# Patient Record
Sex: Female | Born: 1960 | Race: Black or African American | Hispanic: No | Marital: Married | Smoking: Never smoker
Health system: Southern US, Community
[De-identification: ages and names within clinical notes are randomized; demographics above are authoritative.]

## PROBLEM LIST (undated history)

## (undated) DIAGNOSIS — D649 Anemia, unspecified: Secondary | ICD-10-CM

## (undated) DIAGNOSIS — F419 Anxiety disorder, unspecified: Secondary | ICD-10-CM

## (undated) DIAGNOSIS — F32A Depression, unspecified: Secondary | ICD-10-CM

## (undated) DIAGNOSIS — I10 Essential (primary) hypertension: Secondary | ICD-10-CM

## (undated) DIAGNOSIS — M1990 Unspecified osteoarthritis, unspecified site: Secondary | ICD-10-CM

## (undated) DIAGNOSIS — F329 Major depressive disorder, single episode, unspecified: Secondary | ICD-10-CM

## (undated) DIAGNOSIS — E785 Hyperlipidemia, unspecified: Secondary | ICD-10-CM

## (undated) HISTORY — PX: LUMBAR LAMINECTOMY: SHX95

## (undated) HISTORY — DX: Depression, unspecified: F32.A

## (undated) HISTORY — PX: CHOLECYSTECTOMY: SHX55

## (undated) HISTORY — DX: Hyperlipidemia, unspecified: E78.5

## (undated) HISTORY — DX: Anemia, unspecified: D64.9

## (undated) HISTORY — DX: Anxiety disorder, unspecified: F41.9

## (undated) HISTORY — DX: Major depressive disorder, single episode, unspecified: F32.9

## (undated) HISTORY — DX: Unspecified osteoarthritis, unspecified site: M19.90

## (undated) HISTORY — DX: Essential (primary) hypertension: I10

---

## 2001-01-13 ENCOUNTER — Encounter: Payer: Self-pay | Admitting: Internal Medicine

## 2001-01-13 ENCOUNTER — Ambulatory Visit (HOSPITAL_COMMUNITY): Admission: RE | Admit: 2001-01-13 | Discharge: 2001-01-13 | Payer: Self-pay | Admitting: Internal Medicine

## 2001-03-23 ENCOUNTER — Encounter (INDEPENDENT_AMBULATORY_CARE_PROVIDER_SITE_OTHER): Payer: Self-pay

## 2001-03-23 ENCOUNTER — Ambulatory Visit (HOSPITAL_COMMUNITY): Admission: RE | Admit: 2001-03-23 | Discharge: 2001-03-23 | Payer: Self-pay | Admitting: Obstetrics and Gynecology

## 2001-03-23 ENCOUNTER — Encounter: Payer: Self-pay | Admitting: Obstetrics and Gynecology

## 2001-05-01 ENCOUNTER — Ambulatory Visit: Admission: RE | Admit: 2001-05-01 | Discharge: 2001-05-01 | Payer: Self-pay | Admitting: Internal Medicine

## 2001-08-01 ENCOUNTER — Emergency Department (HOSPITAL_COMMUNITY): Admission: EM | Admit: 2001-08-01 | Discharge: 2001-08-01 | Payer: Self-pay | Admitting: Emergency Medicine

## 2002-11-21 ENCOUNTER — Encounter: Payer: Self-pay | Admitting: Emergency Medicine

## 2002-11-21 ENCOUNTER — Emergency Department (HOSPITAL_COMMUNITY): Admission: EM | Admit: 2002-11-21 | Discharge: 2002-11-21 | Payer: Self-pay | Admitting: Emergency Medicine

## 2004-01-21 ENCOUNTER — Ambulatory Visit (HOSPITAL_COMMUNITY): Admission: RE | Admit: 2004-01-21 | Discharge: 2004-01-22 | Payer: Self-pay | Admitting: Neurosurgery

## 2004-05-22 ENCOUNTER — Ambulatory Visit: Payer: Self-pay | Admitting: Internal Medicine

## 2004-06-02 ENCOUNTER — Ambulatory Visit: Payer: Self-pay | Admitting: Internal Medicine

## 2004-09-17 ENCOUNTER — Emergency Department (HOSPITAL_COMMUNITY): Admission: EM | Admit: 2004-09-17 | Discharge: 2004-09-18 | Payer: Self-pay | Admitting: Emergency Medicine

## 2004-10-30 ENCOUNTER — Ambulatory Visit: Payer: Self-pay | Admitting: Internal Medicine

## 2004-11-09 ENCOUNTER — Ambulatory Visit: Payer: Self-pay | Admitting: Internal Medicine

## 2005-01-04 ENCOUNTER — Emergency Department (HOSPITAL_COMMUNITY): Admission: EM | Admit: 2005-01-04 | Discharge: 2005-01-04 | Payer: Self-pay | Admitting: Emergency Medicine

## 2005-01-06 ENCOUNTER — Ambulatory Visit: Payer: Self-pay | Admitting: Internal Medicine

## 2005-02-19 ENCOUNTER — Ambulatory Visit: Payer: Self-pay | Admitting: Internal Medicine

## 2005-02-26 ENCOUNTER — Ambulatory Visit: Payer: Self-pay | Admitting: Internal Medicine

## 2005-03-04 ENCOUNTER — Ambulatory Visit: Payer: Self-pay | Admitting: Internal Medicine

## 2005-09-22 ENCOUNTER — Ambulatory Visit: Payer: Self-pay | Admitting: Endocrinology

## 2005-09-27 ENCOUNTER — Ambulatory Visit: Payer: Self-pay | Admitting: Internal Medicine

## 2005-10-01 ENCOUNTER — Ambulatory Visit: Payer: Self-pay | Admitting: Internal Medicine

## 2005-11-08 ENCOUNTER — Ambulatory Visit: Payer: Self-pay | Admitting: Internal Medicine

## 2005-12-18 ENCOUNTER — Ambulatory Visit: Payer: Self-pay | Admitting: Family Medicine

## 2006-04-06 ENCOUNTER — Ambulatory Visit: Payer: Self-pay | Admitting: Internal Medicine

## 2006-07-25 ENCOUNTER — Ambulatory Visit: Payer: Self-pay | Admitting: Internal Medicine

## 2007-02-22 ENCOUNTER — Encounter: Payer: Self-pay | Admitting: Internal Medicine

## 2007-02-22 DIAGNOSIS — I10 Essential (primary) hypertension: Secondary | ICD-10-CM | POA: Insufficient documentation

## 2007-04-12 ENCOUNTER — Encounter: Payer: Self-pay | Admitting: Internal Medicine

## 2007-04-12 ENCOUNTER — Ambulatory Visit: Payer: Self-pay | Admitting: Internal Medicine

## 2007-04-12 DIAGNOSIS — F411 Generalized anxiety disorder: Secondary | ICD-10-CM | POA: Insufficient documentation

## 2007-04-12 DIAGNOSIS — D509 Iron deficiency anemia, unspecified: Secondary | ICD-10-CM | POA: Insufficient documentation

## 2007-04-12 DIAGNOSIS — F329 Major depressive disorder, single episode, unspecified: Secondary | ICD-10-CM

## 2007-04-12 DIAGNOSIS — J309 Allergic rhinitis, unspecified: Secondary | ICD-10-CM | POA: Insufficient documentation

## 2007-04-12 DIAGNOSIS — F32A Depression, unspecified: Secondary | ICD-10-CM | POA: Insufficient documentation

## 2007-04-12 DIAGNOSIS — F3289 Other specified depressive episodes: Secondary | ICD-10-CM | POA: Insufficient documentation

## 2007-04-12 LAB — CONVERTED CEMR LAB
ALT: 12 units/L (ref 0–35)
Basophils Absolute: 0 10*3/uL (ref 0.0–0.1)
Bilirubin, Direct: 0.1 mg/dL (ref 0.0–0.3)
CO2: 27 meq/L (ref 19–32)
Calcium: 9.4 mg/dL (ref 8.4–10.5)
Chloride: 107 meq/L (ref 96–112)
Eosinophils Absolute: 0.1 10*3/uL (ref 0.0–0.6)
Eosinophils Relative: 1.4 % (ref 0.0–5.0)
GFR calc Af Amer: 87 mL/min
Glucose, Bld: 113 mg/dL — ABNORMAL HIGH (ref 70–99)
Leukocytes, UA: NEGATIVE
MCHC: 31.9 g/dL (ref 30.0–36.0)
MCV: 68.1 fL — ABNORMAL LOW (ref 78.0–100.0)
Microalb Creat Ratio: 9.7 mg/g (ref 0.0–30.0)
Microalb, Ur: 1.7 mg/dL (ref 0.0–1.9)
Neutro Abs: 3.3 10*3/uL (ref 1.4–7.7)
Neutrophils Relative %: 62.2 % (ref 43.0–77.0)
Nitrite: NEGATIVE
Platelets: 327 10*3/uL (ref 150–400)
Potassium: 4 meq/L (ref 3.5–5.1)
RBC: 5.15 M/uL — ABNORMAL HIGH (ref 3.87–5.11)
RDW: 14.6 % (ref 11.5–14.6)
Specific Gravity, Urine: 1.02 (ref 1.000–1.03)
TSH: 1.71 microintl units/mL (ref 0.35–5.50)
Total Protein, Urine: NEGATIVE mg/dL
Triglycerides: 107 mg/dL (ref 0–149)
Urine Glucose: NEGATIVE mg/dL
Urobilinogen, UA: 0.2 (ref 0.0–1.0)

## 2007-07-26 ENCOUNTER — Ambulatory Visit: Payer: Self-pay | Admitting: Internal Medicine

## 2007-07-27 DIAGNOSIS — E785 Hyperlipidemia, unspecified: Secondary | ICD-10-CM | POA: Insufficient documentation

## 2007-08-15 ENCOUNTER — Encounter: Payer: Self-pay | Admitting: Internal Medicine

## 2007-11-03 ENCOUNTER — Encounter: Payer: Self-pay | Admitting: Internal Medicine

## 2008-01-01 ENCOUNTER — Ambulatory Visit: Payer: Self-pay | Admitting: Internal Medicine

## 2008-01-01 DIAGNOSIS — J019 Acute sinusitis, unspecified: Secondary | ICD-10-CM | POA: Insufficient documentation

## 2008-04-02 ENCOUNTER — Ambulatory Visit: Payer: Self-pay | Admitting: Internal Medicine

## 2008-04-02 DIAGNOSIS — R002 Palpitations: Secondary | ICD-10-CM | POA: Insufficient documentation

## 2008-04-03 ENCOUNTER — Encounter: Payer: Self-pay | Admitting: Internal Medicine

## 2008-04-05 ENCOUNTER — Telehealth (INDEPENDENT_AMBULATORY_CARE_PROVIDER_SITE_OTHER): Payer: Self-pay | Admitting: *Deleted

## 2008-04-18 ENCOUNTER — Encounter: Payer: Self-pay | Admitting: Internal Medicine

## 2008-04-18 ENCOUNTER — Ambulatory Visit: Payer: Self-pay

## 2009-02-06 ENCOUNTER — Ambulatory Visit: Payer: Self-pay | Admitting: Internal Medicine

## 2009-02-06 LAB — CONVERTED CEMR LAB
AST: 21 units/L (ref 0–37)
Basophils Absolute: 0 10*3/uL (ref 0.0–0.1)
Basophils Relative: 0.4 % (ref 0.0–3.0)
Bilirubin Urine: NEGATIVE
CO2: 28 meq/L (ref 19–32)
Calcium: 9.8 mg/dL (ref 8.4–10.5)
Creatinine,U: 112.5 mg/dL
Eosinophils Absolute: 0.1 10*3/uL (ref 0.0–0.7)
Glucose, Bld: 106 mg/dL — ABNORMAL HIGH (ref 70–99)
HCT: 34 % — ABNORMAL LOW (ref 36.0–46.0)
HDL: 43.2 mg/dL (ref >39.00)
Hemoglobin, Urine: NEGATIVE
Hemoglobin: 11.5 g/dL — ABNORMAL LOW (ref 12.0–15.0)
Ketones, ur: NEGATIVE mg/dL
Leukocytes, UA: NEGATIVE
Lymphocytes Relative: 33.1 % (ref 12.0–46.0)
Lymphs Abs: 1.8 10*3/uL (ref 0.7–4.0)
MCHC: 33.7 g/dL (ref 30.0–36.0)
MCV: 68.8 fL — ABNORMAL LOW (ref 78.0–100.0)
Microalb, Ur: 0.6 mg/dL (ref 0.0–1.9)
Neutro Abs: 3 10*3/uL (ref 1.4–7.7)
Nitrite: NEGATIVE
RBC: 4.94 M/uL (ref 3.87–5.11)
Sodium: 140 meq/L (ref 135–145)
Specific Gravity, Urine: 1.015 (ref 1.000–1.030)
Total Protein, Urine: NEGATIVE mg/dL
Triglycerides: 113 mg/dL (ref 0.0–149.0)
Urine Glucose: NEGATIVE mg/dL
VLDL: 22.6 mg/dL (ref 0.0–40.0)
WBC: 5.4 10*3/uL (ref 4.5–10.5)
pH: 6 (ref 5.0–8.0)

## 2009-10-20 ENCOUNTER — Ambulatory Visit: Payer: Self-pay | Admitting: Internal Medicine

## 2009-10-20 DIAGNOSIS — R350 Frequency of micturition: Secondary | ICD-10-CM | POA: Insufficient documentation

## 2009-10-20 LAB — CONVERTED CEMR LAB
BUN: 10 mg/dL (ref 6–23)
Chloride: 104 meq/L (ref 96–112)
Cholesterol: 203 mg/dL — ABNORMAL HIGH (ref 0–200)
Direct LDL: 137.4 mg/dL
GFR calc non Af Amer: 68.46 mL/min (ref >60)
Glucose, Bld: 89 mg/dL (ref 70–99)
HDL: 49.6 mg/dL (ref >39.00)
Hemoglobin, Urine: NEGATIVE
Ketones, ur: NEGATIVE mg/dL
Potassium: 3.9 meq/L (ref 3.5–5.1)
Total CHOL/HDL Ratio: 4
Total Protein, Urine: NEGATIVE mg/dL
Triglycerides: 143 mg/dL (ref 0.0–149.0)
Urine Glucose: NEGATIVE mg/dL
pH: 6.5 (ref 5.0–8.0)

## 2009-10-21 ENCOUNTER — Encounter: Payer: Self-pay | Admitting: Internal Medicine

## 2010-01-30 ENCOUNTER — Ambulatory Visit: Payer: Self-pay | Admitting: Internal Medicine

## 2010-01-31 LAB — CONVERTED CEMR LAB
BUN: 11 mg/dL (ref 6–23)
Basophils Absolute: 0 10*3/uL (ref 0.0–0.1)
Bilirubin, Direct: 0.1 mg/dL (ref 0.0–0.3)
CO2: 28 meq/L (ref 19–32)
Chloride: 104 meq/L (ref 96–112)
Cholesterol: 140 mg/dL (ref 0–200)
Creatinine, Ser: 1 mg/dL (ref 0.4–1.2)
Creatinine,U: 251.1 mg/dL
Eosinophils Absolute: 0.1 10*3/uL (ref 0.0–0.7)
Glucose, Bld: 109 mg/dL — ABNORMAL HIGH (ref 70–99)
HDL: 42.6 mg/dL (ref >39.00)
Hemoglobin: 11.3 g/dL — ABNORMAL LOW (ref 12.0–15.0)
Hgb A1c MFr Bld: 6.8 % — ABNORMAL HIGH (ref 4.6–6.5)
Ketones, ur: NEGATIVE mg/dL
Lymphs Abs: 1.6 10*3/uL (ref 0.7–4.0)
MCHC: 32.2 g/dL (ref 30.0–36.0)
Neutro Abs: 3.9 10*3/uL (ref 1.4–7.7)
Platelets: 340 10*3/uL (ref 150.0–400.0)
Potassium: 4.1 meq/L (ref 3.5–5.1)
RBC: 5.06 M/uL (ref 3.87–5.11)
TSH: 1.36 microintl units/mL (ref 0.35–5.50)
Total CHOL/HDL Ratio: 3
Total Protein: 7.5 g/dL (ref 6.0–8.3)
Triglycerides: 112 mg/dL (ref 0.0–149.0)
Urobilinogen, UA: 0.2 (ref 0.0–1.0)
WBC: 6 10*3/uL (ref 4.5–10.5)
pH: 5.5 (ref 5.0–8.0)

## 2010-08-02 LAB — CONVERTED CEMR LAB
Albumin: 4 g/dL (ref 3.5–5.2)
Alkaline Phosphatase: 84 units/L (ref 39–117)
BUN: 17 mg/dL (ref 6–23)
Bilirubin, Direct: 0.5 mg/dL — ABNORMAL HIGH (ref 0.0–0.3)
CO2: 26 meq/L (ref 19–32)
Calcium: 9.2 mg/dL (ref 8.4–10.5)
Chloride: 98 meq/L (ref 96–112)
Cholesterol: 238 mg/dL (ref 0–200)
Creatinine, Ser: 1.1 mg/dL (ref 0.4–1.2)
Crystals: NEGATIVE
Eosinophils Absolute: 0.1 10*3/uL (ref 0.0–0.7)
Eosinophils Relative: 1.7 % (ref 0.0–5.0)
Glucose, Bld: 78 mg/dL (ref 70–99)
HCT: 34.3 % — ABNORMAL LOW (ref 36.0–46.0)
Hemoglobin: 10.9 g/dL — ABNORMAL LOW (ref 12.0–15.0)
Hgb A1c MFr Bld: 6.6 % — ABNORMAL HIGH (ref 4.6–6.0)
Ketones, ur: NEGATIVE mg/dL
Leukocytes, UA: NEGATIVE
Lymphocytes Relative: 35.1 % (ref 12.0–46.0)
MCHC: 31.8 g/dL (ref 30.0–36.0)
MCV: 70.9 fL — ABNORMAL LOW (ref 78.0–100.0)
Microalb Creat Ratio: 10.8 mg/g (ref 0.0–30.0)
Monocytes Absolute: 0.6 10*3/uL (ref 0.1–1.0)
Neutro Abs: 4.2 10*3/uL (ref 1.4–7.7)
Platelets: 420 10*3/uL — ABNORMAL HIGH (ref 150–400)
Potassium: 4.1 meq/L (ref 3.5–5.1)
RBC: 4.83 M/uL (ref 3.87–5.11)
TSH: 1.74 microintl units/mL (ref 0.35–5.50)
Total CHOL/HDL Ratio: 6.3
Triglycerides: 274 mg/dL (ref 0–149)
Urine Glucose: NEGATIVE mg/dL

## 2010-08-06 NOTE — Assessment & Plan Note (Signed)
Summary: for meds/cd   Vital Signs:  Patient profile:   50 year old female Height:      66 inches Weight:      232.25 pounds BMI:     37.62 O2 Sat:      96 % on Room air Temp:     97.6 degrees F oral Pulse rate:   76 / minute BP sitting:   112 / 80  (left arm) Cuff size:   large  Vitals Entered By: Zella Ball Ewing CMA (AAMA) (January 30, 2010 9:05 AM)  O2 Flow:  Room air  Preventive Care Screening     declines tetanus and pnemonia shots  CC: Refills on medications/RE   CC:  Refills on medications/RE.  History of Present Illness: overall doing well;  no specific complaints;  Pt denies CP, sob, doe, wheezing, orthopnea, pnd, worsening LE edema, palps, dizziness or syncope   Pt denies new neuro symptoms such as headache, facial or extremity weakness   No fever, wt loss, night sweats, loss of appetite or other constitutional symptoms Overall good med compliance and tolerance.    Preventive Screening-Counseling & Management      Drug Use:  no.    Problems Prior to Update: 1)  Urinary Frequency  (ICD-788.41) 2)  Palpitations  (ICD-785.1) 3)  Preventive Health Care  (ICD-V70.0) 4)  Sinusitis- Acute-nos  (ICD-461.9) 5)  Hyperlipidemia  (ICD-272.4) 6)  Diabetes Mellitus, Type II  (ICD-250.00) 7)  Preventive Health Care  (ICD-V70.0) 8)  Family History Diabetes 1st Degree Relative  (ICD-V18.0) 9)  Anemia-nos  (ICD-285.9) 10)  Depression  (ICD-311) 11)  Allergic Rhinitis  (ICD-477.9) 12)  Anxiety  (ICD-300.00) 13)  Hypertension  (ICD-401.9)  Medications Prior to Update: 1)  Adult Aspirin Ec Low Strength 81 Mg  Tbec (Aspirin) .Marland Kitchen.. 1 By Mouth Qd 2)  Benazepril Hcl 40 Mg Tabs (Benazepril Hcl) .Marland Kitchen.. 1 Tablet By Mouth Once A Day 3)  Metformin Hcl 500 Mg Tabs (Metformin Hcl) .Marland Kitchen.. 1 By Mouth Once Daily 4)  Simvastatin 80 Mg Tabs (Simvastatin) .Marland Kitchen.. 1po Once Daily 5)  Amlodipine Besylate 5 Mg Tabs (Amlodipine Besylate) .Marland Kitchen.. 1po Once Daily  Current Medications (verified): 1)  Adult  Aspirin Ec Low Strength 81 Mg  Tbec (Aspirin) .Marland Kitchen.. 1 By Mouth Qd 2)  Benazepril Hcl 40 Mg Tabs (Benazepril Hcl) .Marland Kitchen.. 1 Tablet By Mouth Once A Day 3)  Metformin Hcl 500 Mg Tabs (Metformin Hcl) .Marland Kitchen.. 1 By Mouth Once Daily 4)  Simvastatin 40 Mg Tabs (Simvastatin) .Marland Kitchen.. 1po Once Daily 5)  Amlodipine Besylate 5 Mg Tabs (Amlodipine Besylate) .Marland Kitchen.. 1po Once Daily  Allergies (verified): 1)  ! 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         all otherwise negative per pt -    Physical Exam  General:  alert and overweight-appearing.   Head:  normocephalic and atraumatic.   Eyes:  vision grossly intact, pupils equal, and pupils round.   Ears:  R ear normal and L ear normal.   Nose:  no external deformity and  no nasal discharge.   Mouth:  no gingival abnormalities and pharynx pink and moist.   Neck:  supple and no masses.   Lungs:  normal respiratory effort and normal breath sounds.   Heart:  normal rate and regular rhythm.   Abdomen:  soft, non-tender, and normal bowel sounds.   Msk:  no joint tenderness and no joint swelling.   Extremities:  no edema, no erythema  Neurologic:  cranial nerves II-XII intact and strength normal in all extremities.   Skin:  color normal and no rashes.   Psych:  not anxious appearing and not depressed appearing.     Impression & Recommendations:  Problem # 1:  Preventive Health Care (ICD-V70.0)  Overall doing well, age appropriate education and counseling updated and referral for appropriate preventive services done unless declined, immunizations up to date or declined, diet counseling done if overweight, urged to quit smoking if smokes , most recent labs reviewed and current ordered if appropriate, ecg reviewed or declined (interpretation per ECG scanned in the EMR if done); information regarding Medicare Prevention requirements given if appropriate; speciality referrals updated as appropriate   Orders: TLB-BMP (Basic Metabolic Panel-BMET) (80048-METABOL) TLB-CBC Platelet - w/Differential (85025-CBCD) TLB-Hepatic/Liver Function Pnl (80076-HEPATIC) TLB-TSH (Thyroid Stimulating Hormone) (84443-TSH) TLB-Lipid Panel (80061-LIPID) TLB-Udip ONLY (81003-UDIP)  Problem # 2:  HYPERLIPIDEMIA (ICD-272.4)  Her updated medication list for this problem includes:    Simvastatin 40 Mg Tabs (Simvastatin) .Marland Kitchen... 1po once daily ok to decr to 40 mg due to high risk of 80 mg per FDA warning  Labs Reviewed: SGOT: 21 (02/06/2009)   SGPT: 13 (02/06/2009)   HDL:49.60 (10/20/2009), 43.20 (02/06/2009)  LDL:126 (02/06/2009), DEL (04/02/2008)  Chol:203 (10/20/2009), 192 (02/06/2009)  Trig:143.0 (10/20/2009), 113.0 (02/06/2009)  Problem # 3:  DIABETES MELLITUS, TYPE II  (ICD-250.00)  Her updated medication list for this problem includes:    Adult Aspirin Ec Low Strength 81 Mg Tbec (Aspirin) .Marland Kitchen... 1 by mouth qd    Benazepril Hcl 40 Mg Tabs (Benazepril hcl) .Marland Kitchen... 1 tablet by mouth once a day    Metformin Hcl 500 Mg Tabs (Metformin hcl) .Marland Kitchen... 1 by mouth once daily  Labs Reviewed: Creat: 1.1 (10/20/2009)    Reviewed HgBA1c results: 6.4 (10/20/2009)  6.3 (02/06/2009)  Orders: TLB-Microalbumin/Creat Ratio, Urine (82043-MALB) TLB-A1C / Hgb A1C (Glycohemoglobin) (83036-A1C) Pt to cont DM diet, excercise, wt loss efforts; to check labs today   Problem # 4:  HYPERTENSION (ICD-401.9)  Her updated medication list for this problem includes:    Benazepril Hcl 40 Mg Tabs (Benazepril hcl) .Marland Kitchen... 1 tablet by mouth once a day    Amlodipine Besylate 5 Mg Tabs (Amlodipine besylate) .Marland Kitchen... 1po once daily  BP today: 112/80 Prior BP: 150/98 (10/20/2009)  Labs Reviewed: K+: 3.9 (10/20/2009) Creat: : 1.1 (10/20/2009)   Chol: 203 (10/20/2009)   HDL: 49.60 (10/20/2009)   LDL: 126 (02/06/2009)   TG: 143.0 (10/20/2009) stable overall by hx and exam, ok to continue meds/tx as is - improved, good complaicne  Complete Medication List: 1)  Adult Aspirin  Ec Low Strength 81 Mg Tbec (Aspirin) .Marland Kitchen.. 1 by mouth qd 2)  Benazepril Hcl 40 Mg Tabs (Benazepril hcl) .Marland Kitchen.. 1 tablet by mouth once a day 3)  Metformin Hcl 500 Mg Tabs (Metformin hcl) .Marland Kitchen.. 1 by mouth once daily 4)  Simvastatin 40 Mg Tabs (Simvastatin) .Marland Kitchen.. 1po once daily 5)  Amlodipine Besylate 5 Mg Tabs (Amlodipine besylate) .Marland Kitchen.. 1po once daily  Patient Instructions: 1)  Please call for your yearlyl mammogram - consider [facility] on [address], or [facility] on [address] 2)  Please go to the Lab in the basement for your blood and/or urine tests today 3)  You are given the refills today 4)  Please schedule a follow-up appointment in 6 months with: 5)  BMP prior to visit, ICD-9: 250.02 6)  Lipid Panel prior to visit,  ICD-9: 7)  HbgA1C prior to visit, ICD-9: Prescriptions: AMLODIPINE BESYLATE 5 MG TABS (AMLODIPINE BESYLATE) 1po once daily  #90 x 3   Entered and Authorized by:   Corwin Levins MD   Signed by:   Corwin Levins MD on 01/30/2010   Method used:   Print then Give to Patient   RxID:   [id number] SIMVASTATIN 40 MG TABS (SIMVASTATIN) 1po once daily  #90 x 3   Entered and Authorized by:   Corwin Levins MD   Signed by:   Corwin Levins MD on 01/30/2010   Method used:   Print then Give to Patient   RxID:   [id number] METFORMIN HCL 500 MG TABS (METFORMIN HCL) 1 by mouth once daily  #90 x 3   Entered and Authorized by:   Corwin Levins MD   Signed by:   Corwin Levins MD on 01/30/2010   Method used:   Print then Give to Patient   RxID:   [id number] BENAZEPRIL HCL 40 MG TABS (BENAZEPRIL HCL) 1 tablet by mouth once a day  #90 x 3   Entered and Authorized by:   Corwin Levins MD   Signed by:   Corwin Levins MD on 01/30/2010   Method used:   Print then Give to Patient   RxID:   [id number]

## 2010-08-06 NOTE — Assessment & Plan Note (Signed)
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  History of Present Illness: feeling sluggish all wk; increased stress overall recently with family;  BP at work even higher most times than today over the past day or two several times;  Pt denies CP, sob, doe, wheezing, orthopnea, pnd, worsening LE edema, palps, dizziness or syncope  Pt denies new neuro symptoms such as headache, facial or extremity weakness  No other acute symptoms, such as fever, , n/v or other pain.  Trying to follow lower sodium diet, wt up only 4 lbs, states she does not get enough excercise.  Pt denies polydipsia,, or low sugar symptoms such as shakiness improved with eating.  Overall good compliance with meds, trying to follow low chol, DM diet, little excercise however .  Has had some urinary frequency recently but not sure if b/c maybe drinking more fluids.    Problems Prior to Update: 1)  Urinary Frequency  (ICD-788.41) 2)  Palpitations  (ICD-785.1) 3)  Preventive Health Care  (ICD-V70.0) 4)  Sinusitis- Acute-nos  (ICD-461.9) 5)  Hyperlipidemia  (ICD-272.4) 6)  Diabetes Mellitus, Type II  (ICD-250.00) 7)  Preventive Health Care  (ICD-V70.0) 8)  Family History Diabetes 1st Degree Relative  (ICD-V18.0) 9)  Anemia-nos  (ICD-285.9) 10)  Depression  (ICD-311) 11)  Allergic Rhinitis  (ICD-477.9) 12)  Anxiety  (ICD-300.00) 13)  Hypertension  (ICD-401.9)  Medications Prior to Update: 1)  Adult Aspirin Ec Low Strength 81 Mg  Tbec (Aspirin) .Marland Kitchen.. 1 By Mouth Qd 2)  Benazepril Hcl 40 Mg Tabs (Benazepril Hcl) .Marland Kitchen.. 1 Tablet By Mouth Once A  Day 3)  Metformin Hcl 500 Mg Tabs (Metformin Hcl) .Marland Kitchen.. 1 By Mouth Once Daily 4)  Simvastatin 80 Mg Tabs (Simvastatin) .Marland Kitchen.. 1po Once Daily  Current Medications (verified): 1)  Adult Aspirin Ec Low Strength 81 Mg  Tbec (Aspirin) .Marland Kitchen.. 1 By Mouth Qd 2)  Benazepril Hcl 40 Mg Tabs (Benazepril Hcl) .Marland Kitchen.. 1 Tablet By Mouth Once A Day 3)  Metformin Hcl 500 Mg Tabs (Metformin Hcl) .Marland Kitchen.. 1 By Mouth Once Daily 4)  Simvastatin 80 Mg Tabs (Simvastatin) .Marland Kitchen.. 1po Once Daily 5)  Amlodipine Besylate 5 Mg Tabs (Amlodipine Besylate) .Marland Kitchen.. 1po Once Daily  Allergies (verified): 1)  ! * Creswtor  Past History:  Past Medical History: Last updated: 07/26/2007 Hypertension hypercholesterolemia Anxiety Allergic rhinitis Depression Anemia-NOS Diabetes mellitus, type II Hyperlipidemia  Past Surgical History: Last updated: 04/12/2007 Cholecystectomy Lumbar laminectomy  Social History: Last updated: 10/20/2009 Never Smoked Alcohol use-no Married no children work - Engineer, [employer] - Corporate treasurer  Risk Factors: Smoking Status: never (04/12/2007)  Social History: Reviewed history from 04/02/2008 and no changes required. Never Smoked Alcohol use-no Married no children work - Engineer, [employer] - Corporate treasurer  Review of Systems       all otherwise negative per pt -    Physical Exam  General:  alert  and overweight-appearing.   Head:  normocephalic and atraumatic.   Eyes:  vision grossly intact, pupils equal, and pupils round.   Ears:  R ear normal and L ear normal.   Nose:  no external deformity and no nasal discharge.   Mouth:  no gingival abnormalities and pharynx pink and moist.   Neck:  supple and no masses.   Lungs:  normal respiratory effort and normal breath sounds.   Heart:  normal rate and regular rhythm.   Extremities:  no edema, no erythema    Impression & Recommendations:  Problem # 1:  HYPERTENSION (ICD-401.9)  Her updated medication list for this problem  includes:    Benazepril Hcl 40 Mg Tabs (Benazepril hcl) .Marland Kitchen... 1 tablet by mouth once a day    Amlodipine Besylate 5 Mg Tabs (Amlodipine besylate) .Marland Kitchen... 1po once daily add the amlodipine as above, treat as above, f/u any worsening signs or symptoms , f/u next visit and at work in the next few wks  Problem # 2:  DIABETES MELLITUS, TYPE II (ICD-250.00)  Her updated medication list for this problem includes:    Adult Aspirin Ec Low Strength 81 Mg Tbec (Aspirin) .Marland Kitchen... 1 by mouth qd    Benazepril Hcl 40 Mg Tabs (Benazepril hcl) .Marland Kitchen... 1 tablet by mouth once a day    Metformin Hcl 500 Mg Tabs (Metformin hcl) .Marland Kitchen... 1 by mouth once daily  Orders: TLB-Lipid Panel (80061-LIPID) TLB-BMP (Basic Metabolic Panel-BMET) (80048-METABOL) TLB-A1C / Hgb A1C (Glycohemoglobin) (83036-A1C)  Labs Reviewed: Creat: 1.0 (02/06/2009)    Reviewed HgBA1c results: 6.3 (02/06/2009)  6.6 (04/02/2008) stable overall by hx and exam, ok to continue meds/tx as is   Problem # 3:  URINARY FREQUENCY (ICD-788.41) unclear etiology - diff includes increased oral fluids, DM out of control, OAB, or cystitis - to check urine studies and labs above, tx pending any results  Orders: T-Culture, Urine ([id number]) TLB-Udip w/ Micro (81001-URINE)  Complete Medication List: 1)  Adult Aspirin Ec Low Strength 81 Mg Tbec (Aspirin) .Marland Kitchen.. 1 by mouth qd 2)  Benazepril Hcl 40 Mg Tabs (Benazepril hcl) .Marland Kitchen.. 1 tablet by mouth once a day 3)  Metformin Hcl 500 Mg Tabs (Metformin hcl) .Marland Kitchen.. 1 by mouth once daily 4)  Simvastatin 80 Mg Tabs (Simvastatin) .Marland Kitchen.. 1po once daily 5)  Amlodipine Besylate 5 Mg Tabs (Amlodipine besylate) .Marland Kitchen.. 1po once daily  Patient Instructions: 1)  Please take all new medications as prescribed (your new prescription was sent to the pharmacy) 2)  Continue all previous medications as before this visit  3)  Please go to the Lab in the basement for your blood and/or urine tests today 4)  Please call with your Blood  Pressures at work in  3 to 4 wks 5)  Please schedule a follow-up appointment in 4 months with CPX labs and : 6)  HbgA1C prior to visit, ICD-9: 250.02 7)  Urine Microalbumin prior to visit, ICD-9: Prescriptions: AMLODIPINE BESYLATE 5 MG TABS (AMLODIPINE BESYLATE) 1po once daily  #30 x 11   Entered and Authorized by:   Corwin Levins MD   Signed by:   Corwin Levins MD on 10/20/2009   Method used:   Electronically to        [facility]  [address] [id number] (retail)       [address]       [place], [place]  [address]       Ph: [phone] or  [phone]       Fax: [phone]   RxID:   [id number]

## 2010-11-20 NOTE — Op Note (Signed)
NAMECleta Daniels                             ACCOUNT NO.:  [id number]   MEDICAL RECORD NO.:  [id number]                   PATIENT TYPE:  OIB   LOCATION:  2899                                 FACILITY:  [facility]   PHYSICIAN:  Clydene Fake, M.D.               DATE OF BIRTH:  [birth year]   DATE OF PROCEDURE:  01/21/2004  DATE OF DISCHARGE:                                 OPERATIVE REPORT   PREOPERATIVE DIAGNOSES:  Herniated nucleus pulposus, with spondylosis right  L5-S1.   POSTOPERATIVE DIAGNOSES:  Herniated nucleus pulposus, with spondylosis right  L5-S1.   PROCEDURE:  Right L5-S1 semi-hemilaminectomy and diskectomy, foraminotomy  over the L5 root also performed microdissection with the microscope.   SURGEON:  Clydene Fake, M.D.   ASSISTANT:  Coletta Memos, M.D.   ANESTHESIA:  General endotracheal tube anesthesia.   ESTIMATED BLOOD LOSS:  Minimal.   BLOOD REPLACED:  None.   DRAINS:  None.   COMPLICATIONS:  None.   INDICATIONS FOR PROCEDURE:  The patient is a 50 year old woman who has been  having back and right leg pain and numbness running down the leg, but  especially from the knee down into the top of the foot on the big toe side.  It has not been getting any better.  An MRI was done, showing degenerative  disease, spondylitic changes and disk herniation at L5-S1, with more right  side compression and some foraminal narrowing on that side.  It could be  compressing the L5 or S1 root and causing her problem.  The patient is  brought in for a decompression of that level on the right side.   DESCRIPTION OF PROCEDURE:  The patient is brought to the operating room and  general anesthesia induced.  The patient was placed in the prone position in  a Wilson frame with all pressure points padded.  The patient was prepped and  draped in a sterile fashion.  The level of incision was injected with 10 mL  of 1% lidocaine with epinephrine.  The needle was placed into the  interspace.  X-rays were obtained showing that this was pointing at the L5-  S1 interspace.  An incision was then made and centered over the needle  wound.  The incision was taken down to the fascia.  The patient is obese,  and we had to make a very deep incision down to the fascia.  The fascia was  incised to the spinous process.  A subperiosteal dissection was done.  A  marker was placed at the interspace.  Another x-ray was obtained, showing  that this was the L4-5 interspace.  We then increased our fascial opening  down one level.  A subperiosteal dissection over the L5 and S1 spinous  process of the lamina up to the facet.  A self-retaining retractor placed at  the marker and then at that interspace took  another x-ray, confirming that  we were at the L5-S1 interspace.  A high-speed drill was used for the  decompressive hemi-laminectomy and medial facetectomy.  The wounds were then  completed with Kerrison punches.  The microscope was brought in for micro-  dissection at this point.  The Kerrison punch was used to continue the  facetectomy and the foraminotomy over the L5 root, and the ligament of  flavum was removed, __________ in the central canal of the S1 nerve root.  We explored the interspace and found the disk space, and a large broad-based  disk bulge was seen.  The disk space was incised with a #15 blade, and a  diskectomy performed with pituitary rongeurs and curets.  We dissected both  medially and laterally, and used an osteophyte remover to remove cephalad  and caudal osteophytes from  around the disk space.  When were finished, we  had a good decompression of the central canal at the S1 nerve root and the  L5 root.  Again we made sure we did decompress the cephalic epidural facet  over the L5 root, along with the disk bulge underneath it.  When we were  finished, we had good decompression of the L5 root.  Hemostasis was obtained  with cauterization and Gelfoam and thrombin.   Gelfoam was irrigated out.  The wound was irrigated with antibiotic solution.  The retractors were  removed.  The fascia was closed with #0 Vicryl interrupted sutures, and the  subcutaneous tissue was closed with #0, #2-0 and #3-0 Vicryl interrupted  suture, and the skin was closed with Benzoin and Steri-Strips.  A dressing  was placed.  The patient was placed back in the supine position, awakened from  anesthesia, and transferred to the recovery room in stable condition.                                               Clydene Fake, M.D.    JRH/MEDQ  D:  01/21/2004  T:  01/21/2004  Job:  [id number]

## 2010-11-20 NOTE — Op Note (Signed)
[facility]  Patient:    Jessica Daniels Visit Number: [id number] MRN: [id number]          Service Type: DSU Location: DAY Attending Physician:  Lendon Colonel Proc. Date: 03/23/01 Admit Date:  03/23/2001                             Operative Report  PREOPERATIVE DIAGNOSIS:  Menorrhagia.  POSTOPERATIVE DIAGNOSES:  Menorrhagia.  OPERATION:  Hysteroscopy with resection.  DESCRIPTION OF PROCEDURE:  The patient was placed in lithotomy position and prepped and draped in the usual fashion.  Cervix carefully dilated.  The hysteroscope was inserted in the endometrial cavity.  The endometrial cavity was smooth.  It had some granular changes on each side.  The endometrial cavity then was resected, and all of the resected material was sent to the lab for study.  There was no unusual blood loss.  She was awakened and carried to the recovery room. Attending Physician:  Lendon Colonel DD:  03/23/01 TD:  03/23/01 Job: [id number]

## 2011-01-21 ENCOUNTER — Encounter: Payer: Self-pay | Admitting: Internal Medicine

## 2011-01-22 ENCOUNTER — Encounter: Payer: Self-pay | Admitting: Internal Medicine

## 2011-01-22 DIAGNOSIS — Z0000 Encounter for general adult medical examination without abnormal findings: Secondary | ICD-10-CM | POA: Insufficient documentation

## 2011-01-22 DIAGNOSIS — Z0001 Encounter for general adult medical examination with abnormal findings: Secondary | ICD-10-CM | POA: Insufficient documentation

## 2011-01-25 ENCOUNTER — Ambulatory Visit (INDEPENDENT_AMBULATORY_CARE_PROVIDER_SITE_OTHER): Payer: BC Managed Care – PPO | Admitting: Internal Medicine

## 2011-01-25 ENCOUNTER — Encounter: Payer: Self-pay | Admitting: Internal Medicine

## 2011-01-25 ENCOUNTER — Other Ambulatory Visit (INDEPENDENT_AMBULATORY_CARE_PROVIDER_SITE_OTHER): Payer: BC Managed Care – PPO

## 2011-01-25 DIAGNOSIS — Z0000 Encounter for general adult medical examination without abnormal findings: Secondary | ICD-10-CM

## 2011-01-25 DIAGNOSIS — E119 Type 2 diabetes mellitus without complications: Secondary | ICD-10-CM

## 2011-01-25 LAB — HEPATIC FUNCTION PANEL
ALT: 18 U/L (ref 0–35)
Bilirubin, Direct: 0.1 mg/dL (ref 0.0–0.3)
Total Bilirubin: 0.3 mg/dL (ref 0.3–1.2)
Total Protein: 8.7 g/dL — ABNORMAL HIGH (ref 6.0–8.3)

## 2011-01-25 LAB — CBC WITH DIFFERENTIAL/PLATELET
Basophils Absolute: 0 10*3/uL (ref 0.0–0.1)
Basophils Relative: 0.4 % (ref 0.0–3.0)
Hemoglobin: 12.3 g/dL (ref 12.0–15.0)
MCHC: 31.6 g/dL (ref 30.0–36.0)
MCV: 71.5 fl — ABNORMAL LOW (ref 78.0–100.0)
Monocytes Absolute: 0.5 10*3/uL (ref 0.1–1.0)
Monocytes Relative: 8.2 % (ref 3.0–12.0)

## 2011-01-25 LAB — URINALYSIS, ROUTINE W REFLEX MICROSCOPIC
Bilirubin Urine: NEGATIVE
Ketones, ur: NEGATIVE
Urine Glucose: NEGATIVE
pH: 6 (ref 5.0–8.0)

## 2011-01-25 LAB — BASIC METABOLIC PANEL
BUN: 8 mg/dL (ref 6–23)
CO2: 25 mEq/L (ref 19–32)
Chloride: 99 mEq/L (ref 96–112)
Glucose, Bld: 115 mg/dL — ABNORMAL HIGH (ref 70–99)
Sodium: 135 mEq/L (ref 135–145)

## 2011-01-25 LAB — MICROALBUMIN / CREATININE URINE RATIO
Microalb Creat Ratio: 1.7 mg/g (ref 0.0–30.0)
Microalb, Ur: 4 mg/dL — ABNORMAL HIGH (ref 0.0–1.9)

## 2011-01-25 LAB — HEMOGLOBIN A1C: Hgb A1c MFr Bld: 7.8 % — ABNORMAL HIGH (ref 4.6–6.5)

## 2011-01-25 LAB — LIPID PANEL
Cholesterol: 161 mg/dL (ref 0–200)
VLDL: 25.6 mg/dL (ref 0.0–40.0)

## 2011-01-25 MED ORDER — BENAZEPRIL HCL 40 MG PO TABS: 40.0000 mg | ORAL_TABLET | Freq: Every day | ORAL | Status: DC

## 2011-01-25 MED ORDER — SIMVASTATIN 40 MG PO TABS: 20.0000 mg | ORAL_TABLET | Freq: Every day | ORAL | Status: DC

## 2011-01-25 MED ORDER — GLUCOSE BLOOD VI STRP: ORAL_STRIP | Status: AC

## 2011-01-25 MED ORDER — METFORMIN HCL 500 MG PO TABS: 500.0000 mg | ORAL_TABLET | Freq: Every day | ORAL | Status: DC

## 2011-01-25 MED ORDER — AMLODIPINE BESYLATE 5 MG PO TABS: 5.0000 mg | ORAL_TABLET | Freq: Every day | ORAL | Status: DC

## 2011-01-25 MED ORDER — ONETOUCH ULTRASOFT LANCETS MISC: Status: AC

## 2011-01-25 NOTE — Patient Instructions (Signed)
Continue all other medications as before Your refills will be sent in, as well as the supplies for the new glucometer Please go to LAB in the Basement for the blood and/or urine tests to be done today Please call the phone number [phone] (the [employer] System) for results of testing in 2-3 days;  When calling, simply dial the number, and when prompted enter the MRN number above (the Medical Record Number) and the # key, then the message should start. Please return in 6 mo with Lab testing done 3-5 days before

## 2011-01-25 NOTE — Assessment & Plan Note (Signed)

## 2011-01-25 NOTE — Progress Notes (Signed)
Subjective:    Patient ID: Jessica Daniels, female    DOB: 11-Sep-1960, 50 y.o.   MRN: [id number]  HPI Here for wellness and f/u;  Overall doing ok;  Pt denies CP, worsening SOB, DOE, wheezing, orthopnea, PND, worsening LE edema, palpitations, dizziness or syncope.  Pt denies neurological change such as new Headache, facial or extremity weakness.  Pt denies polydipsia, polyuria, or low sugar symptoms. Pt states overall good compliance with treatment and medications, good tolerability, and trying to follow lower cholesterol diet.  Pt denies worsening depressive symptoms, suicidal ideation or panic. No fever, wt loss, night sweats, loss of appetite, or other constitutional symptoms.  Pt states good ability with ADL's, low fall risk, home safety reviewed and adequate, no significant changes in hearing or vision, and occasionally active with exercise.  No other acute complaints.  Asks for new glucomter and supplied Past Medical History  Diagnosis Date  . Hypertension   . Hyperlipidemia   . Anxiety   . Depression   . Anemia   . Diabetes mellitus   . Allergic rhinitis    Past Surgical History  Procedure Date  . Cholecystectomy   . Lumbar laminectomy     reports that she has never smoked. She does not have any smokeless tobacco history on file. She reports that she does not drink alcohol or use illicit drugs. family history includes Diabetes in an unspecified family member. No Known Allergies Current Outpatient Prescriptions on File Prior to Visit  Medication Sig Dispense Refill  . amLODipine (NORVASC) 5 MG tablet Take 5 mg by mouth daily.        . benazepril (LOTENSIN) 40 MG tablet Take 40 mg by mouth daily.        . metFORMIN (GLUCOPHAGE) 500 MG tablet Take 500 mg by mouth daily.        . simvastatin (ZOCOR) 40 MG tablet Take 40 mg by mouth daily.        Marland Kitchen aspirin 81 MG EC tablet Take 81 mg by mouth daily.         Review of Systems Review of Systems  Constitutional: Negative for  diaphoresis, activity change, appetite change and unexpected weight change.  HENT: Negative for hearing loss, ear pain, facial swelling, mouth sores and neck stiffness.   Eyes: Negative for pain, redness and visual disturbance.  Respiratory: Negative for shortness of breath and wheezing.   Cardiovascular: Negative for chest pain and palpitations.  Gastrointestinal: Negative for diarrhea, blood in stool, abdominal distention and rectal pain.  Genitourinary: Negative for hematuria, flank pain and decreased urine volume.  Musculoskeletal: Negative for myalgias and joint swelling.  Skin: Negative for color change and wound.  Neurological: Negative for syncope and numbness.  Hematological: Negative for adenopathy.  Psychiatric/Behavioral: Negative for hallucinations, self-injury, decreased concentration and agitation.      Objective:   Physical Exam BP 102/70  Pulse 70  Temp(Src) 98.5 F (36.9 C) (Oral)  Ht 5\' 7"  (1.702 m)  Wt 239 lb 6 oz (108.58 kg)  BMI 37.49 kg/m2  SpO2 93%  LMP 12/29/2010 Physical Exam  VS noted Constitutional: Pt is oriented to person, place, and time. Appears well-developed and well-nourished.  HENT:  Head: Normocephalic and atraumatic.  Right Ear: External ear normal.  Left Ear: External ear normal.  Nose: Nose normal.  Mouth/Throat: Oropharynx is clear and moist.  Eyes: Conjunctivae and EOM are normal. Pupils are equal, round, and reactive to light.  Neck: Normal range of motion. Neck supple. No  JVD present. No tracheal deviation present.  Cardiovascular: Normal rate, regular rhythm, normal heart sounds and intact distal pulses.   Pulmonary/Chest: Effort normal and breath sounds normal.  Abdominal: Soft. Bowel sounds are normal. There is no tenderness.  Musculoskeletal: Normal range of motion. Exhibits no edema.  Lymphadenopathy:  Has no cervical adenopathy.  Neurological: Pt is alert and oriented to person, place, and time. Pt has normal reflexes. No  cranial nerve deficit.  Skin: Skin is warm and dry. No rash noted.  Psychiatric:  Has  normal mood and affect. Behavior is normal. 1+ nervous       Assessment & Plan:

## 2011-01-25 NOTE — Assessment & Plan Note (Signed)
stable overall by hx and exam, most recent data reviewed with pt, and pt to continue medical treatment as before  Lab Results  Component Value Date   HGBA1C 6.8* 01/30/2010

## 2011-01-26 ENCOUNTER — Other Ambulatory Visit: Payer: Self-pay | Admitting: Internal Medicine

## 2011-01-26 MED ORDER — METFORMIN HCL 500 MG PO TABS: 500.0000 mg | ORAL_TABLET | Freq: Two times a day (BID) | ORAL | Status: DC

## 2011-08-30 ENCOUNTER — Encounter: Payer: Self-pay | Admitting: Internal Medicine

## 2011-08-30 ENCOUNTER — Ambulatory Visit (INDEPENDENT_AMBULATORY_CARE_PROVIDER_SITE_OTHER): Payer: BC Managed Care – PPO | Admitting: Internal Medicine

## 2011-08-30 ENCOUNTER — Other Ambulatory Visit (INDEPENDENT_AMBULATORY_CARE_PROVIDER_SITE_OTHER): Payer: BC Managed Care – PPO

## 2011-08-30 VITALS — BP 130/86 | HR 76 | Temp 97.0°F | Ht 67.0 in | Wt 237.2 lb

## 2011-08-30 DIAGNOSIS — I10 Essential (primary) hypertension: Secondary | ICD-10-CM

## 2011-08-30 DIAGNOSIS — Z0000 Encounter for general adult medical examination without abnormal findings: Secondary | ICD-10-CM

## 2011-08-30 DIAGNOSIS — E785 Hyperlipidemia, unspecified: Secondary | ICD-10-CM

## 2011-08-30 DIAGNOSIS — J209 Acute bronchitis, unspecified: Secondary | ICD-10-CM

## 2011-08-30 DIAGNOSIS — E119 Type 2 diabetes mellitus without complications: Secondary | ICD-10-CM

## 2011-08-30 MED ORDER — LEVOFLOXACIN 250 MG PO TABS: 250.0000 mg | ORAL_TABLET | Freq: Every day | ORAL | Status: AC

## 2011-08-30 MED ORDER — HYDROCODONE-HOMATROPINE 5-1.5 MG/5ML PO SYRP: 5.0000 mL | ORAL_SOLUTION | Freq: Four times a day (QID) | ORAL | Status: AC | PRN

## 2011-08-30 NOTE — Progress Notes (Signed)
  Subjective:    Patient ID: Jessica Daniels, female    DOB: 1961/02/05, 51 y.o.   MRN: [id number]  HPI  Here with acute onset mild to mod 2-3 days ST, HA, general weakness and malaise, with prod cough greenish sputum, but Pt denies chest pain, increased sob or doe, wheezing, orthopnea, PND, increased LE swelling, palpitations, dizziness or syncope. .  Pt denies new neurological symptoms such as new headache, or facial or extremity weakness or numbness   Pt denies polydipsia, polyuria, or low sugar symptoms such as weakness or confusion improved with po intake.  Pt states overall good compliance with meds, trying to follow lower cholesterol, diabetic diet, wt overall stable but little exercise however. No other acute compalints Past Medical History  Diagnosis Date  . Hypertension   . Hyperlipidemia   . Anxiety   . Depression   . Anemia   . Diabetes mellitus   . Allergic rhinitis    Past Surgical History  Procedure Date  . Cholecystectomy   . Lumbar laminectomy     reports that she has never smoked. She does not have any smokeless tobacco history on file. She reports that she does not drink alcohol or use illicit drugs. family history includes Diabetes in an unspecified family member. No Known Allergies Current Outpatient Prescriptions on File Prior to Visit  Medication Sig Dispense Refill  . amLODipine (NORVASC) 5 MG tablet Take 1 tablet (5 mg total) by mouth daily.  90 tablet  3  . aspirin 81 MG EC tablet Take 81 mg by mouth daily.        . benazepril (LOTENSIN) 40 MG tablet Take 1 tablet (40 mg total) by mouth daily.  90 tablet  3  . glucose blood test strip Use as instructed  100 each  12  . Lancets (ONETOUCH ULTRASOFT) lancets Use as directed once daily  100 each  5  . metFORMIN (GLUCOPHAGE) 500 MG tablet Take 1 tablet (500 mg total) by mouth 2 (two) times daily with a meal.  180 tablet  3  . simvastatin (ZOCOR) 40 MG tablet Take 0.5 tablets (20 mg total) by mouth daily.  45 tablet   3   Review of Systems Review of Systems  Constitutional: Negative for diaphoresis and unexpected weight change.  HENT: Negative for drooling and tinnitus.   Eyes: Negative for photophobia and visual disturbance.  Respiratory: Negative for choking and stridor.   Gastrointestinal: Negative for vomiting and blood in stool.  Genitourinary: Negative for hematuria and decreased urine volume.       Objective:   Physical Exam BP 130/86  Pulse 76  Temp(Src) 97 F (36.1 C) (Oral)  Ht 5\' 7"  (1.702 m)  Wt 237 lb 4 oz (107.616 kg)  BMI 37.16 kg/m2  SpO2 97% Physical Exam  VS noted, obese, mild ill Constitutional: Pt appears well-developed and well-nourished.  HENT: Head: Normocephalic.  Right Ear: External ear normal.  Left Ear: External ear normal.  Bilat tm's mild erythema.  Sinus nontender.  Pharynx mild erythema Eyes: Conjunctivae and EOM are normal. Pupils are equal, round, and reactive to light.  Neck: Normal range of motion. Neck supple.  Cardiovascular: Normal rate and regular rhythm.   Pulmonary/Chest: Effort normal and breath sounds normal.  Neurological: Pt is alert. No cranial nerve deficit.  Skin: Skin is warm. No erythema.  Psychiatric: Pt behavior is normal. Thought content normal.     Assessment & Plan:

## 2011-08-30 NOTE — Patient Instructions (Addendum)
Take all new medications as prescribed Continue all other medications as before Please go to LAB in the Basement for the blood and/or urine tests to be done today Please call the phone number [phone] (the [employer] System) for results of testing in 2-3 days;  When calling, simply dial the number, and when prompted enter the MRN number above (the Medical Record Number) and the # key, then the message should start. Please return in 6 mo with Lab testing done 3-5 days before  

## 2011-08-30 NOTE — Assessment & Plan Note (Signed)
Mild to mod, for antibx course,  to f/u any worsening symptoms or concerns 

## 2011-08-30 NOTE — Assessment & Plan Note (Signed)
stable overall by hx and exam, most recent data reviewed with pt, and pt to continue medical treatment as before  Lab Results  Component Value Date   HGBA1C 7.8* 01/25/2011

## 2011-08-30 NOTE — Assessment & Plan Note (Signed)
stable overall by hx and exam, most recent data reviewed with pt, and pt to continue medical treatment as before  BP Readings from Last 3 Encounters:  08/30/11 130/86  01/25/11 102/70  01/30/10 112/80

## 2011-08-30 NOTE — Assessment & Plan Note (Signed)
stable overall by hx and exam, most recent data reviewed with pt, and pt to continue medical treatment as before  Lab Results  Component Value Date   LDLCALC 89 01/25/2011

## 2011-08-31 ENCOUNTER — Other Ambulatory Visit: Payer: BC Managed Care – PPO

## 2011-08-31 LAB — LIPID PANEL
HDL: 47 mg/dL (ref >39.00)
LDL Cholesterol: 90 mg/dL (ref 0–99)
Total CHOL/HDL Ratio: 3
VLDL: 24.4 mg/dL (ref 0.0–40.0)

## 2011-08-31 LAB — HEMOGLOBIN A1C: Hgb A1c MFr Bld: 7.7 % — ABNORMAL HIGH (ref 4.6–6.5)

## 2011-08-31 LAB — BASIC METABOLIC PANEL
Calcium: 9.4 mg/dL (ref 8.4–10.5)
GFR: 84.54 mL/min (ref >60.00)
Glucose, Bld: 130 mg/dL — ABNORMAL HIGH (ref 70–99)
Sodium: 139 mEq/L (ref 135–145)

## 2011-09-01 ENCOUNTER — Other Ambulatory Visit: Payer: Self-pay | Admitting: Internal Medicine

## 2011-09-01 MED ORDER — METFORMIN HCL 1000 MG PO TABS: 1000.0000 mg | ORAL_TABLET | Freq: Two times a day (BID) | ORAL | Status: DC

## 2012-01-31 ENCOUNTER — Other Ambulatory Visit: Payer: Self-pay

## 2012-01-31 MED ORDER — BENAZEPRIL HCL 40 MG PO TABS: 40.0000 mg | ORAL_TABLET | Freq: Every day | ORAL | Status: DC

## 2012-01-31 MED ORDER — AMLODIPINE BESYLATE 5 MG PO TABS: 5.0000 mg | ORAL_TABLET | Freq: Every day | ORAL | Status: DC

## 2012-01-31 MED ORDER — METFORMIN HCL 1000 MG PO TABS: 1000.0000 mg | ORAL_TABLET | Freq: Two times a day (BID) | ORAL | Status: DC

## 2012-01-31 MED ORDER — SIMVASTATIN 40 MG PO TABS: 20.0000 mg | ORAL_TABLET | Freq: Every day | ORAL | Status: DC

## 2012-02-07 ENCOUNTER — Ambulatory Visit: Payer: BC Managed Care – PPO | Admitting: Internal Medicine

## 2012-02-09 ENCOUNTER — Encounter: Payer: Self-pay | Admitting: Internal Medicine

## 2012-02-09 ENCOUNTER — Ambulatory Visit (INDEPENDENT_AMBULATORY_CARE_PROVIDER_SITE_OTHER): Payer: BC Managed Care – PPO | Admitting: Internal Medicine

## 2012-02-09 ENCOUNTER — Other Ambulatory Visit (INDEPENDENT_AMBULATORY_CARE_PROVIDER_SITE_OTHER): Payer: BC Managed Care – PPO

## 2012-02-09 VITALS — BP 132/90 | HR 111 | Temp 97.2°F | Ht 67.0 in | Wt 233.0 lb

## 2012-02-09 DIAGNOSIS — Z0000 Encounter for general adult medical examination without abnormal findings: Secondary | ICD-10-CM

## 2012-02-09 DIAGNOSIS — E119 Type 2 diabetes mellitus without complications: Secondary | ICD-10-CM

## 2012-02-09 LAB — HEPATIC FUNCTION PANEL
Albumin: 4.2 g/dL (ref 3.5–5.2)
Alkaline Phosphatase: 77 U/L (ref 39–117)
Bilirubin, Direct: 0.1 mg/dL (ref 0.0–0.3)

## 2012-02-09 LAB — CBC WITH DIFFERENTIAL/PLATELET
Basophils Absolute: 0 10*3/uL (ref 0.0–0.1)
Basophils Relative: 0.4 % (ref 0.0–3.0)
HCT: 38 % (ref 36.0–46.0)
Hemoglobin: 11.9 g/dL — ABNORMAL LOW (ref 12.0–15.0)
Lymphs Abs: 1.5 10*3/uL (ref 0.7–4.0)
Monocytes Relative: 7.6 % (ref 3.0–12.0)
Neutro Abs: 4.1 10*3/uL (ref 1.4–7.7)
RBC: 5.47 Mil/uL — ABNORMAL HIGH (ref 3.87–5.11)
RDW: 16.5 % — ABNORMAL HIGH (ref 11.5–14.6)

## 2012-02-09 LAB — BASIC METABOLIC PANEL
Calcium: 9.4 mg/dL (ref 8.4–10.5)
GFR: 80.3 mL/min (ref >60.00)
Sodium: 137 mEq/L (ref 135–145)

## 2012-02-09 LAB — URINALYSIS, ROUTINE W REFLEX MICROSCOPIC
Ketones, ur: NEGATIVE
Specific Gravity, Urine: 1.025 (ref 1.000–1.030)
Total Protein, Urine: NEGATIVE
Urine Glucose: NEGATIVE
Urobilinogen, UA: 0.2 (ref 0.0–1.0)
pH: 6 (ref 5.0–8.0)

## 2012-02-09 LAB — LIPID PANEL
HDL: 48.1 mg/dL (ref >39.00)
Total CHOL/HDL Ratio: 4
VLDL: 26.6 mg/dL (ref 0.0–40.0)

## 2012-02-09 MED ORDER — METFORMIN HCL 1000 MG PO TABS: 1000.0000 mg | ORAL_TABLET | Freq: Two times a day (BID) | ORAL | Status: DC

## 2012-02-09 MED ORDER — SIMVASTATIN 40 MG PO TABS: 20.0000 mg | ORAL_TABLET | Freq: Every day | ORAL | Status: DC

## 2012-02-09 MED ORDER — BENAZEPRIL HCL 40 MG PO TABS: 40.0000 mg | ORAL_TABLET | Freq: Every day | ORAL | Status: DC

## 2012-02-09 MED ORDER — AMLODIPINE BESYLATE 5 MG PO TABS: 5.0000 mg | ORAL_TABLET | Freq: Every day | ORAL | Status: DC

## 2012-02-09 NOTE — Assessment & Plan Note (Signed)
stable overall by hx and exam, most recent data reviewed with pt, and pt to continue medical treatment as before Lab Results  Component Value Date   HGBA1C 7.7* 08/30/2011   For lab today and next visit

## 2012-02-09 NOTE — Patient Instructions (Addendum)
Please remember to followup with your GYN for the yearly pap smear and/or mammogram as you do Continue all other medications as before Your medications were all refilled today Please take the Aspirin 81 mg per day (enteric coated only) to help reduce the risk of heart disease and stroke Please go to LAB in the Basement for the blood and/or urine tests to be done today You will be contacted by phone if any changes need to be made immediately.  Otherwise, you will receive a letter about your results with an explanation. Please return in 6 mo with Lab testing done 3-5 days before

## 2012-02-09 NOTE — Assessment & Plan Note (Addendum)
Overall doing well, age appropriate education and counseling updated, referrals for preventative services and immunizations addressed, dietary and smoking counseling addressed, most recent labs and ECG reviewed.  I have personally reviewed and have noted: 1) the patient's medical and social history 2) The pt's use of alcohol, tobacco, and illicit drugs 3) The patient's current medications and supplements 4) Functional ability including ADL's, fall risk, home safety risk, hearing and visual impairment 5) Diet and physical activities 6) Evidence for depression or mood disorder 7) The patient's height, weight, and BMI have been recorded in the chart I have made referrals, and provided counseling and education based on review of the above Decliens immunizations today, urged pt to take ASA 81 mg per day

## 2012-02-09 NOTE — Progress Notes (Signed)
Subjective:    Patient ID: Jessica Daniels, female    DOB: [birth year], 51 y.o.   MRN: [id number]  HPI  Here for wellness and f/u;  Overall doing ok;  Pt denies CP, worsening SOB, DOE, wheezing, orthopnea, PND, worsening LE edema, palpitations, dizziness or syncope.  Pt denies neurological change such as new Headache, facial or extremity weakness.  Pt denies polydipsia, polyuria, or low sugar symptoms. Pt states overall good compliance with treatment and medications, good tolerability, and trying to follow lower cholesterol diet.  Pt denies worsening depressive symptoms, suicidal ideation or panic. No fever, wt loss, night sweats, loss of appetite, or other constitutional symptoms.  Pt states good ability with ADL's, low fall risk, home safety reviewed and adequate, no significant changes in hearing or vision, and occasionally active with exercise.  Tolerating the metformin at 1000 bid well with rare diarrhea.  Not taking the ASA 81 mg as she suspects asa may have contributed to her mother renal failure Past Medical History  Diagnosis Date  . Hypertension   . Hyperlipidemia   . Anxiety   . Depression   . Anemia   . Diabetes mellitus   . Allergic rhinitis    Past Surgical History  Procedure Date  . Cholecystectomy   . Lumbar laminectomy     reports that she has never smoked. She does not have any smokeless tobacco history on file. She reports that she does not drink alcohol or use illicit drugs. family history includes Diabetes in an unspecified family member. No Known Allergies Current Outpatient Prescriptions on File Prior to Visit  Medication Sig Dispense Refill  . amLODipine (NORVASC) 5 MG tablet Take 1 tablet (5 mg total) by mouth daily.  30 tablet  0  . benazepril (LOTENSIN) 40 MG tablet Take 1 tablet (40 mg total) by mouth daily.  30 tablet  0  . metFORMIN (GLUCOPHAGE) 1000 MG tablet Take 1 tablet (1,000 mg total) by mouth 2 (two) times daily with a meal.  30 tablet  0  .  simvastatin (ZOCOR) 40 MG tablet Take 0.5 tablets (20 mg total) by mouth daily.  15 tablet  0  . aspirin 81 MG EC tablet Take 81 mg by mouth daily.         Review of Systems Review of Systems  Constitutional: Negative for diaphoresis, activity change, appetite change and unexpected weight change.  HENT: Negative for hearing loss, ear pain, facial swelling, mouth sores and neck stiffness.   Eyes: Negative for pain, redness and visual disturbance.  Respiratory: Negative for shortness of breath and wheezing.   Cardiovascular: Negative for chest pain and palpitations.  Gastrointestinal: Negative for diarrhea, blood in stool, abdominal distention and rectal pain.  Genitourinary: Negative for hematuria, flank pain and decreased urine volume.  Musculoskeletal: Negative for myalgias and joint swelling.  Skin: Negative for color change and wound.  Neurological: Negative for syncope and numbness.  Hematological: Negative for adenopathy.  Psychiatric/Behavioral: Negative for hallucinations, self-injury, decreased concentration and agitation.      Objective:   Physical Exam BP 132/90  Pulse 111  Temp 97.2 F (36.2 C) (Oral)  Ht 5\' 7"  (1.702 m)  Wt 233 lb (105.688 kg)  BMI 36.49 kg/m2  SpO2 97% Physical Exam  VS noted Constitutional: Pt is oriented to person, place, and time. Appears well-developed and well-nourished.  HENT:  Head: Normocephalic and atraumatic.  Right Ear: External ear normal.  Left Ear: External ear normal.  Nose: Nose normal.  Mouth/Throat: Oropharynx  is clear and moist.  Eyes: Conjunctivae and EOM are normal. Pupils are equal, round, and reactive to light.  Neck: Normal range of motion. Neck supple. No JVD present. No tracheal deviation present.  Cardiovascular: Normal rate, regular rhythm, normal heart sounds and intact distal pulses.   Pulmonary/Chest: Effort normal and breath sounds normal.  Abdominal: Soft. Bowel sounds are normal. There is no tenderness.    Musculoskeletal: Normal range of motion. Exhibits no edema.  Lymphadenopathy:  Has no cervical adenopathy.  Neurological: Pt is alert and oriented to person, place, and time. Pt has normal reflexes. No cranial nerve deficit.  Skin: Skin is warm and dry. No rash noted.  Psychiatric:  Has  normal mood and affect. Behavior is normal.     Assessment & Plan:

## 2012-07-27 ENCOUNTER — Other Ambulatory Visit (INDEPENDENT_AMBULATORY_CARE_PROVIDER_SITE_OTHER): Payer: BC Managed Care – PPO

## 2012-07-27 DIAGNOSIS — E119 Type 2 diabetes mellitus without complications: Secondary | ICD-10-CM

## 2012-07-27 LAB — BASIC METABOLIC PANEL
Chloride: 102 mEq/L (ref 96–112)
GFR: 80.15 mL/min (ref >60.00)
Potassium: 3.4 mEq/L — ABNORMAL LOW (ref 3.5–5.1)
Sodium: 137 mEq/L (ref 135–145)

## 2012-07-27 LAB — LIPID PANEL
LDL Cholesterol: 88 mg/dL (ref 0–99)
Total CHOL/HDL Ratio: 4
VLDL: 37.2 mg/dL (ref 0.0–40.0)

## 2012-08-01 ENCOUNTER — Encounter: Payer: Self-pay | Admitting: Internal Medicine

## 2012-08-01 ENCOUNTER — Ambulatory Visit (INDEPENDENT_AMBULATORY_CARE_PROVIDER_SITE_OTHER): Payer: BC Managed Care – PPO | Admitting: Internal Medicine

## 2012-08-01 VITALS — BP 138/88 | HR 80 | Temp 97.3°F | Ht 67.0 in | Wt 239.5 lb

## 2012-08-01 DIAGNOSIS — I10 Essential (primary) hypertension: Secondary | ICD-10-CM

## 2012-08-01 DIAGNOSIS — E119 Type 2 diabetes mellitus without complications: Secondary | ICD-10-CM

## 2012-08-01 DIAGNOSIS — E785 Hyperlipidemia, unspecified: Secondary | ICD-10-CM

## 2012-08-01 DIAGNOSIS — Z0000 Encounter for general adult medical examination without abnormal findings: Secondary | ICD-10-CM

## 2012-08-01 MED ORDER — SITAGLIPTIN PHOSPHATE 50 MG PO TABS: 50.0000 mg | ORAL_TABLET | Freq: Every day | ORAL | Status: DC

## 2012-08-01 MED ORDER — METFORMIN HCL 1000 MG PO TABS: 1000.0000 mg | ORAL_TABLET | Freq: Every day | ORAL | Status: DC

## 2012-08-01 MED ORDER — ATORVASTATIN CALCIUM 20 MG PO TABS: 20.0000 mg | ORAL_TABLET | Freq: Every day | ORAL | Status: DC

## 2012-08-01 NOTE — Assessment & Plan Note (Signed)
Mild uncontrolled, to change zocor to lipitor for further LDL lowering while on amlodipine Lab Results  Component Value Date   LDLCALC 88 07/27/2012

## 2012-08-01 NOTE — Assessment & Plan Note (Addendum)
ECG reviewed as per emr, stable overall by history and exam, recent data reviewed with pt, and pt to continue medical treatment as before,  to f/u any worsening symptoms or concerns BP Readings from Last 3 Encounters:  08/01/12 138/88  02/09/12 132/90  08/30/11 130/86

## 2012-08-01 NOTE — Assessment & Plan Note (Signed)
Uncontrolled, to cont the metformin at 1000 in am only, add januvia 50 qd, cont diet and wt loss efforts, f/u next visit Lab Results  Component Value Date   HGBA1C 7.4* 07/27/2012

## 2012-08-01 NOTE — Progress Notes (Signed)
Subjective:    Patient ID: Jessica Daniels, female    DOB: Jul 30, 1960, 52 y.o.   MRN: [id number]  HPI  Here to f/u; overall doing ok,  Pt denies chest pain, increased sob or doe, wheezing, orthopnea, PND, increased LE swelling, palpitations, dizziness or syncope.  Pt denies polydipsia, polyuria, or low sugar symptoms such as weakness or confusion improved with po intake.  Pt denies new neurological symptoms such as new headache, or facial or extremity weakness or numbness.   Pt states overall good compliance with meds, has been trying to follow lower cholesterol, diabetic diet, with wt overall stable,  but little exercise however.   Pt denies fever, wt loss, night sweats, loss of appetite, or other constitutional symptoms  No other acute complaints Past Medical History  Diagnosis Date  . Hypertension   . Hyperlipidemia   . Anxiety   . Depression   . Anemia   . Diabetes mellitus   . Allergic rhinitis    Past Surgical History  Procedure Date  . Cholecystectomy   . Lumbar laminectomy     reports that she has never smoked. She does not have any smokeless tobacco history on file. She reports that she does not drink alcohol or use illicit drugs. family history includes Diabetes in an unspecified family member. Allergies  Allergen Reactions  . Crestor (Rosuvastatin)     Elevated liver enzymes   Current Outpatient Prescriptions on File Prior to Visit  Medication Sig Dispense Refill  . amLODipine (NORVASC) 5 MG tablet Take 1 tablet (5 mg total) by mouth daily.  90 tablet  3  . aspirin 81 MG EC tablet Take 81 mg by mouth daily.        . benazepril (LOTENSIN) 40 MG tablet Take 1 tablet (40 mg total) by mouth daily.  90 tablet  3  . metFORMIN (GLUCOPHAGE) 1000 MG tablet Take 1 tablet (1,000 mg total) by mouth daily with breakfast.  90 tablet  3  . atorvastatin (LIPITOR) 20 MG tablet Take 1 tablet (20 mg total) by mouth daily.  90 tablet  3  . sitaGLIPtin (JANUVIA) 50 MG tablet Take 1 tablet  (50 mg total) by mouth daily.  90 tablet  3   Review of Systems  Constitutional: Negative for unexpected weight change, or unusual diaphoresis  HENT: Negative for tinnitus.   Eyes: Negative for photophobia and visual disturbance.  Respiratory: Negative for choking and stridor.   Gastrointestinal: Negative for vomiting and blood in stool.  Genitourinary: Negative for hematuria and decreased urine volume.  Musculoskeletal: Negative for acute joint swelling Skin: Negative for color change and wound.  Neurological: Negative for tremors and numbness other than noted  Psychiatric/Behavioral: Negative for decreased concentration or  hyperactivity.       Objective:   Physical Exam BP 138/88  Pulse 80  Temp 97.3 F (36.3 C) (Oral)  Ht 5\' 7"  (1.702 m)  Wt 239 lb 8 oz (108.636 kg)  BMI 37.51 kg/m2  SpO2 97% VS noted,  Constitutional: Pt appears well-developed and well-nourished.  HENT: Head: NCAT.  Right Ear: External ear normal.  Left Ear: External ear normal.  Eyes: Conjunctivae and EOM are normal. Pupils are equal, round, and reactive to light.  Neck: Normal range of motion. Neck supple.  Cardiovascular: Normal rate and regular rhythm.   Pulmonary/Chest: Effort normal and breath sounds normal.  Abd:  Soft, NT, non-distended, + BS Neurological: Pt is alert. Not confused  Skin: Skin is warm. No erythema.  Psychiatric: Pt behavior is normal. Thought content normal.     Assessment & Plan:

## 2012-08-01 NOTE — Patient Instructions (Addendum)
OK to decrease the metformin 1000 mg to the once in the AM as you have been doing Please take all new medication as prescribed - the Januvia at 50 mg per day OK to stop the simvastatin 20 mg Please start the Lipitor (generic) at 20 mg per day (which should be more effective) Please continue all other medications as before, and refills have been done as requested Your EKG was OK today You are given the copy of your lab work Please continue your efforts at being more active, low cholesterol diabetic diet, and weight control. Please remember to sign up for My Chart if you have not done so, as this will be important to you in the future with finding out test results, communicating by private email, and scheduling acute appointments online when needed. Please return in 6 months, or sooner if needed, with Lab testing done 3-5 days before

## 2012-08-11 ENCOUNTER — Ambulatory Visit: Payer: BC Managed Care – PPO | Admitting: Internal Medicine

## 2012-09-01 ENCOUNTER — Ambulatory Visit: Payer: BC Managed Care – PPO | Admitting: Internal Medicine

## 2012-10-14 ENCOUNTER — Ambulatory Visit (INDEPENDENT_AMBULATORY_CARE_PROVIDER_SITE_OTHER): Payer: BC Managed Care – PPO | Admitting: Family Medicine

## 2012-10-14 ENCOUNTER — Encounter: Payer: Self-pay | Admitting: Family Medicine

## 2012-10-14 VITALS — BP 122/86 | HR 86 | Temp 99.3°F | Ht 67.0 in | Wt 235.0 lb

## 2012-10-14 DIAGNOSIS — J029 Acute pharyngitis, unspecified: Secondary | ICD-10-CM

## 2012-10-14 MED ORDER — AMOXICILLIN 875 MG PO TABS: 875.0000 mg | ORAL_TABLET | Freq: Two times a day (BID) | ORAL | Status: DC

## 2012-10-14 NOTE — Patient Instructions (Signed)

## 2012-10-14 NOTE — Progress Notes (Signed)
  Subjective:     Jessica Daniels is a 52 y.o. female who presents for evaluation of sore throat. Associated symptoms include nasal blockage, post nasal drip, sore throat and swollen glands and fever. Onset of symptoms was 2 days ago, and have been gradually worsening since that time. She is drinking plenty of fluids. She has not had a recent close exposure to someone with proven streptococcal pharyngitis.  The following portions of the patient's history were reviewed and updated as appropriate: allergies, current medications, past family history, past medical history, past social history, past surgical history and problem list.  Review of Systems Pertinent items are noted in HPI.    Objective:    BP 122/86  Pulse 86  Temp(Src) 99.3 F (37.4 C) (Oral)  Ht 5\' 7"  (1.702 m)  Wt 235 lb (106.595 kg)  BMI 36.8 kg/m2  SpO2 96% General appearance: alert, cooperative, appears stated age and no distress Ears: normal TM's and external ear canals both ears Nose: clear discharge, mild congestion, turbinates red, swollen Throat: abnormal findings: exudates present and marked oropharyngeal erythema Neck: mild anterior cervical adenopathy, supple, symmetrical, trachea midline and thyroid not enlarged, symmetric, no tenderness/mass/nodules Lungs: clear to auscultation bilaterally Heart: S1, S2 normal  Laborator strep test done. Results:negative.    Assessment:    Acute pharyngitis -.   Plan:    Patient placed on antibiotics. Use of OTC analgesics recommended as well as salt water gargles. Follow up as needed.

## 2012-10-16 ENCOUNTER — Encounter: Payer: Self-pay | Admitting: Internal Medicine

## 2012-10-16 ENCOUNTER — Ambulatory Visit (INDEPENDENT_AMBULATORY_CARE_PROVIDER_SITE_OTHER): Payer: BC Managed Care – PPO | Admitting: Internal Medicine

## 2012-10-16 VITALS — BP 118/88 | HR 75 | Temp 97.3°F | Ht 67.0 in | Wt 232.5 lb

## 2012-10-16 DIAGNOSIS — J029 Acute pharyngitis, unspecified: Secondary | ICD-10-CM

## 2012-10-16 DIAGNOSIS — I10 Essential (primary) hypertension: Secondary | ICD-10-CM

## 2012-10-16 DIAGNOSIS — F3289 Other specified depressive episodes: Secondary | ICD-10-CM

## 2012-10-16 DIAGNOSIS — F329 Major depressive disorder, single episode, unspecified: Secondary | ICD-10-CM

## 2012-10-16 DIAGNOSIS — E119 Type 2 diabetes mellitus without complications: Secondary | ICD-10-CM

## 2012-10-16 MED ORDER — TRAMADOL HCL 50 MG PO TABS: 50.0000 mg | ORAL_TABLET | Freq: Four times a day (QID) | ORAL | Status: DC | PRN

## 2012-10-16 MED ORDER — LEVOFLOXACIN 500 MG PO TABS: 500.0000 mg | ORAL_TABLET | Freq: Every day | ORAL | Status: DC

## 2012-10-16 NOTE — Patient Instructions (Addendum)
OK to stop the amoxil Please take all new medication as prescribed - the different antibiotic, and the tramadol you have at home Please continue all other medications as before, and refills have been done if requested. Please remember to sign up for My Chart if you have not done so, as this will be important to you in the future with finding out test results, communicating by private email, and scheduling acute appointments online when needed.

## 2012-10-16 NOTE — Progress Notes (Signed)
Subjective:    Patient ID: Jessica Daniels, female    DOB: Nov 17, 1960, 52 y.o.   MRN: [id number]  HPI   Here with 2-3 days acute onset fever, severe ST pain, pressure, headache, general weakness and malaise, with mild non prod cough, but pt denies chest pain, wheezing, increased sob or doe, orthopnea, PND, increased LE swelling, palpitations, dizziness or syncope. Pt denies new neurological symptoms such as new headache, or facial or extremity weakness or numbness   Pt denies polydipsia, polyuria, or low sugar symptoms such as weakness or confusion improved with po intake.  Pt states overall good compliance with meds, trying to follow lower cholesterol, diabetic diet, wt overall stable but little exercise however.    Denies worsening depressive symptoms, suicidal ideation, or panic Past Medical History  Diagnosis Date  . Hypertension   . Hyperlipidemia   . Anxiety   . Depression   . Anemia   . Diabetes mellitus   . Allergic rhinitis    Past Surgical History  Procedure Laterality Date  . Cholecystectomy    . Lumbar laminectomy      reports that she has never smoked. She does not have any smokeless tobacco history on file. She reports that she does not drink alcohol or use illicit drugs. family history includes Diabetes in an unspecified family member. Allergies  Allergen Reactions  . Crestor (Rosuvastatin)     Elevated liver enzymes   Current Outpatient Prescriptions on File Prior to Visit  Medication Sig Dispense Refill  . amLODipine (NORVASC) 5 MG tablet Take 1 tablet (5 mg total) by mouth daily.  90 tablet  3  . amoxicillin (AMOXIL) 875 MG tablet Take 1 tablet (875 mg total) by mouth 2 (two) times daily.  20 tablet  0  . aspirin 81 MG EC tablet Take 81 mg by mouth daily.        Marland Kitchen atorvastatin (LIPITOR) 20 MG tablet Take 1 tablet (20 mg total) by mouth daily.  90 tablet  3  . benazepril (LOTENSIN) 40 MG tablet Take 1 tablet (40 mg total) by mouth daily.  90 tablet  3  . metFORMIN  (GLUCOPHAGE) 1000 MG tablet Take 1 tablet (1,000 mg total) by mouth daily with breakfast.  90 tablet  3  . sitaGLIPtin (JANUVIA) 50 MG tablet Take 1 tablet (50 mg total) by mouth daily.  90 tablet  3   No current facility-administered medications on file prior to visit.   Review of Systems  Constitutional: Negative for unexpected weight change, or unusual diaphoresis  HENT: Negative for tinnitus.   Eyes: Negative for photophobia and visual disturbance.  Respiratory: Negative for choking and stridor.   Gastrointestinal: Negative for vomiting and blood in stool.  Genitourinary: Negative for hematuria and decreased urine volume.  Musculoskeletal: Negative for acute joint swelling Skin: Negative for color change and wound.  Neurological: Negative for tremors and numbness other than noted  Psychiatric/Behavioral: Negative for decreased concentration or  hyperactivity.       Objective:   Physical Exam BP 118/88  Pulse 75  Temp(Src) 97.3 F (36.3 C) (Oral)  Ht 5\' 7"  (1.702 m)  Wt 232 lb 8 oz (105.461 kg)  BMI 36.41 kg/m2  SpO2 98% VS noted, mild ill Constitutional: Pt appears well-developed and well-nourished.  HENT: Head: NCAT.  Right Ear: External ear normal.  Left Ear: External ear normal.  Bilat tm's with mild erythema.  Max sinus areas mild tender.  Pharynx with mild erythema,with bilat tonsillar exudate Eyes:  Conjunctivae and EOM are normal. Pupils are equal, round, and reactive to light.  Neck: Normal range of motion. Neck supple. with bilat submandib tender small LA Cardiovascular: Normal rate and regular rhythm.   Pulmonary/Chest: Effort normal and breath sounds normal.  Neurological: Pt is alert. Not confused  Skin: Skin is warm. No erythema.  Psychiatric: Pt behavior is normal. Thought content normal. 1+ nervous    Assessment & Plan:

## 2012-10-18 NOTE — Assessment & Plan Note (Signed)
Mild to mod, for antibx course,  to f/u any worsening symptoms or concerns 

## 2012-10-18 NOTE — Assessment & Plan Note (Signed)
stable overall by history and exam, recent data reviewed with pt, and pt to continue medical treatment as before,  to f/u any worsening symptoms or concerns Lab Results  Component Value Date   WBC 6.4 02/09/2012   HGB 11.9* 02/09/2012   HCT 38.0 02/09/2012   PLT 326.0 02/09/2012   GLUCOSE 130* 07/27/2012   CHOL 167 07/27/2012   TRIG 186.0* 07/27/2012   HDL 42.30 07/27/2012   LDLDIRECT 137.4 10/20/2009   LDLCALC 88 07/27/2012   ALT 14 02/09/2012   AST 18 02/09/2012   NA 137 07/27/2012   K 3.4* 07/27/2012   CL 102 07/27/2012   CREATININE 1.0 07/27/2012   BUN 8 07/27/2012   CO2 28 07/27/2012   TSH 2.26 02/09/2012   HGBA1C 7.4* 07/27/2012   MICROALBUR 3.5* 02/09/2012

## 2012-10-18 NOTE — Assessment & Plan Note (Signed)
stable overall by history and exam, recent data reviewed with pt, and pt to continue medical treatment as before,  to f/u any worsening symptoms or concerns Lab Results  Component Value Date   HGBA1C 7.4* 07/27/2012

## 2012-10-18 NOTE — Assessment & Plan Note (Signed)
stable overall by history and exam, recent data reviewed with pt, and pt to continue medical treatment as before,  to f/u any worsening symptoms or concerns BP Readings from Last 3 Encounters:  10/16/12 118/88  10/14/12 122/86  08/01/12 138/88

## 2013-01-26 ENCOUNTER — Emergency Department (HOSPITAL_COMMUNITY)
Admission: EM | Admit: 2013-01-26 | Discharge: 2013-01-26 | Disposition: A | Discharge status: Home / Self Care | Payer: BC Managed Care – PPO | Attending: Emergency Medicine | Admitting: Emergency Medicine

## 2013-01-26 ENCOUNTER — Encounter (HOSPITAL_COMMUNITY): Payer: Self-pay | Admitting: Emergency Medicine

## 2013-01-26 DIAGNOSIS — Y92410 Unspecified street and highway as the place of occurrence of the external cause: Secondary | ICD-10-CM | POA: Insufficient documentation

## 2013-01-26 DIAGNOSIS — S0990XA Unspecified injury of head, initial encounter: Secondary | ICD-10-CM | POA: Insufficient documentation

## 2013-01-26 DIAGNOSIS — F411 Generalized anxiety disorder: Secondary | ICD-10-CM | POA: Insufficient documentation

## 2013-01-26 DIAGNOSIS — F3289 Other specified depressive episodes: Secondary | ICD-10-CM | POA: Insufficient documentation

## 2013-01-26 DIAGNOSIS — F329 Major depressive disorder, single episode, unspecified: Secondary | ICD-10-CM | POA: Insufficient documentation

## 2013-01-26 DIAGNOSIS — E119 Type 2 diabetes mellitus without complications: Secondary | ICD-10-CM | POA: Insufficient documentation

## 2013-01-26 DIAGNOSIS — Z7982 Long term (current) use of aspirin: Secondary | ICD-10-CM | POA: Insufficient documentation

## 2013-01-26 DIAGNOSIS — I10 Essential (primary) hypertension: Secondary | ICD-10-CM | POA: Insufficient documentation

## 2013-01-26 DIAGNOSIS — Z862 Personal history of diseases of the blood and blood-forming organs and certain disorders involving the immune mechanism: Secondary | ICD-10-CM | POA: Insufficient documentation

## 2013-01-26 DIAGNOSIS — Z79899 Other long term (current) drug therapy: Secondary | ICD-10-CM | POA: Insufficient documentation

## 2013-01-26 DIAGNOSIS — Z792 Long term (current) use of antibiotics: Secondary | ICD-10-CM | POA: Insufficient documentation

## 2013-01-26 DIAGNOSIS — S39012A Strain of muscle, fascia and tendon of lower back, initial encounter: Secondary | ICD-10-CM

## 2013-01-26 DIAGNOSIS — Y9389 Activity, other specified: Secondary | ICD-10-CM | POA: Insufficient documentation

## 2013-01-26 DIAGNOSIS — E785 Hyperlipidemia, unspecified: Secondary | ICD-10-CM | POA: Insufficient documentation

## 2013-01-26 DIAGNOSIS — S335XXA Sprain of ligaments of lumbar spine, initial encounter: Secondary | ICD-10-CM | POA: Insufficient documentation

## 2013-01-26 DIAGNOSIS — M549 Dorsalgia, unspecified: Secondary | ICD-10-CM

## 2013-01-26 DIAGNOSIS — Z8709 Personal history of other diseases of the respiratory system: Secondary | ICD-10-CM | POA: Insufficient documentation

## 2013-01-26 MED ORDER — IBUPROFEN 800 MG PO TABS: 800.0000 mg | ORAL_TABLET | Freq: Three times a day (TID) | ORAL | Status: DC

## 2013-01-26 MED ORDER — OXYCODONE-ACETAMINOPHEN 5-325 MG PO TABS
2.0000 | ORAL_TABLET | Freq: Once | ORAL | Status: AC
  Administered 2013-01-26: 2 via ORAL
  Filled 2013-01-26: qty 2

## 2013-01-26 MED ORDER — METHOCARBAMOL 500 MG PO TABS: 500.0000 mg | ORAL_TABLET | Freq: Two times a day (BID) | ORAL | Status: DC

## 2013-01-26 MED ORDER — HYDROCODONE-ACETAMINOPHEN 5-325 MG PO TABS: 2.0000 | ORAL_TABLET | Freq: Four times a day (QID) | ORAL | Status: DC | PRN

## 2013-01-26 NOTE — ED Provider Notes (Signed)
Medical screening examination/treatment/procedure(s) were performed by non-physician practitioner and as supervising physician I was immediately available for consultation/collaboration.   [name], MD 01/26/13 [phone]
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
## 2013-01-30 ENCOUNTER — Ambulatory Visit (INDEPENDENT_AMBULATORY_CARE_PROVIDER_SITE_OTHER): Payer: BC Managed Care – PPO | Admitting: Internal Medicine

## 2013-01-30 ENCOUNTER — Other Ambulatory Visit (INDEPENDENT_AMBULATORY_CARE_PROVIDER_SITE_OTHER): Payer: BC Managed Care – PPO

## 2013-01-30 ENCOUNTER — Encounter: Payer: Self-pay | Admitting: Internal Medicine

## 2013-01-30 VITALS — BP 144/100 | HR 70 | Temp 97.7°F | Ht 67.0 in | Wt 228.2 lb

## 2013-01-30 DIAGNOSIS — I10 Essential (primary) hypertension: Secondary | ICD-10-CM

## 2013-01-30 DIAGNOSIS — IMO0001 Reserved for inherently not codable concepts without codable children: Secondary | ICD-10-CM

## 2013-01-30 DIAGNOSIS — E785 Hyperlipidemia, unspecified: Secondary | ICD-10-CM

## 2013-01-30 DIAGNOSIS — E119 Type 2 diabetes mellitus without complications: Secondary | ICD-10-CM

## 2013-01-30 DIAGNOSIS — M545 Low back pain, unspecified: Secondary | ICD-10-CM | POA: Insufficient documentation

## 2013-01-30 DIAGNOSIS — Z0000 Encounter for general adult medical examination without abnormal findings: Secondary | ICD-10-CM

## 2013-01-30 LAB — CBC WITH DIFFERENTIAL/PLATELET
Basophils Absolute: 0 10*3/uL (ref 0.0–0.1)
Eosinophils Absolute: 0.1 10*3/uL (ref 0.0–0.7)
HCT: 38.2 % (ref 36.0–46.0)
Lymphs Abs: 1.4 10*3/uL (ref 0.7–4.0)
MCHC: 31.3 g/dL (ref 30.0–36.0)
Monocytes Relative: 6 % (ref 3.0–12.0)
Neutro Abs: 3.2 10*3/uL (ref 1.4–7.7)
Platelets: 377 10*3/uL (ref 150.0–400.0)
RDW: 16.9 % — ABNORMAL HIGH (ref 11.5–14.6)

## 2013-01-30 LAB — URINALYSIS, ROUTINE W REFLEX MICROSCOPIC
Bilirubin Urine: NEGATIVE
Hgb urine dipstick: NEGATIVE
Ketones, ur: NEGATIVE
Leukocytes, UA: NEGATIVE
RBC / HPF: NONE SEEN (ref 0–?)
Specific Gravity, Urine: 1.015 (ref 1.000–1.030)
Urine Glucose: NEGATIVE
Urobilinogen, UA: 0.2 (ref 0.0–1.0)

## 2013-01-30 LAB — MICROALBUMIN / CREATININE URINE RATIO: Microalb, Ur: 2.6 mg/dL — ABNORMAL HIGH (ref 0.0–1.9)

## 2013-01-30 LAB — HEPATIC FUNCTION PANEL
ALT: 18 U/L (ref 0–35)
Total Bilirubin: 0.4 mg/dL (ref 0.3–1.2)
Total Protein: 8.6 g/dL — ABNORMAL HIGH (ref 6.0–8.3)

## 2013-01-30 LAB — BASIC METABOLIC PANEL
BUN: 8 mg/dL (ref 6–23)
Chloride: 102 mEq/L (ref 96–112)
Creatinine, Ser: 1 mg/dL (ref 0.4–1.2)

## 2013-01-30 LAB — TSH: TSH: 1.62 u[IU]/mL (ref 0.35–5.50)

## 2013-01-30 LAB — LIPID PANEL
Cholesterol: 147 mg/dL (ref 0–200)
LDL Cholesterol: 80 mg/dL (ref 0–99)
Triglycerides: 114 mg/dL (ref 0.0–149.0)

## 2013-01-30 MED ORDER — AMLODIPINE BESYLATE 10 MG PO TABS: 10.0000 mg | ORAL_TABLET | Freq: Every day | ORAL | Status: DC

## 2013-01-30 MED ORDER — PREDNISONE 10 MG PO TABS: ORAL_TABLET | ORAL | Status: DC

## 2013-01-30 MED ORDER — TRAMADOL HCL 50 MG PO TABS: 50.0000 mg | ORAL_TABLET | Freq: Four times a day (QID) | ORAL | Status: DC | PRN

## 2013-01-30 MED ORDER — GLUCOSE BLOOD VI STRP: ORAL_STRIP | Status: DC

## 2013-01-30 NOTE — Assessment & Plan Note (Signed)
Mild, c/w msk strain vs underlying flare , for tramadol
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
## 2013-01-30 NOTE — Assessment & Plan Note (Signed)
stable overall by history and exam, recent data reviewed with pt, and pt to continue medical treatment as before,  to f/u any worsening symptoms or concerns Lab Results  Component Value Date   LDLCALC 80 01/30/2013

## 2013-01-30 NOTE — Assessment & Plan Note (Signed)
stable overall by history and exam, recent data reviewed with pt, and pt to continue medical treatment as before,  to f/u any worsening symptoms or concerns, for labs Lab Results  Component Value Date   HGBA1C 7.1* 01/30/2013

## 2013-01-30 NOTE — Patient Instructions (Addendum)
Please take all new medication as prescribed - the increased amlodipine to 10 mg (sent to pharmacy), the tramadol and low dose prednisone Please continue all other medications as before, and refills have been done if requested. You are also given the [facility] glucometer If the strips are not covered with your insurance, please call and we can change to the One Touch system Please go to the LAB in the Basement (turn left off the elevator) for the tests to be done today You will be contacted by phone if any changes need to be made immediately.  Otherwise, you will receive a letter about your results with an explanation, but please check with MyChart first.  Please remember to sign up for My Chart if you have not done so, as this will be important to you in the future with finding out test results, communicating by private email, and scheduling acute appointments online when needed.  Please return in 3 weeks, or sooner if needed

## 2013-01-30 NOTE — Assessment & Plan Note (Signed)
Uncontrolled, for increased amlodipine 10 qd 

## 2013-02-09 ENCOUNTER — Other Ambulatory Visit: Payer: Self-pay | Admitting: Internal Medicine

## 2013-02-20 ENCOUNTER — Ambulatory Visit (INDEPENDENT_AMBULATORY_CARE_PROVIDER_SITE_OTHER): Payer: BC Managed Care – PPO | Admitting: Internal Medicine

## 2013-02-20 ENCOUNTER — Encounter: Payer: Self-pay | Admitting: Internal Medicine

## 2013-02-20 VITALS — BP 132/70 | HR 82 | Temp 98.2°F | Ht 67.0 in | Wt 236.5 lb

## 2013-02-20 DIAGNOSIS — F3289 Other specified depressive episodes: Secondary | ICD-10-CM

## 2013-02-20 DIAGNOSIS — M545 Low back pain, unspecified: Secondary | ICD-10-CM

## 2013-02-20 DIAGNOSIS — IMO0001 Reserved for inherently not codable concepts without codable children: Secondary | ICD-10-CM

## 2013-02-20 DIAGNOSIS — Z0000 Encounter for general adult medical examination without abnormal findings: Secondary | ICD-10-CM

## 2013-02-20 DIAGNOSIS — I10 Essential (primary) hypertension: Secondary | ICD-10-CM

## 2013-02-20 DIAGNOSIS — F329 Major depressive disorder, single episode, unspecified: Secondary | ICD-10-CM

## 2013-02-20 NOTE — Assessment & Plan Note (Signed)
Improved and stable overall by history and exam, recent data reviewed with pt, and pt to continue medical treatment as before,  to f/u any worsening symptoms or concerns BP Readings from Last 3 Encounters:  02/20/13 132/70  01/30/13 144/100  01/26/13 164/101

## 2013-02-20 NOTE — Assessment & Plan Note (Signed)
Resolved with recent tx,  to f/u any worsening symptoms or concerns

## 2013-02-20 NOTE — Progress Notes (Signed)
Subjective:    Patient ID: Jessica Daniels, female    DOB: 31-Oct-1960, 52 y.o.   MRN: [id number]  HPI Here to f/u recent med change overall doing ok,  Pt denies chest pain, increased sob or doe, wheezing, orthopnea, PND, increased LE swelling, palpitations, dizziness or syncope.  Pt denies polydipsia, polyuria, or low sugar symptoms such as weakness or confusion improved with po intake.  Pt denies new neurological symptoms such as new headache, or facial or extremity weakness or numbness.   Pt states overall good compliance with meds, has been trying to follow lower cholesterol, diabetic diet, with wt overall stable.  Back pain from last visit resolved.  Denies worsening depressive symptoms, suicidal ideation, or panic Past Medical History  Diagnosis Date  . Hypertension   . Hyperlipidemia   . Anxiety   . Depression   . Anemia   . Diabetes mellitus   . Allergic rhinitis    Past Surgical History  Procedure Laterality Date  . Cholecystectomy    . Lumbar laminectomy      reports that she has never smoked. She does not have any smokeless tobacco history on file. She reports that she does not drink alcohol or use illicit drugs. family history includes Diabetes in an other family member. Allergies  Allergen Reactions  . Crestor [Rosuvastatin]     Elevated liver enzymes   Current Outpatient Prescriptions on File Prior to Visit  Medication Sig Dispense Refill  . amLODipine (NORVASC) 10 MG tablet Take 1 tablet (10 mg total) by mouth daily.  90 tablet  3  . benazepril (LOTENSIN) 40 MG tablet TAKE 1 TABLET BY MOUTH EVERY DAY  90 tablet  3  . glucose blood (NOVA MAX TEST) test strip Use as instructed  100 each  12  . ibuprofen (ADVIL,MOTRIN) 800 MG tablet Take 1 tablet (800 mg total) by mouth 3 (three) times daily.  21 tablet  0  . metFORMIN (GLUCOPHAGE) 1000 MG tablet Take 1 tablet (1,000 mg total) by mouth daily with breakfast.  90 tablet  3  . methocarbamol (ROBAXIN) 500 MG tablet Take 1  tablet (500 mg total) by mouth 2 (two) times daily.  20 tablet  0  . predniSONE (DELTASONE) 10 MG tablet 2 tabs by mouth per day for 7 days  14 tablet  0  . sitaGLIPtin (JANUVIA) 50 MG tablet Take 1 tablet (50 mg total) by mouth daily.  90 tablet  3  . traMADol (ULTRAM) 50 MG tablet Take 1 tablet (50 mg total) by mouth every 6 (six) hours as needed for pain.  60 tablet  1   No current facility-administered medications on file prior to visit.   Review of Systems  Constitutional: Negative for unexpected weight change, or unusual diaphoresis  HENT: Negative for tinnitus.   Eyes: Negative for photophobia and visual disturbance.  Respiratory: Negative for choking and stridor.   Gastrointestinal: Negative for vomiting and blood in stool.  Genitourinary: Negative for hematuria and decreased urine volume.  Musculoskeletal: Negative for acute joint swelling Skin: Negative for color change and wound.  Neurological: Negative for tremors and numbness other than noted  Psychiatric/Behavioral: Negative for decreased concentration or  hyperactivity.       Objective:   Physical Exam BP 132/70  Pulse 82  Temp(Src) 98.2 F (36.8 C) (Oral)  Ht 5\' 7"  (1.702 m)  Wt 236 lb 8 oz (107.276 kg)  BMI 37.03 kg/m2  SpO2 99%  LMP 01/16/2013 VS noted,  Constitutional: Pt  appears well-developed and well-nourished.  HENT: Head: NCAT.  Right Ear: External ear normal.  Left Ear: External ear normal.  Eyes: Conjunctivae and EOM are normal. Pupils are equal, round, and reactive to light.  Neck: Normal range of motion. Neck supple.  Cardiovascular: Normal rate and regular rhythm.   Pulmonary/Chest: Effort normal and breath sounds normal.  Neurological: Pt is alert. Not confused , motor 5/5 Skin: Skin is warm. No erythema.  Psychiatric: Pt behavior is normal. Thought content normal. not depressed affect    Assessment & Plan:

## 2013-02-20 NOTE — Patient Instructions (Signed)
Please continue all other medications as before, and refills have been done if requested. Please have the pharmacy call with any other refills you may need.  Please return in 6 months, or sooner if needed, with Lab testing done 3-5 days before  

## 2013-04-02 ENCOUNTER — Telehealth: Payer: Self-pay | Admitting: *Deleted

## 2013-04-02 NOTE — Telephone Encounter (Signed)
Pt called requesting Hycodan refill for cough.  Please advise in Dr Melvyn Novas absence.

## 2013-04-02 NOTE — Telephone Encounter (Signed)
Not on med list,  And doesn't look like it has been filled since April. Will need OV

## 2013-04-02 NOTE — Telephone Encounter (Signed)
Spoke with patient and told her she needs an OV scheduled in order to get hycodan filled. Pt was transferred to scheduling to set up f/u...ds,cma

## 2013-04-03 ENCOUNTER — Encounter: Payer: Self-pay | Admitting: Internal Medicine

## 2013-04-03 ENCOUNTER — Ambulatory Visit (INDEPENDENT_AMBULATORY_CARE_PROVIDER_SITE_OTHER): Payer: BC Managed Care – PPO | Admitting: Internal Medicine

## 2013-04-03 VITALS — BP 130/80 | HR 81 | Temp 98.2°F | Ht 67.0 in | Wt 230.0 lb

## 2013-04-03 DIAGNOSIS — E119 Type 2 diabetes mellitus without complications: Secondary | ICD-10-CM

## 2013-04-03 DIAGNOSIS — J019 Acute sinusitis, unspecified: Secondary | ICD-10-CM

## 2013-04-03 DIAGNOSIS — I10 Essential (primary) hypertension: Secondary | ICD-10-CM

## 2013-04-03 MED ORDER — LEVOFLOXACIN 250 MG PO TABS: 250.0000 mg | ORAL_TABLET | Freq: Every day | ORAL | Status: DC

## 2013-04-03 MED ORDER — HYDROCODONE-HOMATROPINE 5-1.5 MG/5ML PO SYRP: 5.0000 mL | ORAL_SOLUTION | Freq: Four times a day (QID) | ORAL | Status: DC | PRN

## 2013-04-03 NOTE — Assessment & Plan Note (Signed)
Mild to mod, for antibx course,  to f/u any worsening symptoms or concerns, for coughmed as well

## 2013-04-03 NOTE — Progress Notes (Signed)
Subjective:    Patient ID: Jessica Daniels, female    DOB: [birth year], 52 y.o.   MRN: [id number]  HPI   Here with 2-3 days acute onset fever, facial pain, pressure, headache, general weakness and malaise, and greenish d/c, with mild ST and cough, but pt denies chest pain, wheezing, increased sob or doe, orthopnea, PND, increased LE swelling, palpitations, dizziness or syncope.   Pt denies polydipsia, polyuria. Pt denies new neurological symptoms such as new headache, or facial or extremity weakness or numbness Past Medical History  Diagnosis Date  . Hypertension   . Hyperlipidemia   . Anxiety   . Depression   . Anemia   . Diabetes mellitus   . Allergic rhinitis    Past Surgical History  Procedure Laterality Date  . Cholecystectomy    . Lumbar laminectomy      reports that she has never smoked. She does not have any smokeless tobacco history on file. She reports that she does not drink alcohol or use illicit drugs. family history includes Diabetes in an other family member. Allergies  Allergen Reactions  . Crestor [Rosuvastatin]     Elevated liver enzymes   Current Outpatient Prescriptions on File Prior to Visit  Medication Sig Dispense Refill  . amLODipine (NORVASC) 10 MG tablet Take 1 tablet (10 mg total) by mouth daily.  90 tablet  3  . benazepril (LOTENSIN) 40 MG tablet TAKE 1 TABLET BY MOUTH EVERY DAY  90 tablet  3  . glucose blood (NOVA MAX TEST) test strip Use as instructed  100 each  12  . ibuprofen (ADVIL,MOTRIN) 800 MG tablet Take 1 tablet (800 mg total) by mouth 3 (three) times daily.  21 tablet  0  . metFORMIN (GLUCOPHAGE) 1000 MG tablet Take 1 tablet (1,000 mg total) by mouth daily with breakfast.  90 tablet  3  . methocarbamol (ROBAXIN) 500 MG tablet Take 1 tablet (500 mg total) by mouth 2 (two) times daily.  20 tablet  0  . sitaGLIPtin (JANUVIA) 50 MG tablet Take 1 tablet (50 mg total) by mouth daily.  90 tablet  3  . traMADol (ULTRAM) 50 MG tablet Take 1 tablet  (50 mg total) by mouth every 6 (six) hours as needed for pain.  60 tablet  1   No current facility-administered medications on file prior to visit.    Review of Systems  Constitutional: Negative for unexpected weight change, or unusual diaphoresis  HENT: Negative for tinnitus.   Eyes: Negative for photophobia and visual disturbance.  Respiratory: Negative for choking and stridor.   Gastrointestinal: Negative for vomiting and blood in stool.  Genitourinary: Negative for hematuria and decreased urine volume.  Musculoskeletal: Negative for acute joint swelling Skin: Negative for color change and wound.  Neurological: Negative for tremors and numbness other than noted  Psychiatric/Behavioral: Negative for decreased concentration or  hyperactivity.       Objective:   Physical Exam BP 130/80  Pulse 81  Temp(Src) 98.2 F (36.8 C) (Oral)  Ht 5\' 7"  (1.702 m)  Wt 230 lb (104.327 kg)  BMI 36.01 kg/m2  SpO2 99% VS noted,  Constitutional: Pt appears well-developed and well-nourished.  HENT: Head: NCAT.  Right Ear: External ear normal.  Left Ear: External ear normal.  ,Bilat tm's with mild erythema.  Max sinus areas mild tender.  Pharynx with mild erythema, no exudate Eyes: Conjunctivae and EOM are normal. Pupils are equal, round, and reactive to light.  Neck: Normal range of motion. Neck  supple.  Cardiovascular: Normal rate and regular rhythm.   Pulmonary/Chest: Effort normal and breath sounds normal.  - no rales or wheezing Neurological: Pt is alert. Not confused  Skin: Skin is warm. No erythema.  Psychiatric: Pt behavior is normal. Thought content normal.       Assessment & Plan:

## 2013-04-03 NOTE — Assessment & Plan Note (Signed)
stable overall by history and exam, recent data reviewed with pt, and pt to continue medical treatment as before,  to f/u any worsening symptoms or concerns Lab Results  Component Value Date   HGBA1C 7.1* 01/30/2013

## 2013-04-03 NOTE — Patient Instructions (Signed)
Please take all new medication as prescribed - the antibiotic and cough medicine Please continue all other medications as before, and refills have been done if requested. You can also take Delsym OTC for cough, and/or Mucinex (or it's generic off brand) for congestion, and tylenol as needed for pain.  Please remember to sign up for My Chart if you have not done so, as this will be important to you in the future with finding out test results, communicating by private email, and scheduling acute appointments online when needed.

## 2013-04-03 NOTE — Assessment & Plan Note (Signed)
stable overall by history and exam, recent data reviewed with pt, and pt to continue medical treatment as before,  to f/u any worsening symptoms or concerns BP Readings from Last 3 Encounters:  04/03/13 130/80  02/20/13 132/70  01/30/13 144/100

## 2013-06-05 ENCOUNTER — Telehealth: Payer: Self-pay

## 2013-06-05 MED ORDER — ATORVASTATIN CALCIUM 20 MG PO TABS: 20.0000 mg | ORAL_TABLET | Freq: Every day | ORAL | Status: DC

## 2013-06-05 NOTE — Telephone Encounter (Signed)
Done erx 

## 2013-06-05 NOTE — Telephone Encounter (Signed)
Patient called to request a refill atorvastatin 20 mg, currently not on list.

## 2013-06-15 ENCOUNTER — Other Ambulatory Visit (INDEPENDENT_AMBULATORY_CARE_PROVIDER_SITE_OTHER): Payer: BC Managed Care – PPO

## 2013-06-15 ENCOUNTER — Encounter: Payer: Self-pay | Admitting: Internal Medicine

## 2013-06-15 ENCOUNTER — Ambulatory Visit (INDEPENDENT_AMBULATORY_CARE_PROVIDER_SITE_OTHER): Payer: BC Managed Care – PPO | Admitting: Internal Medicine

## 2013-06-15 ENCOUNTER — Ambulatory Visit (INDEPENDENT_AMBULATORY_CARE_PROVIDER_SITE_OTHER)
Admission: RE | Admit: 2013-06-15 | Discharge: 2013-06-15 | Disposition: A | Discharge status: Home / Self Care | Payer: BC Managed Care – PPO | Source: Ambulatory Visit | Attending: Internal Medicine | Admitting: Internal Medicine

## 2013-06-15 VITALS — BP 140/88 | HR 91 | Temp 97.7°F | Ht 67.0 in | Wt 234.0 lb

## 2013-06-15 DIAGNOSIS — I10 Essential (primary) hypertension: Secondary | ICD-10-CM

## 2013-06-15 DIAGNOSIS — F3289 Other specified depressive episodes: Secondary | ICD-10-CM

## 2013-06-15 DIAGNOSIS — IMO0001 Reserved for inherently not codable concepts without codable children: Secondary | ICD-10-CM

## 2013-06-15 DIAGNOSIS — R079 Chest pain, unspecified: Secondary | ICD-10-CM

## 2013-06-15 DIAGNOSIS — Z0000 Encounter for general adult medical examination without abnormal findings: Secondary | ICD-10-CM

## 2013-06-15 DIAGNOSIS — F329 Major depressive disorder, single episode, unspecified: Secondary | ICD-10-CM

## 2013-06-15 LAB — BASIC METABOLIC PANEL
BUN: 7 mg/dL (ref 6–23)
CO2: 29 mEq/L (ref 19–32)
Calcium: 9.9 mg/dL (ref 8.4–10.5)
Chloride: 100 mEq/L (ref 96–112)
Glucose, Bld: 123 mg/dL — ABNORMAL HIGH (ref 70–99)
Potassium: 3.6 mEq/L (ref 3.5–5.1)
Sodium: 136 mEq/L (ref 135–145)

## 2013-06-15 LAB — HEPATIC FUNCTION PANEL
AST: 22 U/L (ref 0–37)
Albumin: 4.5 g/dL (ref 3.5–5.2)
Alkaline Phosphatase: 94 U/L (ref 39–117)
Bilirubin, Direct: 0.1 mg/dL (ref 0.0–0.3)
Total Protein: 8.5 g/dL — ABNORMAL HIGH (ref 6.0–8.3)

## 2013-06-15 LAB — LIPID PANEL
HDL: 51 mg/dL (ref >39.00)
Total CHOL/HDL Ratio: 3
VLDL: 23.6 mg/dL (ref 0.0–40.0)

## 2013-06-15 NOTE — Progress Notes (Signed)
Pre-visit discussion using our clinic review tool. No additional management support is needed unless otherwise documented below in the visit note.  

## 2013-06-15 NOTE — Progress Notes (Signed)
Subjective:    Patient ID: Jessica Daniels, female    DOB: 11/05/1960, 52 y.o.   MRN: [id number]  HPI  Here with  Several days mild left CP, seemingly located deeper to the tail of the breast, sharp and dull, non exertional, nonpleuritic but worse to lie on left side.  No assoc symptoms of sob, diaphoresis, n/v, palp, dizziness.  No fever, cough, wheezing. Pt denies orthopnea, PND, increased LE swelling, palpitations, dizziness or syncope  . Denies worsening depressive symptoms, suicidal ideation, or panic Past Medical History  Diagnosis Date  . Hypertension   . Hyperlipidemia   . Anxiety   . Depression   . Anemia   . Diabetes mellitus   . Allergic rhinitis    Past Surgical History  Procedure Laterality Date  . Cholecystectomy    . Lumbar laminectomy      reports that she has never smoked. She does not have any smokeless tobacco history on file. She reports that she does not drink alcohol or use illicit drugs. family history includes Diabetes in an other family member. Allergies  Allergen Reactions  . Crestor [Rosuvastatin]     Elevated liver enzymes   Current Outpatient Prescriptions on File Prior to Visit  Medication Sig Dispense Refill  . amLODipine (NORVASC) 10 MG tablet Take 1 tablet (10 mg total) by mouth daily.  90 tablet  3  . atorvastatin (LIPITOR) 20 MG tablet Take 1 tablet (20 mg total) by mouth daily.  90 tablet  3  . benazepril (LOTENSIN) 40 MG tablet TAKE 1 TABLET BY MOUTH EVERY DAY  90 tablet  3  . glucose blood (NOVA MAX TEST) test strip Use as instructed  100 each  12  . ibuprofen (ADVIL,MOTRIN) 800 MG tablet Take 1 tablet (800 mg total) by mouth 3 (three) times daily.  21 tablet  0  . metFORMIN (GLUCOPHAGE) 1000 MG tablet Take 1 tablet (1,000 mg total) by mouth daily with breakfast.  90 tablet  3  . methocarbamol (ROBAXIN) 500 MG tablet Take 1 tablet (500 mg total) by mouth 2 (two) times daily.  20 tablet  0  . sitaGLIPtin (JANUVIA) 50 MG tablet Take 1 tablet  (50 mg total) by mouth daily.  90 tablet  3  . traMADol (ULTRAM) 50 MG tablet Take 1 tablet (50 mg total) by mouth every 6 (six) hours as needed for pain.  60 tablet  1   No current facility-administered medications on file prior to visit.   Review of Systems  Constitutional: Negative for unexpected weight change, or unusual diaphoresis  HENT: Negative for tinnitus.   Eyes: Negative for photophobia and visual disturbance.  Respiratory: Negative for choking and stridor.   Gastrointestinal: Negative for vomiting and blood in stool.  Genitourinary: Negative for hematuria and decreased urine volume.  Musculoskeletal: Negative for acute joint swelling Skin: Negative for color change and wound.  Neurological: Negative for tremors and numbness other than noted  Psychiatric/Behavioral: Negative for decreased concentration or  hyperactivity.       Objective:   Physical Exam BP 140/88  Pulse 91  Temp(Src) 97.7 F (36.5 C) (Oral)  Ht 5\' 7"  (1.702 m)  Wt 234 lb (106.142 kg)  BMI 36.64 kg/m2  SpO2 94% VS noted,  Constitutional: Pt appears well-developed and well-nourished.  HENT: Head: NCAT.  Right Ear: External ear normal.  Left Ear: External ear normal.  Eyes: Conjunctivae and EOM are normal. Pupils are equal, round, and reactive to light.  Neck: Normal  range of motion. Neck supple.  Cardiovascular: Normal rate and regular rhythm.   Pulmonary/Chest: Effort normal and breath sounds normal.  Abd:  Soft, NT, non-distended, + BS Neurological: Pt is alert. Not confused  Skin: Skin is warm. No erythema.  Psychiatric: Pt behavior is normal. Thought content normal. not depressed affect    Assessment & Plan:

## 2013-06-15 NOTE — Assessment & Plan Note (Signed)
stable overall by history and exam, recent data reviewed with pt, and pt to continue medical treatment as before,  to f/u any worsening symptoms or concerns Lab Results  Component Value Date   WBC 5.1 01/30/2013   HGB 11.9* 01/30/2013   HCT 38.2 01/30/2013   PLT 377.0 01/30/2013   GLUCOSE 123* 06/15/2013   CHOL 153 06/15/2013   TRIG 118.0 06/15/2013   HDL 51.00 06/15/2013   LDLDIRECT 137.4 10/20/2009   LDLCALC 78 06/15/2013   ALT 17 06/15/2013   AST 22 06/15/2013   NA 136 06/15/2013   K 3.6 06/15/2013   CL 100 06/15/2013   CREATININE 0.9 06/15/2013   BUN 7 06/15/2013   CO2 29 06/15/2013   TSH 1.62 01/30/2013   HGBA1C 7.0* 06/15/2013   MICROALBUR 2.6* 01/30/2013    

## 2013-06-15 NOTE — Assessment & Plan Note (Signed)
stable overall by history and exam, recent data reviewed with pt, and pt to continue medical treatment as before,  to f/u any worsening symptoms or concerns BP Readings from Last 3 Encounters:  06/15/13 140/88  04/03/13 130/80  02/20/13 132/70

## 2013-06-15 NOTE — Assessment & Plan Note (Addendum)
Atypical., ECG reviewed as per emr, suspect msk but for cxr, to f/u any worsening symptoms or concerns

## 2013-06-15 NOTE — Patient Instructions (Addendum)
Please continue all other medications as before, including the ibuprofen for now Your EKG was OK today Please go to the XRAY Department in the Basement (go straight as you get off the elevator) for the x-ray testing You will be contacted by phone if any changes need to be made immediately.  Otherwise, you will receive a letter about your results with an explanation, but please check with MyChart first.  You will be contacted regarding the referral for: mammogram as you are due  Please go to the LAB in the Basement (turn left off the elevator) for the tests to be done today You will be contacted by phone if any changes need to be made immediately.  Otherwise, you will receive a letter about your results with an explanation, but please check with MyChart first.  Please remember to sign up for My Chart if you have not done so, as this will be important to you in the future with finding out test results, communicating by private email, and scheduling acute appointments online when needed.  Please return in 6 months, or sooner if needed

## 2013-07-02 ENCOUNTER — Ambulatory Visit
Admission: RE | Admit: 2013-07-02 | Discharge: 2013-07-02 | Disposition: A | Discharge status: Home / Self Care | Payer: BC Managed Care – PPO | Source: Ambulatory Visit | Attending: Internal Medicine | Admitting: Internal Medicine

## 2013-07-02 DIAGNOSIS — Z0000 Encounter for general adult medical examination without abnormal findings: Secondary | ICD-10-CM

## 2013-07-10 LAB — HM MAMMOGRAPHY

## 2013-08-16 ENCOUNTER — Telehealth: Payer: Self-pay | Admitting: *Deleted

## 2013-08-16 MED ORDER — METFORMIN HCL 1000 MG PO TABS: 1000.0000 mg | ORAL_TABLET | Freq: Every day | ORAL | Status: DC

## 2013-08-16 NOTE — Telephone Encounter (Signed)
Patient phoned & left messages on triage line at 0815 and [phone] demanding metformin refills. Last OV with PCP 06/17/14.  Refilled per protocol.  Patient notified.

## 2013-08-23 ENCOUNTER — Encounter: Payer: Self-pay | Admitting: Internal Medicine

## 2013-08-23 ENCOUNTER — Ambulatory Visit (INDEPENDENT_AMBULATORY_CARE_PROVIDER_SITE_OTHER): Payer: BC Managed Care – PPO | Admitting: Internal Medicine

## 2013-08-23 VITALS — BP 108/70 | HR 79 | Temp 98.1°F | Ht 67.0 in | Wt 237.1 lb

## 2013-08-23 DIAGNOSIS — E1165 Type 2 diabetes mellitus with hyperglycemia: Secondary | ICD-10-CM

## 2013-08-23 DIAGNOSIS — E119 Type 2 diabetes mellitus without complications: Secondary | ICD-10-CM

## 2013-08-23 DIAGNOSIS — Z0000 Encounter for general adult medical examination without abnormal findings: Secondary | ICD-10-CM

## 2013-08-23 DIAGNOSIS — IMO0001 Reserved for inherently not codable concepts without codable children: Secondary | ICD-10-CM

## 2013-08-23 MED ORDER — SITAGLIPTIN PHOSPHATE 50 MG PO TABS: 50.0000 mg | ORAL_TABLET | Freq: Every day | ORAL | Status: DC

## 2013-08-23 NOTE — Patient Instructions (Signed)
Please continue all other medications as before, and refills have been done if requested. Please have the pharmacy call with any other refills you may need. Please continue your efforts at being more active, low cholesterol diet, and weight control. You are otherwise up to date with prevention measures today. Please keep your appointments with your specialists as you may have planned No further lab work needed today Please call if you change your mind about the colonoscopy.   Please return in 6 months, or sooner if needed, with Lab testing done 3-5 days before

## 2013-08-23 NOTE — Progress Notes (Signed)
Pre-visit discussion using our clinic review tool. No additional management support is needed unless otherwise documented below in the visit note.  

## 2013-08-23 NOTE — Progress Notes (Signed)
Subjective:    Patient ID: Jessica Daniels, female    DOB: August 19, 1960, 53 y.o.   MRN: [id number]  HPI Here for wellness and f/u;  Overall doing ok;  Pt denies CP, worsening SOB, DOE, wheezing, orthopnea, PND, worsening LE edema, palpitations, dizziness or syncope.  Pt denies neurological change such as new headache, facial or extremity weakness.  Pt denies polydipsia, polyuria, or low sugar symptoms. Pt states overall good compliance with treatment and medications, good tolerability, and has been trying to follow lower cholesterol diet.  Pt denies worsening depressive symptoms, suicidal ideation or panic. No fever, night sweats, wt loss, loss of appetite, or other constitutional symptoms.  Pt states good ability with ADL's, has low fall risk, home safety reviewed and adequate, no other significant changes in hearing or vision, and only occasionally active with exercise. No acute complaints Past Medical History  Diagnosis Date  . Hypertension   . Hyperlipidemia   . Anxiety   . Depression   . Anemia   . Diabetes mellitus   . Allergic rhinitis    Past Surgical History  Procedure Laterality Date  . Cholecystectomy    . Lumbar laminectomy      reports that she has never smoked. She does not have any smokeless tobacco history on file. She reports that she does not drink alcohol or use illicit drugs. family history includes Diabetes in an other family member. Allergies  Allergen Reactions  . Crestor [Rosuvastatin]     Elevated liver enzymes   Current Outpatient Prescriptions on File Prior to Visit  Medication Sig Dispense Refill  . amLODipine (NORVASC) 10 MG tablet Take 1 tablet (10 mg total) by mouth daily.  90 tablet  3  . atorvastatin (LIPITOR) 20 MG tablet Take 1 tablet (20 mg total) by mouth daily.  90 tablet  3  . benazepril (LOTENSIN) 40 MG tablet TAKE 1 TABLET BY MOUTH EVERY DAY  90 tablet  3  . glucose blood (NOVA MAX TEST) test strip Use as instructed  100 each  12  .  ibuprofen (ADVIL,MOTRIN) 800 MG tablet Take 1 tablet (800 mg total) by mouth 3 (three) times daily.  21 tablet  0  . metFORMIN (GLUCOPHAGE) 1000 MG tablet Take 1 tablet (1,000 mg total) by mouth daily with breakfast.  90 tablet  3  . methocarbamol (ROBAXIN) 500 MG tablet Take 1 tablet (500 mg total) by mouth 2 (two) times daily.  20 tablet  0  . traMADol (ULTRAM) 50 MG tablet Take 1 tablet (50 mg total) by mouth every 6 (six) hours as needed for pain.  60 tablet  1   No current facility-administered medications on file prior to visit.   Review of Systems Constitutional: Negative for diaphoresis, activity change, appetite change or unexpected weight change.  HENT: Negative for hearing loss, ear pain, facial swelling, mouth sores and neck stiffness.   Eyes: Negative for pain, redness and visual disturbance.  Respiratory: Negative for shortness of breath and wheezing.   Cardiovascular: Negative for chest pain and palpitations.  Gastrointestinal: Negative for diarrhea, blood in stool, abdominal distention or other pain Genitourinary: Negative for hematuria, flank pain or change in urine volume.  Musculoskeletal: Negative for myalgias and joint swelling.  Skin: Negative for color change and wound.  Neurological: Negative for syncope and numbness. other than noted Hematological: Negative for adenopathy.  Psychiatric/Behavioral: Negative for hallucinations, self-injury, decreased concentration and agitation.      Objective:   Physical Exam BP 108/70  Pulse 79  Temp(Src) 98.1 F (36.7 C) (Oral)  Ht 5\' 7"  (1.702 m)  Wt 237 lb 2 oz (107.559 kg)  BMI 37.13 kg/m2  SpO2 97% VS noted,  Constitutional: Pt is oriented to person, place, and time. Appears well-developed and well-nourished. Lavella Lemons/obese Head: Normocephalic and atraumatic.  Right Ear: External ear normal.  Left Ear: External ear normal.  Nose: Nose normal.  Mouth/Throat: Oropharynx is clear and moist.  Eyes: Conjunctivae and EOM are  normal. Pupils are equal, round, and reactive to light.  Neck: Normal range of motion. Neck supple. No JVD present. No tracheal deviation present.  Cardiovascular: Normal rate, regular rhythm, normal heart sounds and intact distal pulses.   Pulmonary/Chest: Effort normal and breath sounds normal.  Abdominal: Soft. Bowel sounds are normal. There is no tenderness. No HSM  Musculoskeletal: Normal range of motion. Exhibits no edema.  Lymphadenopathy:  Has no cervical adenopathy.  Neurological: Pt is alert and oriented to person, place, and time. Pt has normal reflexes. No cranial nerve deficit.  Skin: Skin is warm and dry. No rash noted.  Psychiatric:  Has  normal mood and affect. Behavior is normal.     Assessment & Plan:

## 2013-08-24 ENCOUNTER — Telehealth: Payer: Self-pay

## 2013-08-24 NOTE — Telephone Encounter (Signed)
[employer] Education material mailed to patient

## 2013-08-25 NOTE — Assessment & Plan Note (Signed)

## 2013-08-25 NOTE — Assessment & Plan Note (Signed)
stable overall by history and exam, recent data reviewed with pt, and pt to continue medical treatment as before,  to f/u any worsening symptoms or concerns Lab Results  Component Value Date   HGBA1C 7.0* 06/15/2013

## 2013-10-08 ENCOUNTER — Encounter: Payer: Self-pay | Admitting: Internal Medicine

## 2013-10-08 ENCOUNTER — Ambulatory Visit (INDEPENDENT_AMBULATORY_CARE_PROVIDER_SITE_OTHER): Payer: BC Managed Care – PPO | Admitting: Internal Medicine

## 2013-10-08 VITALS — BP 140/84 | HR 76 | Temp 98.5°F | Resp 16 | Wt 233.0 lb

## 2013-10-08 DIAGNOSIS — J069 Acute upper respiratory infection, unspecified: Secondary | ICD-10-CM

## 2013-10-08 MED ORDER — AZITHROMYCIN 250 MG PO TABS: ORAL_TABLET | ORAL | Status: DC

## 2013-10-08 NOTE — Progress Notes (Signed)
   Subjective:    Patient ID: Jessica Daniels, female    DOB: 03/22/1961, 53 y.o.   MRN: [id number]  URI  This is a new problem. The current episode started in the past 7 days. The problem has been gradually worsening. The maximum temperature recorded prior to her arrival was 100 - 100.9 F. Associated symptoms include congestion, coughing, headaches, joint pain, rhinorrhea and sinus pain. Pertinent negatives include no chest pain or nausea. She has tried acetaminophen for the symptoms. The treatment provided no relief.      Review of Systems  HENT: Positive for congestion and rhinorrhea.   Respiratory: Positive for cough.   Cardiovascular: Negative for chest pain.  Gastrointestinal: Negative for nausea.  Musculoskeletal: Positive for joint pain.  Neurological: Positive for headaches.       Objective:   Physical Exam  Constitutional: She appears well-developed. No distress.  HENT:  Head: Normocephalic.  Right Ear: External ear normal.  Left Ear: External ear normal.  Nose: Nose normal.  Mouth/Throat: Oropharynx is clear and moist.  eryth throat  Eyes: Conjunctivae are normal. Pupils are equal, round, and reactive to light. Right eye exhibits no discharge. Left eye exhibits no discharge.  Neck: Normal range of motion. Neck supple. No JVD present. No tracheal deviation present. No thyromegaly present.  Cardiovascular: Normal rate, regular rhythm and normal heart sounds.   Pulmonary/Chest: No stridor. No respiratory distress. She has no wheezes.  Abdominal: Soft. Bowel sounds are normal. She exhibits no distension and no mass. There is no tenderness. There is no rebound and no guarding.  Musculoskeletal: She exhibits no edema and no tenderness.  Lymphadenopathy:    She has no cervical adenopathy.  Neurological: She displays normal reflexes. No cranial nerve deficit. She exhibits normal muscle tone. Coordination normal.  Skin: No rash noted. No erythema.  Psychiatric: She has a  normal mood and affect. Her behavior is normal. Judgment and thought content normal.          Assessment & Plan:

## 2013-10-08 NOTE — Assessment & Plan Note (Signed)
To work on 4/9 if ok [employer] if worse

## 2013-10-08 NOTE — Patient Instructions (Addendum)
Use over-the-counter  "cold" medicines  such as "Afrin" nasal spray for nasal congestion as directed instead. Use" Delsym" or" Robitussin" cough syrup varietis for cough.  You can use plain "Tylenol" or "Advil" for fever, chills and achyness.   "Common cold" symptoms are usually triggered by a virus.  The antibiotics are usually not necessary. On average, a" viral cold" illness would take 4-7 days to resolve.   Please, make an appointment if you are not better or if you're worse.  To work on 4/9 if ok

## 2013-10-08 NOTE — Progress Notes (Signed)
Pre visit review using our clinic review tool, if applicable. No additional management support is needed unless otherwise documented below in the visit note. 

## 2013-11-01 ENCOUNTER — Ambulatory Visit (INDEPENDENT_AMBULATORY_CARE_PROVIDER_SITE_OTHER): Payer: BC Managed Care – PPO | Admitting: Internal Medicine

## 2013-11-01 ENCOUNTER — Encounter: Payer: Self-pay | Admitting: Internal Medicine

## 2013-11-01 VITALS — BP 128/88 | HR 81 | Temp 98.6°F | Ht 67.0 in | Wt 236.4 lb

## 2013-11-01 DIAGNOSIS — F3289 Other specified depressive episodes: Secondary | ICD-10-CM

## 2013-11-01 DIAGNOSIS — F329 Major depressive disorder, single episode, unspecified: Secondary | ICD-10-CM

## 2013-11-01 DIAGNOSIS — I10 Essential (primary) hypertension: Secondary | ICD-10-CM

## 2013-11-01 DIAGNOSIS — R519 Headache, unspecified: Secondary | ICD-10-CM | POA: Insufficient documentation

## 2013-11-01 DIAGNOSIS — R51 Headache: Secondary | ICD-10-CM

## 2013-11-01 MED ORDER — KETOROLAC TROMETHAMINE 30 MG/ML IJ SOLN
30.0000 mg | Freq: Once | INTRAMUSCULAR | Status: AC
  Administered 2013-11-01: 30 mg via INTRAMUSCULAR

## 2013-11-01 MED ORDER — SUMATRIPTAN SUCCINATE 100 MG PO TABS: 100.0000 mg | ORAL_TABLET | ORAL | Status: DC | PRN

## 2013-11-01 NOTE — Assessment & Plan Note (Addendum)
stable overall by history and exam, recent data reviewed with pt, and pt to continue medical treatment as before,  to f/u any worsening symptoms or concerns BP Readings from Last 3 Encounters:  11/01/13 128/88  10/08/13 140/84  08/23/13 108/70

## 2013-11-01 NOTE — Assessment & Plan Note (Signed)
Suspect migraine variant, for toradol 30 mg im, imitrex prn,  to f/u any worsening symptoms or concerns

## 2013-11-01 NOTE — Progress Notes (Signed)
Pre visit review using our clinic review tool, if applicable. No additional management support is needed unless otherwise documented below in the visit note. 

## 2013-11-01 NOTE — Patient Instructions (Signed)
You had the pain shot today (toradol)  Please take all new medication as prescribed  - the imitrex  Your Blood Pressure was Good today  Please continue all other medications as before, and refills have been done if requested. Please have the pharmacy call with any other refills you may need.  Please continue your efforts at being more active, low cholesterol diet, and weight control.  OK to drive   You are given the work note

## 2013-11-01 NOTE — Progress Notes (Signed)
Subjective:    Patient ID: Jessica Daniels, female    DOB: 04/19/1961, 53 y.o.   MRN: [id number]  HPI  Here with 1 wk onset headache, intermittent, right side of head, sometimes frontal as well, sharp and lightning bolt like occasionally, has new contacts but has been going back and  forther between glasses and contacts.  Pt denies fever, wt loss, night sweats, loss of appetite, or other constitutional symptom  No pirio signifcant hx of migraine, no photosens or phonosensitivity, no n/v, Pt denies new neurological symptoms , or facial or extremity weakness or numbness  Has been outside more in the past few wks, no signifcnat sinus symptoms.  Relaxation sees to help, but still feels pain with sleep.  Worried her BP is up and cause of pain Denies worsening depressive symptoms, suicidal ideation, or panic Past Medical History  Diagnosis Date  . Hypertension   . Hyperlipidemia   . Anxiety   . Depression   . Anemia   . Diabetes mellitus   . Allergic rhinitis    Past Surgical History  Procedure Laterality Date  . Cholecystectomy    . Lumbar laminectomy      reports that she has never smoked. She does not have any smokeless tobacco history on file. She reports that she does not drink alcohol or use illicit drugs. family history includes Diabetes in an other family member. Allergies  Allergen Reactions  . Crestor [Rosuvastatin]     Elevated liver enzymes   Current Outpatient Prescriptions on File Prior to Visit  Medication Sig Dispense Refill  . amLODipine (NORVASC) 10 MG tablet Take 1 tablet (10 mg total) by mouth daily.  90 tablet  3  . atorvastatin (LIPITOR) 20 MG tablet Take 1 tablet (20 mg total) by mouth daily.  90 tablet  3  . benazepril (LOTENSIN) 40 MG tablet TAKE 1 TABLET BY MOUTH EVERY DAY  90 tablet  3  . glucose blood (NOVA MAX TEST) test strip Use as instructed  100 each  12  . ibuprofen (ADVIL,MOTRIN) 800 MG tablet Take 1 tablet (800 mg total) by mouth 3 (three) times  daily.  21 tablet  0  . metFORMIN (GLUCOPHAGE) 1000 MG tablet Take 1 tablet (1,000 mg total) by mouth daily with breakfast.  90 tablet  3  . methocarbamol (ROBAXIN) 500 MG tablet Take 1 tablet (500 mg total) by mouth 2 (two) times daily.  20 tablet  0  . sitaGLIPtin (JANUVIA) 50 MG tablet Take 1 tablet (50 mg total) by mouth daily.  30 tablet  11  . traMADol (ULTRAM) 50 MG tablet Take 1 tablet (50 mg total) by mouth every 6 (six) hours as needed for pain.  60 tablet  1   No current facility-administered medications on file prior to visit.     Review of Systems  Constitutional: Negative for unusual diaphoresis or other sweats  HENT: Negative for ringing in ear Eyes: Negative for double vision or worsening visual disturbance.  Respiratory: Negative for choking and stridor.   Gastrointestinal: Negative for vomiting or other signifcant bowel change Genitourinary: Negative for hematuria or decreased urine volume.  Musculoskeletal: Negative for other MSK pain or swelling Skin: Negative for color change and worsening wound.  Neurological: Negative for tremors and numbness other than noted  Psychiatric/Behavioral: Negative for decreased concentration or agitation other than above       Objective:   Physical Exam BP 128/88  Pulse 81  Temp(Src) 98.6 F (37 C) (Oral)  Ht 5\' 7"  (1.702 m)  Wt 236 lb 6 oz (107.219 kg)  BMI 37.01 kg/m2  SpO2 97% VS noted,  Constitutional: Pt appears well-developed, well-nourished.  HENT: Head: NCAT.  Right Ear: External ear normal.  Left Ear: External ear normal.  Eyes: . Pupils are equal, round, and reactive to light. Conjunctivae and EOM are normal Neck: Normal range of motion. Neck supple.  Cardiovascular: Normal rate and regular rhythm.   Pulmonary/Chest: Effort normal and breath sounds normal.  Abd:  Soft, NT, ND, + BS Neurological: Pt is alert. Not confused , motor grossly intact, sens/dtr intact, cn 2-12 intact Skin: Skin is warm. No  rash Psychiatric: Pt behavior is normal. No agitation.      Assessment & Plan:

## 2013-11-01 NOTE — Assessment & Plan Note (Signed)
stable overall by history and exam, recent data reviewed with pt, and pt to continue medical treatment as before,  to f/u any worsening symptoms or concerns Lab Results  Component Value Date   WBC 5.1 01/30/2013   HGB 11.9* 01/30/2013   HCT 38.2 01/30/2013   PLT 377.0 01/30/2013   GLUCOSE 123* 06/15/2013   CHOL 153 06/15/2013   TRIG 118.0 06/15/2013   HDL 51.00 06/15/2013   LDLDIRECT 137.4 10/20/2009   LDLCALC 78 06/15/2013   ALT 17 06/15/2013   AST 22 06/15/2013   NA 136 06/15/2013   K 3.6 06/15/2013   CL 100 06/15/2013   CREATININE 0.9 06/15/2013   BUN 7 06/15/2013   CO2 29 06/15/2013   TSH 1.62 01/30/2013   HGBA1C 7.0* 06/15/2013   MICROALBUR 2.6* 01/30/2013

## 2013-11-02 ENCOUNTER — Telehealth: Payer: Self-pay | Admitting: Internal Medicine

## 2013-11-02 NOTE — Telephone Encounter (Signed)
emmi mailed to patient °

## 2014-02-11 ENCOUNTER — Other Ambulatory Visit: Payer: Self-pay | Admitting: Internal Medicine

## 2014-02-20 ENCOUNTER — Other Ambulatory Visit: Payer: Self-pay | Admitting: Internal Medicine

## 2014-02-20 ENCOUNTER — Encounter: Payer: Self-pay | Admitting: Internal Medicine

## 2014-02-20 ENCOUNTER — Ambulatory Visit (INDEPENDENT_AMBULATORY_CARE_PROVIDER_SITE_OTHER): Payer: BC Managed Care – PPO | Admitting: Internal Medicine

## 2014-02-20 ENCOUNTER — Other Ambulatory Visit (INDEPENDENT_AMBULATORY_CARE_PROVIDER_SITE_OTHER): Payer: BC Managed Care – PPO

## 2014-02-20 VITALS — BP 122/88 | HR 81 | Temp 98.4°F | Ht 67.0 in | Wt 234.5 lb

## 2014-02-20 DIAGNOSIS — Z0000 Encounter for general adult medical examination without abnormal findings: Secondary | ICD-10-CM

## 2014-02-20 DIAGNOSIS — E119 Type 2 diabetes mellitus without complications: Secondary | ICD-10-CM

## 2014-02-20 DIAGNOSIS — M79671 Pain in right foot: Secondary | ICD-10-CM | POA: Insufficient documentation

## 2014-02-20 DIAGNOSIS — M79672 Pain in left foot: Secondary | ICD-10-CM

## 2014-02-20 DIAGNOSIS — E785 Hyperlipidemia, unspecified: Secondary | ICD-10-CM

## 2014-02-20 DIAGNOSIS — M79609 Pain in unspecified limb: Secondary | ICD-10-CM

## 2014-02-20 DIAGNOSIS — I10 Essential (primary) hypertension: Secondary | ICD-10-CM

## 2014-02-20 LAB — LIPID PANEL
CHOLESTEROL: 154 mg/dL (ref 0–200)
HDL: 44.1 mg/dL (ref >39.00)
LDL Cholesterol: 87 mg/dL (ref 0–99)
NonHDL: 109.9
Total CHOL/HDL Ratio: 3
Triglycerides: 116 mg/dL (ref 0.0–149.0)
VLDL: 23.2 mg/dL (ref 0.0–40.0)

## 2014-02-20 LAB — HEPATIC FUNCTION PANEL
ALK PHOS: 94 U/L (ref 39–117)
ALT: 12 U/L (ref 0–35)
AST: 19 U/L (ref 0–37)
Albumin: 4.3 g/dL (ref 3.5–5.2)
BILIRUBIN DIRECT: 0.1 mg/dL (ref 0.0–0.3)
TOTAL PROTEIN: 8.5 g/dL — AB (ref 6.0–8.3)
Total Bilirubin: 0.5 mg/dL (ref 0.2–1.2)

## 2014-02-20 LAB — BASIC METABOLIC PANEL
BUN: 10 mg/dL (ref 6–23)
CALCIUM: 9.8 mg/dL (ref 8.4–10.5)
CO2: 28 meq/L (ref 19–32)
Chloride: 102 mEq/L (ref 96–112)
Creatinine, Ser: 1 mg/dL (ref 0.4–1.2)
GFR: 75.07 mL/min (ref >60.00)
GLUCOSE: 143 mg/dL — AB (ref 70–99)
Potassium: 4.2 mEq/L (ref 3.5–5.1)
SODIUM: 138 meq/L (ref 135–145)

## 2014-02-20 LAB — HEMOGLOBIN A1C: Hgb A1c MFr Bld: 7.3 % — ABNORMAL HIGH (ref 4.6–6.5)

## 2014-02-20 MED ORDER — SITAGLIPTIN PHOSPHATE 100 MG PO TABS: 100.0000 mg | ORAL_TABLET | Freq: Every day | ORAL | Status: DC

## 2014-02-20 MED ORDER — ONETOUCH ULTRA 2 W/DEVICE KIT: PACK | Status: DC

## 2014-02-20 MED ORDER — LANCETS MISC: Status: DC

## 2014-02-20 MED ORDER — SITAGLIPTIN PHOSPHATE 50 MG PO TABS
50.0000 mg | ORAL_TABLET | Freq: Every day | ORAL | Status: DC
Start: 2014-02-20 — End: 2014-02-20

## 2014-02-20 MED ORDER — BENAZEPRIL HCL 40 MG PO TABS: 40.0000 mg | ORAL_TABLET | Freq: Every day | ORAL | Status: DC

## 2014-02-20 MED ORDER — METFORMIN HCL 1000 MG PO TABS: 1000.0000 mg | ORAL_TABLET | Freq: Every day | ORAL | Status: DC

## 2014-02-20 MED ORDER — AMLODIPINE BESYLATE 10 MG PO TABS: 10.0000 mg | ORAL_TABLET | Freq: Every day | ORAL | Status: DC

## 2014-02-20 MED ORDER — ATORVASTATIN CALCIUM 20 MG PO TABS: 20.0000 mg | ORAL_TABLET | Freq: Every day | ORAL | Status: DC

## 2014-02-20 MED ORDER — GLUCOSE BLOOD VI STRP: ORAL_STRIP | Status: DC

## 2014-02-20 NOTE — Assessment & Plan Note (Signed)
stable overall by history and exam, recent data reviewed with pt, and pt to continue medical treatment as before,  to f/u any worsening symptoms or concerns Lab Results  Component Value Date   LDLCALC 78 06/15/2013

## 2014-02-20 NOTE — Assessment & Plan Note (Signed)
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
## 2014-02-20 NOTE — Assessment & Plan Note (Signed)
stable overall by history and exam, recent data reviewed with pt, and pt to continue medical treatment as before,  to f/u any worsening symptoms or concerns BP Readings from Last 3 Encounters:  02/20/14 122/88  11/01/13 128/88  10/08/13 140/84

## 2014-02-20 NOTE — Progress Notes (Signed)
Pre visit review using our clinic review tool, if applicable. No additional management support is needed unless otherwise documented below in the visit note. 

## 2014-02-20 NOTE — Patient Instructions (Signed)
Please continue all other medications as before, and refills have been done if requested  - the glucometer and the supplies  Please have the pharmacy call with any other refills you may need.  Please continue your efforts at being more active, low cholesterol diet, and weight control.  Please keep your appointments with your specialists as you may have planned  You are given the letter today for the work boots  Please return in 6 months, or sooner if needed, with Lab testing done 3-5 days before

## 2014-02-20 NOTE — Assessment & Plan Note (Signed)
stable overall by history and exam, recent data reviewed with pt, and pt to continue medical treatment as before,  to f/u any worsening symptoms or concerns Lab Results  Component Value Date   HGBA1C 7.0* 06/15/2013   For f/u labs

## 2014-06-04 ENCOUNTER — Ambulatory Visit (INDEPENDENT_AMBULATORY_CARE_PROVIDER_SITE_OTHER): Payer: BC Managed Care – PPO | Admitting: Internal Medicine

## 2014-06-04 ENCOUNTER — Other Ambulatory Visit (INDEPENDENT_AMBULATORY_CARE_PROVIDER_SITE_OTHER): Payer: BC Managed Care – PPO

## 2014-06-04 ENCOUNTER — Encounter: Payer: Self-pay | Admitting: Internal Medicine

## 2014-06-04 VITALS — BP 132/87 | HR 91 | Temp 98.5°F | Ht 68.0 in | Wt 235.0 lb

## 2014-06-04 DIAGNOSIS — E119 Type 2 diabetes mellitus without complications: Secondary | ICD-10-CM

## 2014-06-04 DIAGNOSIS — E785 Hyperlipidemia, unspecified: Secondary | ICD-10-CM | POA: Diagnosis not present

## 2014-06-04 DIAGNOSIS — E083299 Diabetes mellitus due to underlying condition with mild nonproliferative diabetic retinopathy without macular edema, unspecified eye: Secondary | ICD-10-CM

## 2014-06-04 DIAGNOSIS — E08329 Diabetes mellitus due to underlying condition with mild nonproliferative diabetic retinopathy without macular edema: Secondary | ICD-10-CM

## 2014-06-04 DIAGNOSIS — I10 Essential (primary) hypertension: Secondary | ICD-10-CM

## 2014-06-04 DIAGNOSIS — Z0189 Encounter for other specified special examinations: Secondary | ICD-10-CM

## 2014-06-04 DIAGNOSIS — Z0000 Encounter for general adult medical examination without abnormal findings: Secondary | ICD-10-CM

## 2014-06-04 LAB — HEMOGLOBIN A1C: Hgb A1c MFr Bld: 8 % — ABNORMAL HIGH (ref 4.6–6.5)

## 2014-06-04 MED ORDER — PIOGLITAZONE HCL 30 MG PO TABS: 30.0000 mg | ORAL_TABLET | Freq: Every day | ORAL | Status: DC

## 2014-06-04 NOTE — Progress Notes (Signed)
Pre visit review using our clinic review tool, if applicable. No additional management support is needed unless otherwise documented below in the visit note. 

## 2014-06-04 NOTE — Progress Notes (Signed)
   Subjective:    Patient ID: Jessica Daniels, female    DOB: 08/26/1960, [age] y.o.   MRN: [id number]  HPI  Here to f/u; overall doing ok,  Pt denies chest pain, increased sob or doe, wheezing, orthopnea, PND, increased LE swelling, palpitations, dizziness or syncope.  Pt denies polydipsia, polyuria, or low sugar symptoms such as weakness or confusion improved with po intake.  Pt denies new neurological symptoms such as new headache, or facial or extremity weakness or numbness.   Pt states overall good compliance with meds, has been trying to follow lower cholesterol, diabetic diet, with wt overall stable,  but little exercise however. Wt Readings from Last 3 Encounters:  06/04/14 235 lb (106.595 kg)  02/20/14 234 lb 8 oz (106.369 kg)  11/01/13 236 lb 6 oz (107.219 kg)  No recent diet change, but sugars in 300's.  Good complaicne with meds, despite increased januvia last visit. Started back to the GYM but activity really about the same recently.  Mentions she stoped norethindrone 5 mg daily per GYN b/c of pain she attributes. Past Medical History  Diagnosis Date  . Hypertension   . Hyperlipidemia   . Anxiety   . Depression   . Anemia   . Diabetes mellitus   . Allergic rhinitis    Past Surgical History  Procedure Laterality Date  . Cholecystectomy    . Lumbar laminectomy      reports that she has never smoked. She does not have any smokeless tobacco history on file. She reports that she does not drink alcohol or use illicit drugs. family history includes Diabetes in an other family member. Allergies  Allergen Reactions  . Crestor [Rosuvastatin]     Elevated liver enzymes    Review of Systems  Constitutional: Negative for unusual diaphoresis or other sweats  HENT: Negative for ringing in ear Eyes: Negative for double vision or worsening visual disturbance.  Respiratory: Negative for choking and stridor.   Gastrointestinal: Negative for vomiting or other signifcant bowel  change Genitourinary: Negative for hematuria or decreased urine volume.  Musculoskeletal: Negative for other MSK pain or swelling Skin: Negative for color change and worsening wound.  Neurological: Negative for tremors and numbness other than noted  Psychiatric/Behavioral: Negative for decreased concentration or agitation other than above       Objective:   Physical Exam BP 132/87 mmHg  Pulse 91  Temp(Src) 98.5 F (36.9 C) (Oral)  Ht 5\' 8"  (1.727 m)  Wt 235 lb (106.595 kg)  BMI 35.74 kg/m2  SpO2 97% VS noted,  Constitutional: Pt appears well-developed, well-nourished.  HENT: Head: NCAT.  Right Ear: External ear normal.  Left Ear: External ear normal.  Eyes: . Pupils are equal, round, and reactive to light. Conjunctivae and EOM are normal Neck: Normal range of motion. Neck supple.  Cardiovascular: Normal rate and regular rhythm.   Pulmonary/Chest: Effort normal and breath sounds normal.  Abd:  Soft, NT, ND, + BS Neurological: Pt is alert. Not confused , motor grossly intact Skin: Skin is warm. No rash Psychiatric: Pt behavior is normal. No agitation.     Assessment & Plan:

## 2014-06-04 NOTE — Patient Instructions (Addendum)
Please take all new medication as prescribed  - the actos at 30 mg per day  Please continue all other medications as before, and refills have been done if requested.  Please have the pharmacy call with any other refills you may need.  Please continue your efforts at being more active, low cholesterol diabetic diet, and weight control.  Please keep your appointments with your specialists as you may have planned  Please go to the LAB in the Basement (turn left off the elevator) for the tests to be done today  You will be contacted by phone if any changes need to be made immediately.  Otherwise, you will receive a letter about your results with an explanation, but please check with MyChart first.  Please return in 3 months, or sooner if needed, with Lab testing done 3-5 days before

## 2014-06-05 LAB — BASIC METABOLIC PANEL
BUN: 12 mg/dL (ref 6–23)
CALCIUM: 9.8 mg/dL (ref 8.4–10.5)
CHLORIDE: 104 meq/L (ref 96–112)
CO2: 20 mEq/L (ref 19–32)
CREATININE: 1.1 mg/dL (ref 0.4–1.2)
GFR: 66.47 mL/min (ref >60.00)
Glucose, Bld: 95 mg/dL (ref 70–99)
Potassium: 4.1 mEq/L (ref 3.5–5.1)
Sodium: 135 mEq/L (ref 135–145)

## 2014-06-05 LAB — LIPID PANEL
CHOL/HDL RATIO: 4
Cholesterol: 142 mg/dL (ref 0–200)
HDL: 34.6 mg/dL — AB (ref >39.00)
LDL Cholesterol: 69 mg/dL (ref 0–99)
NONHDL: 107.4
Triglycerides: 194 mg/dL — ABNORMAL HIGH (ref 0.0–149.0)
VLDL: 38.8 mg/dL (ref 0.0–40.0)

## 2014-06-05 LAB — HEPATIC FUNCTION PANEL
ALT: 18 U/L (ref 0–35)
AST: 23 U/L (ref 0–37)
Albumin: 4.9 g/dL (ref 3.5–5.2)
Alkaline Phosphatase: 101 U/L (ref 39–117)
BILIRUBIN DIRECT: 0 mg/dL (ref 0.0–0.3)
Total Bilirubin: 0.4 mg/dL (ref 0.2–1.2)
Total Protein: 8.9 g/dL — ABNORMAL HIGH (ref 6.0–8.3)

## 2014-06-06 ENCOUNTER — Other Ambulatory Visit: Payer: Self-pay | Admitting: Internal Medicine

## 2014-06-06 ENCOUNTER — Encounter: Payer: Self-pay | Admitting: Internal Medicine

## 2014-06-06 MED ORDER — PIOGLITAZONE HCL 45 MG PO TABS: 45.0000 mg | ORAL_TABLET | Freq: Every day | ORAL | Status: DC

## 2014-06-08 NOTE — Assessment & Plan Note (Addendum)
Mild uncontrolled, o/w stable overall by history and exam, recent data reviewed with pt, and pt to continue medical treatment as before except add actos 30 mg per day,  to f/u any worsening symptoms or concerns Lab Results  Component Value Date   HGBA1C 8.0* 06/04/2014

## 2014-06-08 NOTE — Assessment & Plan Note (Signed)
stable overall by history and exam, recent data reviewed with pt, and pt to continue medical treatment as before,  to f/u any worsening symptoms or concerns Lab Results  Component Value Date   LDLCALC 69 06/04/2014

## 2014-06-08 NOTE — Assessment & Plan Note (Signed)
stable overall by history and exam, recent data reviewed with pt, and pt to continue medical treatment as before,  to f/u any worsening symptoms or concerns BP Readings from Last 3 Encounters:  06/04/14 132/87  02/20/14 122/88  11/01/13 128/88

## 2014-06-10 ENCOUNTER — Other Ambulatory Visit: Payer: Self-pay | Admitting: Internal Medicine

## 2014-08-05 ENCOUNTER — Emergency Department (HOSPITAL_COMMUNITY)
Admission: EM | Admit: 2014-08-05 | Discharge: 2014-08-05 | Disposition: A | Discharge status: Home / Self Care | Payer: BC Managed Care – PPO | Attending: Emergency Medicine | Admitting: Emergency Medicine

## 2014-08-05 ENCOUNTER — Encounter (HOSPITAL_COMMUNITY): Payer: Self-pay | Admitting: Emergency Medicine

## 2014-08-05 DIAGNOSIS — I10 Essential (primary) hypertension: Secondary | ICD-10-CM | POA: Diagnosis not present

## 2014-08-05 DIAGNOSIS — Z862 Personal history of diseases of the blood and blood-forming organs and certain disorders involving the immune mechanism: Secondary | ICD-10-CM | POA: Diagnosis not present

## 2014-08-05 DIAGNOSIS — Z79899 Other long term (current) drug therapy: Secondary | ICD-10-CM | POA: Diagnosis not present

## 2014-08-05 DIAGNOSIS — Y92410 Unspecified street and highway as the place of occurrence of the external cause: Secondary | ICD-10-CM | POA: Diagnosis not present

## 2014-08-05 DIAGNOSIS — F419 Anxiety disorder, unspecified: Secondary | ICD-10-CM | POA: Diagnosis not present

## 2014-08-05 DIAGNOSIS — Z9889 Other specified postprocedural states: Secondary | ICD-10-CM | POA: Insufficient documentation

## 2014-08-05 DIAGNOSIS — Y998 Other external cause status: Secondary | ICD-10-CM | POA: Insufficient documentation

## 2014-08-05 DIAGNOSIS — Y9389 Activity, other specified: Secondary | ICD-10-CM | POA: Insufficient documentation

## 2014-08-05 DIAGNOSIS — S39012A Strain of muscle, fascia and tendon of lower back, initial encounter: Secondary | ICD-10-CM | POA: Insufficient documentation

## 2014-08-05 DIAGNOSIS — E119 Type 2 diabetes mellitus without complications: Secondary | ICD-10-CM | POA: Insufficient documentation

## 2014-08-05 DIAGNOSIS — F329 Major depressive disorder, single episode, unspecified: Secondary | ICD-10-CM | POA: Diagnosis not present

## 2014-08-05 DIAGNOSIS — Z791 Long term (current) use of non-steroidal anti-inflammatories (NSAID): Secondary | ICD-10-CM | POA: Insufficient documentation

## 2014-08-05 DIAGNOSIS — S3992XA Unspecified injury of lower back, initial encounter: Secondary | ICD-10-CM | POA: Diagnosis present

## 2014-08-05 MED ORDER — METHOCARBAMOL 500 MG PO TABS: 500.0000 mg | ORAL_TABLET | Freq: Two times a day (BID) | ORAL | Status: DC

## 2014-08-05 MED ORDER — TRAMADOL HCL 50 MG PO TABS: 50.0000 mg | ORAL_TABLET | Freq: Four times a day (QID) | ORAL | Status: DC | PRN
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
## 2014-08-05 NOTE — Discharge Instructions (Signed)
Recommend that you take ibuprofen or Aleve with Robaxin as prescribed for pain control. Alternate ice and heat to areas of injury for muscle spasm and swelling. You may take tramadol as needed for severe pain. Follow-up with your primary care doctor for a recheck of symptoms in one week. Return to the emergency department if symptoms worsen.  Back Pain, Adult Low back pain is very common. About 1 in 5 people have back pain.The cause of low back pain is rarely dangerous. The pain often gets better over time.About half of people with a sudden onset of back pain feel better in just 2 weeks. About 8 in 10 people feel better by 6 weeks.  CAUSES Some common causes of back pain include:  Strain of the muscles or ligaments supporting the spine.  Wear and tear (degeneration) of the spinal discs.  Arthritis.  Direct injury to the back. DIAGNOSIS Most of the time, the direct cause of low back pain is not known.However, back pain can be treated effectively even when the exact cause of the pain is unknown.Answering your caregiver's questions about your overall health and symptoms is one of the most accurate ways to make sure the cause of your pain is not dangerous. If your caregiver needs more information, he or she may order lab work or imaging tests (X-rays or MRIs).However, even if imaging tests show changes in your back, this usually does not require surgery. HOME CARE INSTRUCTIONS For many people, back pain returns.Since low back pain is rarely dangerous, it is often a condition that people can learn to [facility] their own.   Remain active. It is stressful on the back to sit or stand in one place. Do not sit, drive, or stand in one place for more than 30 minutes at a time. Take short walks on level surfaces as soon as pain allows.Try to increase the length of time you walk each day.  Do not stay in bed.Resting more than 1 or 2 days can delay your recovery.  Do not avoid exercise or work.Your  body is made to move.It is not dangerous to be active, even though your back may hurt.Your back will likely heal faster if you return to being active before your pain is gone.  Pay attention to your body when you bend and lift. Many people have less discomfortwhen lifting if they bend their knees, keep the load close to their bodies,and avoid twisting. Often, the most comfortable positions are those that put less stress on your recovering back.  Find a comfortable position to sleep. Use a firm mattress and lie on your side with your knees slightly bent. If you lie on your back, put a pillow under your knees.  Only take over-the-counter or prescription medicines as directed by your caregiver. Over-the-counter medicines to reduce pain and inflammation are often the most helpful.Your caregiver may prescribe muscle relaxant drugs.These medicines help dull your pain so you can more quickly return to your normal activities and healthy exercise.  Put ice on the injured area.  Put ice in a plastic bag.  Place a towel between your skin and the bag.  Leave the ice on for 15-20 minutes, 03-04 times a day for the first 2 to 3 days. After that, ice and heat may be alternated to reduce pain and spasms.  Ask your caregiver about trying back exercises and gentle massage. This may be of some benefit.  Avoid feeling anxious or stressed.Stress increases muscle tension and can worsen back pain.It is  important to recognize when you are anxious or stressed and learn ways to manage it.Exercise is a great option. SEEK MEDICAL CARE IF:  You have pain that is not relieved with rest or medicine.  You have pain that does not improve in 1 week.  You have new symptoms.  You are generally not feeling well. SEEK IMMEDIATE MEDICAL CARE IF:   You have pain that radiates from your back into your legs.  You develop new bowel or bladder control problems.  You have unusual weakness or numbness in your arms or  legs.  You develop nausea or vomiting.  You develop abdominal pain.  You feel faint. Document Released: 06/21/2005 Document Revised: 12/21/2011 Document Reviewed: 10/23/2013 [facility] Patient Information 2015 [place], [place]. This information is not intended to replace advice given to you by your health care provider. Make sure you discuss any questions you have with your health care provider.

## 2014-08-23 ENCOUNTER — Ambulatory Visit: Payer: BC Managed Care – PPO | Admitting: Internal Medicine

## 2014-09-04 ENCOUNTER — Ambulatory Visit: Payer: BC Managed Care – PPO | Admitting: Internal Medicine

## 2014-09-06 ENCOUNTER — Other Ambulatory Visit (INDEPENDENT_AMBULATORY_CARE_PROVIDER_SITE_OTHER): Payer: BC Managed Care – PPO

## 2014-09-06 ENCOUNTER — Ambulatory Visit (INDEPENDENT_AMBULATORY_CARE_PROVIDER_SITE_OTHER): Payer: BC Managed Care – PPO | Admitting: Internal Medicine

## 2014-09-06 ENCOUNTER — Encounter: Payer: Self-pay | Admitting: Internal Medicine

## 2014-09-06 VITALS — BP 118/76 | HR 77 | Temp 97.7°F | Ht 68.0 in | Wt 247.0 lb

## 2014-09-06 DIAGNOSIS — Z0000 Encounter for general adult medical examination without abnormal findings: Secondary | ICD-10-CM

## 2014-09-06 DIAGNOSIS — Z0189 Encounter for other specified special examinations: Secondary | ICD-10-CM

## 2014-09-06 DIAGNOSIS — E119 Type 2 diabetes mellitus without complications: Secondary | ICD-10-CM

## 2014-09-06 DIAGNOSIS — I10 Essential (primary) hypertension: Secondary | ICD-10-CM

## 2014-09-06 DIAGNOSIS — E785 Hyperlipidemia, unspecified: Secondary | ICD-10-CM

## 2014-09-06 LAB — LIPID PANEL
Cholesterol: 128 mg/dL (ref 0–200)
HDL: 53.5 mg/dL (ref >39.00)
LDL CALC: 53 mg/dL (ref 0–99)
NONHDL: 74.5
Total CHOL/HDL Ratio: 2
Triglycerides: 107 mg/dL (ref 0.0–149.0)
VLDL: 21.4 mg/dL (ref 0.0–40.0)

## 2014-09-06 LAB — HEPATIC FUNCTION PANEL
ALBUMIN: 4.4 g/dL (ref 3.5–5.2)
ALT: 10 U/L (ref 0–35)
AST: 16 U/L (ref 0–37)
Alkaline Phosphatase: 86 U/L (ref 39–117)
BILIRUBIN TOTAL: 0.3 mg/dL (ref 0.2–1.2)
Bilirubin, Direct: 0 mg/dL (ref 0.0–0.3)
Total Protein: 7.9 g/dL (ref 6.0–8.3)

## 2014-09-06 LAB — BASIC METABOLIC PANEL
BUN: 14 mg/dL (ref 6–23)
CO2: 31 mEq/L (ref 19–32)
Calcium: 9.7 mg/dL (ref 8.4–10.5)
Chloride: 104 mEq/L (ref 96–112)
Creatinine, Ser: 1.26 mg/dL — ABNORMAL HIGH (ref 0.40–1.20)
GFR: 57.37 mL/min — ABNORMAL LOW (ref >60.00)
GLUCOSE: 82 mg/dL (ref 70–99)
Potassium: 4.1 mEq/L (ref 3.5–5.1)
Sodium: 139 mEq/L (ref 135–145)

## 2014-09-06 LAB — HEMOGLOBIN A1C: Hgb A1c MFr Bld: 6.7 % — ABNORMAL HIGH (ref 4.6–6.5)

## 2014-09-06 NOTE — Progress Notes (Signed)
Subjective:    Patient ID: Jessica Daniels, female    DOB: 1960/08/18, 54 y.o.   MRN: [id number]  HPI  Here to f/u; overall doing ok,  Pt denies chest pain, increased sob or doe, wheezing, orthopnea, PND, increased LE swelling, palpitations, dizziness or syncope.  Pt denies polydipsia, polyuria, or low sugar symptoms such as weakness or confusion improved with po intake.  Pt denies new neurological symptoms such as new headache, or facial or extremity weakness or numbness.   Pt states overall good compliance with meds, has been trying to follow lower cholesterol, diabetic diet, with wt increased several lbs,  but little exercise however. Past Medical History  Diagnosis Date  . Hypertension   . Hyperlipidemia   . Anxiety   . Depression   . Anemia   . Diabetes mellitus   . Allergic rhinitis    Past Surgical History  Procedure Laterality Date  . Cholecystectomy    . Lumbar laminectomy      reports that she has never smoked. She does not have any smokeless tobacco history on file. She reports that she does not drink alcohol or use illicit drugs. family history includes Diabetes in an other family member. Allergies  Allergen Reactions  . Crestor [Rosuvastatin]     Elevated liver enzymes   Current Outpatient Prescriptions on File Prior to Visit  Medication Sig Dispense Refill  . amLODipine (NORVASC) 10 MG tablet Take 1 tablet (10 mg total) by mouth daily. 30 tablet 11  . atorvastatin (LIPITOR) 20 MG tablet TAKE 1 TABLET BY MOUTH DAILY 90 tablet 1  . benazepril (LOTENSIN) 40 MG tablet Take 1 tablet (40 mg total) by mouth daily. 30 tablet 11  . Blood Glucose Monitoring Suppl (ONE TOUCH ULTRA 2) W/DEVICE KIT Use as directed , 1 device  250.02 1 each 0  . glucose blood test strip Use as instructed 100 each 12  . ibuprofen (ADVIL,MOTRIN) 800 MG tablet Take 1 tablet (800 mg total) by mouth 3 (three) times daily. 21 tablet 0  . Lancets MISC Use as directed 1 per day   250.02 100 each 12  .  metFORMIN (GLUCOPHAGE) 1000 MG tablet Take 1 tablet (1,000 mg total) by mouth daily with breakfast. 30 tablet 11  . methocarbamol (ROBAXIN) 500 MG tablet Take 1 tablet (500 mg total) by mouth 2 (two) times daily. 20 tablet 0  . pioglitazone (ACTOS) 45 MG tablet Take 1 tablet (45 mg total) by mouth daily. 90 tablet 3  . sitaGLIPtin (JANUVIA) 100 MG tablet Take 1 tablet (100 mg total) by mouth daily. 90 tablet 3  . traMADol (ULTRAM) 50 MG tablet Take 1 tablet (50 mg total) by mouth every 6 (six) hours as needed. 15 tablet 0   No current facility-administered medications on file prior to visit.   Review of Systems  Constitutional: Negative for unusual diaphoresis or other sweats  HENT: Negative for ringing in ear Eyes: Negative for double vision or worsening visual disturbance.  Respiratory: Negative for choking and stridor.   Gastrointestinal: Negative for vomiting or other signifcant bowel change Genitourinary: Negative for hematuria or decreased urine volume.  Musculoskeletal: Negative for other MSK pain or swelling Skin: Negative for color change and worsening wound.  Neurological: Negative for tremors and numbness other than noted  Psychiatric/Behavioral: Negative for decreased concentration or agitation other than above  '    Objective:   Physical Exam BP 118/76 mmHg  Pulse 77  Temp(Src) 97.7 F (36.5 C) (Oral)  Ht 5' 8" (1.727 m)  Wt 247 lb (112.038 kg)  BMI 37.56 kg/m2  SpO2 99% VS noted,  Constitutional: Pt appears well-developed, well-nourished.  HENT: Head: NCAT.  Right Ear: External ear normal.  Left Ear: External ear normal.  Eyes: . Pupils are equal, round, and reactive to light. Conjunctivae and EOM are normal Neck: Normal range of motion. Neck supple.  Cardiovascular: Normal rate and regular rhythm.   Pulmonary/Chest: Effort normal and breath sounds without rales or wheezing.  Abd:  Soft, NT, ND, + BS Neurological: Pt is alert. Not confused , motor grossly  intact Skin: Skin is warm. No rash Psychiatric: Pt behavior is normal. No agitation.  Wt Readings from Last 3 Encounters:  09/06/14 247 lb (112.038 kg)  06/04/14 235 lb (106.595 kg)  02/20/14 234 lb 8 oz (106.369 kg)       Assessment & Plan:

## 2014-09-06 NOTE — Patient Instructions (Signed)

## 2014-09-06 NOTE — Progress Notes (Signed)
Pre visit review using our clinic review tool, if applicable. No additional management support is needed unless otherwise documented below in the visit note. 

## 2014-09-06 NOTE — Assessment & Plan Note (Signed)
stable overall by history and exam, recent data reviewed with pt, and pt to continue medical treatment as before,  to f/u any worsening symptoms or concerns BP Readings from Last 3 Encounters:  09/06/14 118/76  08/05/14 154/88  06/04/14 132/87

## 2014-09-06 NOTE — Assessment & Plan Note (Signed)
Improved on new actos, stable overall by history and exam, recent data reviewed with pt, and pt to continue medical treatment as before,  to f/u any worsening symptoms or concerns Lab Results  Component Value Date   HGBA1C 8.0* 06/04/2014   For f/u a1c

## 2014-09-06 NOTE — Assessment & Plan Note (Signed)
stable overall by history and exam, recent data reviewed with pt, and pt to continue medical treatment as before,  to f/u any worsening symptoms or concerns Lab Results  Component Value Date   LDLCALC 69 06/04/2014    

## 2014-10-24 ENCOUNTER — Encounter: Payer: BC Managed Care – PPO | Admitting: Sports Medicine

## 2014-10-30 ENCOUNTER — Ambulatory Visit (INDEPENDENT_AMBULATORY_CARE_PROVIDER_SITE_OTHER): Payer: BC Managed Care – PPO

## 2014-10-30 ENCOUNTER — Encounter: Payer: Self-pay | Admitting: Sports Medicine

## 2014-10-30 ENCOUNTER — Other Ambulatory Visit: Payer: Self-pay | Admitting: Sports Medicine

## 2014-10-30 ENCOUNTER — Ambulatory Visit (INDEPENDENT_AMBULATORY_CARE_PROVIDER_SITE_OTHER): Payer: BC Managed Care – PPO | Admitting: Sports Medicine

## 2014-10-30 VITALS — BP 152/91 | HR 93 | Ht 67.0 in | Wt 249.0 lb

## 2014-10-30 DIAGNOSIS — M769 Unspecified enthesopathy, lower limb, excluding foot: Secondary | ICD-10-CM | POA: Diagnosis not present

## 2014-10-30 DIAGNOSIS — M25462 Effusion, left knee: Secondary | ICD-10-CM | POA: Diagnosis not present

## 2014-10-30 DIAGNOSIS — M545 Low back pain: Secondary | ICD-10-CM | POA: Diagnosis not present

## 2014-10-30 DIAGNOSIS — M1712 Unilateral primary osteoarthritis, left knee: Secondary | ICD-10-CM | POA: Insufficient documentation

## 2014-10-30 DIAGNOSIS — M5136 Other intervertebral disc degeneration, lumbar region: Secondary | ICD-10-CM

## 2014-10-30 MED ORDER — PREDNISONE 50 MG PO TABS: ORAL_TABLET | ORAL | Status: DC

## 2014-10-30 MED ORDER — MELOXICAM 15 MG PO TABS: ORAL_TABLET | ORAL | Status: DC

## 2014-10-30 MED ORDER — CYCLOBENZAPRINE HCL 10 MG PO TABS: ORAL_TABLET | ORAL | Status: DC

## 2014-10-30 NOTE — Assessment & Plan Note (Signed)
Likely represents osteoarthritis with no meniscal signs.  Aspiration and injection as above, x-rays.  Return in one month.
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
## 2014-11-11 ENCOUNTER — Ambulatory Visit: Payer: BC Managed Care – PPO | Attending: Sports Medicine

## 2014-11-11 DIAGNOSIS — M545 Low back pain, unspecified: Secondary | ICD-10-CM

## 2014-11-11 DIAGNOSIS — M6281 Muscle weakness (generalized): Secondary | ICD-10-CM | POA: Insufficient documentation

## 2014-11-11 DIAGNOSIS — M62830 Muscle spasm of back: Secondary | ICD-10-CM

## 2014-11-11 DIAGNOSIS — R293 Abnormal posture: Secondary | ICD-10-CM | POA: Diagnosis not present
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
## 2014-11-11 NOTE — Patient Instructions (Signed)
Issued info on multifidus and lower abdominals, pelvic floor handout from cabinet.

## 2014-11-20 ENCOUNTER — Other Ambulatory Visit: Payer: Self-pay | Admitting: Internal Medicine

## 2014-11-20 ENCOUNTER — Ambulatory Visit: Payer: BC Managed Care – PPO

## 2014-11-20 DIAGNOSIS — M545 Low back pain, unspecified: Secondary | ICD-10-CM

## 2014-11-20 DIAGNOSIS — M6281 Muscle weakness (generalized): Secondary | ICD-10-CM

## 2014-11-20 NOTE — Therapy (Signed)
[facility] [address] [place], [place], [address] Phone: [phone]   Fax:  [phone]  Physical Therapy Treatment  Patient Details  Name: Jessica Daniels MRN: [id number] Date of Birth: 06/06/1961 Referring Provider:  Corwin LevinsJohn, James W, MD  Encounter Date: 11/20/2014      PT End of Session - 11/20/14 0806    Visit Number 2   Number of Visits 12   Date for PT Re-Evaluation 12/23/14   PT Start Time 0705   PT Stop Time 0748   PT Time Calculation (min) 43 min   Activity Tolerance Patient tolerated treatment well   Behavior During Therapy [facility] for tasks assessed/performed      Past Medical History  Diagnosis Date  . Hypertension   . Hyperlipidemia   . Anxiety   . Depression   . Anemia   . Diabetes mellitus   . Allergic rhinitis     Past Surgical History  Procedure Laterality Date  . Cholecystectomy    . Lumbar laminectomy      There were no vitals filed for this visit.  Visit Diagnosis:  Bilateral low back pain without sciatica  Weakness of trunk musculature      Subjective Assessment - 11/20/14 0719    Subjective Better . Think it needed time. Pain 1/10   Currently in Pain? Yes   Pain Score 1    Pain Location Back   Pain Orientation Lower;Left;Right   Pain Type Chronic pain   Pain Onset More than a month ago   Pain Frequency Intermittent   Aggravating Factors  lifting , standing sitting , bending   Pain Relieving Factors heat meds   Multiple Pain Sites No                         [facility] Adult PT Treatment/Exercise - 11/20/14 0720    Lumbar Exercises: Stretches   Passive Hamstring Stretch 2 reps;30 seconds  RT and Lt   Single Knee to Chest Stretch 2 reps;30 seconds  RT and LT   Lower Trunk Rotation 2 reps;30 seconds  RT  and LT   Pelvic Tilt --  10 reps 5 sec with tactile and verbal cues     Piriformis Stretch 2 reps;30 seconds   RT and LT    Lumbar Exercises: Aerobic    Stationary Bike Nustep L5 6 min legs only   Lumbar Exercises: Supine   Ab Set 10 reps;5 seconds   Other Supine Lumbar Exercises Hip ER stretch 30 sec x2 RT and LT    Modalities   Modalities Ultrasound   Ultrasound   Ultrasound Location lower lumbar    Ultrasound Parameters 100% 1MHz, 1.7 Wcm2   Ultrasound Goals Pain   Iontophoresis   Type of Iontophoresis Dexamethasone   Location LT SI area and Rt as she indicated bilateral pain   Dose 1cc   Time 4 hours                  PT Short Term Goals - 11/11/14 [id number]    PT SHORT TERM GOAL #1   Title She will be independent with inital HEP   Time 3   Period Weeks   Status New   PT SHORT TERM GOAL #2   Title She will report pain improved 30% or more   Time 3   Period Weeks   Status New           PT Long  Term Goals - 11/11/14 0939    PT LONG TERM GOAL #1   Title she will be independent with all HEP issued as of last visit.    Time 6   Period Weeks   Status New   PT LONG TERM GOAL #2   Title She will report pain decreased 75% or more and tolerance to prolonged sitting and standing improved.    Time 6   Period Weeks   Status New               Plan - 11/20/14 [id number]    Clinical Impression Statement she declined heat as she reported feeling good. Start HEP with abdominal control and stretching   PT Next Visit Plan ionto, stretching and HEP, with pelvic tilt and tranvers abdominus, ionto, US   Consulted and Agree with Plan of Care Patient        Problem List Patient Active Problem List   Diagnosis Date Noted  . Swelling of left knee joint 10/30/2014  . Diabetes 06/04/2014  . Pain in both feet 02/20/2014  . Headache(784.0) 11/01/2013  . Low back pain 01/30/2013  . Preventative health care 01/22/2011  . Palpitations 04/02/2008  . Hyperlipidemia 07/27/2007  . ANEMIA-NOS 04/12/2007  . ANXIETY 04/12/2007  . DEPRESSION 04/12/2007  . ALLERGIC RHINITIS 04/12/2007  . Essential hypertension 02/22/2007     Caprice RedChasse, Yurani Fettes M PT 11/20/2014, 8:09 AM  [facility] [address] [place], [place], [address] Phone: [phone]   Fax:  [phone]

## 2014-11-20 NOTE — Patient Instructions (Signed)
Remove patch in 4 hours or sooner if irritating

## 2014-11-21 ENCOUNTER — Ambulatory Visit: Payer: BC Managed Care – PPO | Admitting: Physical Therapy

## 2014-11-21 DIAGNOSIS — M545 Low back pain, unspecified: Secondary | ICD-10-CM

## 2014-11-21 DIAGNOSIS — M62830 Muscle spasm of back: Secondary | ICD-10-CM

## 2014-11-21 DIAGNOSIS — R293 Abnormal posture: Secondary | ICD-10-CM

## 2014-11-21 DIAGNOSIS — M6281 Muscle weakness (generalized): Secondary | ICD-10-CM

## 2014-11-21 NOTE — Therapy (Signed)
[facility] [address] [place], [place], [address] Phone: [phone]   Fax:  [phone]  Physical Therapy Treatment  Patient Details  Name: Jessica Daniels D Leak-Burns MRN: [id number] Date of Birth: 04/24/1961 Referring Provider:  Corwin LevinsJohn, James W, MD  Encounter Date: 11/21/2014      PT End of Session - 11/21/14 0744    Visit Number 3   Number of Visits 12   Date for PT Re-Evaluation 12/23/14   PT Start Time 0735   PT Stop Time 0800   PT Time Calculation (min) 25 min      Past Medical History  Diagnosis Date  . Hypertension   . Hyperlipidemia   . Anxiety   . Depression   . Anemia   . Diabetes mellitus   . Allergic rhinitis     Past Surgical History  Procedure Laterality Date  . Cholecystectomy    . Lumbar laminectomy      There were no vitals filed for this visit.  Visit Diagnosis:  Bilateral low back pain without sciatica  Weakness of trunk musculature  Abnormal posture  Muscle spasm of back      Subjective Assessment - 11/21/14 0742    Subjective A little stiffness. Al ittle pain after last treatment, not much. I am unsure if the ionto patch has helped. I am still taking pain pills.    Currently in Pain? Yes   Pain Score 3    Pain Location Back   Pain Descriptors / Indicators --  stiff   Pain Type Chronic pain                         [facility] Adult PT Treatment/Exercise - 11/21/14 0739    Lumbar Exercises: Stretches   Passive Hamstring Stretch 3 reps;30 seconds  RT and Lt   Single Knee to Chest Stretch 3 reps;30 seconds  RT and LT   Lower Trunk Rotation 3 reps;30 seconds   Piriformis Stretch 3 reps;30 seconds   RT and LT    Lumbar Exercises: Supine   Ab Set 10 reps;5 seconds   Clam 10 reps   Heel Slides 10 reps   Bent Knee Raise 10 reps   Iontophoresis   Type of Iontophoresis Dexamethasone   Location LT SI area   Dose 1cc   Time 4 hours                PT Education -  11/21/14 0837    Education provided Yes   Education Details knee to chest, trunk rotation, hamstring stretch, supine prepilates series   Person(s) Educated Patient   Methods Explanation;Handout   Comprehension Verbalized understanding          PT Short Term Goals - 11/21/14 0742    PT SHORT TERM GOAL #1   Title She will be independent with inital HEP   Time 3   Period Weeks   Status On-going   PT SHORT TERM GOAL #2   Title She will report pain improved 30% or more   Time 3   Period Weeks   Status On-going           PT Long Term Goals - 11/21/14 [id number]    PT LONG TERM GOAL #1   Title she will be independent with all HEP issued as of last visit.    Time 6   Period Weeks   Status New   PT LONG TERM GOAL #2  Title She will report pain decreased 75% or more and tolerance to prolonged sitting and standing improved.    Time 6   Period Weeks   Status On-going               Plan - 11/21/14 0749    Clinical Impression Statement Pt instructed in stretching HEP and Transverse abdominal series. She required verbal and tactile cues to maintain neutral spine and Transverse engagement.HEP issued to patient.  She has not been performing any exercises at home and notes no real improvement to date likely due to limited visits so far. Will continue with stretching and core strength per PT POC.    PT Next Visit Plan ionto, stretching and HEP, with pelvic tilt and tranvers abdominus, ionto, US        Problem List Patient Active Problem List   Diagnosis Date Noted  . Swelling of left knee joint 10/30/2014  . Diabetes 06/04/2014  . Pain in both feet 02/20/2014  . Headache(784.0) 11/01/2013  . Low back pain 01/30/2013  . Preventative health care 01/22/2011  . Palpitations 04/02/2008  . Hyperlipidemia 07/27/2007  . ANEMIA-NOS 04/12/2007  . ANXIETY 04/12/2007  . DEPRESSION 04/12/2007  . ALLERGIC RHINITIS 04/12/2007  . Essential hypertension 02/22/2007    Sherrie Mustacheonoho, Jessica  McGee, PTA 11/21/2014, 8:41 AM  [facility] [address] [place], [place], [address] Phone: [phone]   Fax:  [phone]

## 2014-11-21 NOTE — Patient Instructions (Addendum)
Knee to Chest   Lying supine, bend involved knee to chest ___ times. Repeat with other leg. Do ___ times per day.     http://orth.exer.us/676   Copyright  VHI. All rights reserved.  Lower Trunk Rotation Stretch   Keeping back flat and feet together, rotate knees to left side. Hold ___30_ seconds. Repeat __3_ times per set. . Do __2__ sessions per day.   Hamstring Stretch   With other leg bent, foot flat, grasp right leg and slowly try to straighten knee. Hold _30___ seconds. Repeat _3___ times each leg. Do __2__ sessions per day.  http://gt2.exer.us/280   Copyright  VHI. All rights reserved.    PELVIC TILT  Lie on back, legs bent. Exhale, tilting top of pelvis back, pubic bone up, to flatten lower back. Inhale, rolling pelvis opposite way, top forward, pubic bone down, arch in back. Repeat __10__ times. Do __2__ sessions per day. Copyright  VHI. All rights reserved.    Isometric Hold With Pelvic Floor (Hook-Lying)  Lie with hips and knees bent. Slowly inhale, and then exhale. Pull navel toward spine and tighten pelvic floor. Hold for __10_ seconds. Continue to breathe in and out during hold. Rest for _10__ seconds. Repeat __10_ times. Do __2-3_ times a day.   Knee Fold  Lie on back, legs bent, arms by sides. Exhale, lifting knee to chest. Inhale, returning. Keep abdominals flat, navel to spine. Repeat __10__ times, alternating legs. Do __2__ sessions per day.  Knee Drop  Keep pelvis stable. Without rotating hips, slowly drop knee to side, pause, return to center, bring knee across midline toward opposite hip. Feel obliques engaging. Repeat for ___10_ times each leg.   Copyright  VHI. All rights reserved.       Heel Slide to Straight   Slide one leg down to straight. Return. Be sure pelvis does not rock forward, tilt, rotate, or tip to side. Do _10__ times. Restabilize pelvis. Repeat with other leg. Do __1-2_ sets, __2_ times per  day.  http://ss.exer.us/16   Copyright  VHI. All rights reserved.

## 2014-11-25 ENCOUNTER — Ambulatory Visit: Payer: BC Managed Care – PPO

## 2014-11-25 DIAGNOSIS — M545 Low back pain, unspecified: Secondary | ICD-10-CM

## 2014-11-25 DIAGNOSIS — M25659 Stiffness of unspecified hip, not elsewhere classified: Secondary | ICD-10-CM

## 2014-11-25 DIAGNOSIS — M6281 Muscle weakness (generalized): Secondary | ICD-10-CM

## 2014-11-25 NOTE — Therapy (Signed)
[facility] [address] [place], [place], [address] Phone: [phone]   Fax:  [phone]  Physical Therapy Treatment  Patient Details  Name: Jessica Daniels MRN: [id number] Date of Birth: 11-22-1960 Referring Provider:  Corwin Levins, MD  Encounter Date: 11/25/2014      PT End of Session - 11/25/14 1455    Visit Number 4   Number of Visits 12   Date for PT Re-Evaluation 12/23/14   PT Start Time 0220   PT Stop Time 0252   PT Time Calculation (min) 32 min   Activity Tolerance Patient tolerated treatment well   Behavior During Therapy [facility] for tasks assessed/performed      Past Medical History  Diagnosis Date  . Hypertension   . Hyperlipidemia   . Anxiety   . Depression   . Anemia   . Diabetes mellitus   . Allergic rhinitis     Past Surgical History  Procedure Laterality Date  . Cholecystectomy    . Lumbar laminectomy      There were no vitals filed for this visit.  Visit Diagnosis:  Bilateral low back pain without sciatica  Weakness of trunk musculature  Stiffness of hip joint, unspecified laterality      Subjective Assessment - 11/25/14 1419    Subjective Doing ok. A little stiffness today   Currently in Pain? No/denies   Multiple Pain Sites No                         [facility] Adult PT Treatment/Exercise - 11/25/14 1424    Lumbar Exercises: Aerobic   Stationary Bike Nustep L5 6 min legs and arms   Lumbar Exercises: Supine   Ab Set 10 reps  10 sec hold   Bridge 15 reps;3 seconds  with ball squeeze   Lumbar Exercises: Sidelying   Clam 15 reps   Clam Limitations cue for pelvic alignment   Hip Abduction 15 reps   Hip Abduction Weights (lbs) cues for pelvic alignment   Manual Therapy   Manual therapy comments 8 min with SLR , low trunk rotation and ITB stretch RT and LT with sistaoined stretch                  PT Short Term Goals - 11/21/14 4132    PT SHORT TERM GOAL  #1   Title She will be independent with inital HEP   Time 3   Period Weeks   Status On-going   PT SHORT TERM GOAL #2   Title She will report pain improved 30% or more   Time 3   Period Weeks   Status On-going           PT Long Term Goals - 11/21/14 4401    PT LONG TERM GOAL #1   Title she will be independent with all HEP issued as of last visit.    Time 6   Period Weeks   Status New   PT LONG TERM GOAL #2   Title She will report pain decreased 75% or more and tolerance to prolonged sitting and standing improved.    Time 6   Period Weeks   Status On-going               Plan - 11/25/14 1456    Clinical Impression Statement She reported no pain and felt much looser/no tightness. Discharge Ionto as not much benefit   PT Next Visit Plan Continue  stretching Hortencia Pilar/manual and core stability   Consulted and Agree with Plan of Care Patient        Problem List Patient Active Problem List   Diagnosis Date Noted  . Swelling of left knee joint 10/30/2014  . Diabetes 06/04/2014  . Pain in both feet 02/20/2014  . Headache(784.0) 11/01/2013  . Low back pain 01/30/2013  . Preventative health care 01/22/2011  . Palpitations 04/02/2008  . Hyperlipidemia 07/27/2007  . ANEMIA-NOS 04/12/2007  . ANXIETY 04/12/2007  . DEPRESSION 04/12/2007  . ALLERGIC RHINITIS 04/12/2007  . Essential hypertension 02/22/2007    Caprice RedChasse, Lodema Parma M PT 11/25/2014, 2:59 PM  [facility] [address] [place], [place], [address] Phone: [phone]   Fax:  [phone]

## 2014-11-27 ENCOUNTER — Ambulatory Visit (INDEPENDENT_AMBULATORY_CARE_PROVIDER_SITE_OTHER): Payer: BC Managed Care – PPO | Admitting: Sports Medicine

## 2014-11-27 ENCOUNTER — Ambulatory Visit: Payer: BC Managed Care – PPO

## 2014-11-27 ENCOUNTER — Encounter: Payer: Self-pay | Admitting: Sports Medicine

## 2014-11-27 VITALS — BP 148/87 | HR 80 | Ht 67.0 in | Wt 244.0 lb

## 2014-11-27 DIAGNOSIS — M25462 Effusion, left knee: Secondary | ICD-10-CM

## 2014-11-27 DIAGNOSIS — M545 Low back pain, unspecified: Secondary | ICD-10-CM

## 2014-11-27 DIAGNOSIS — M6281 Muscle weakness (generalized): Secondary | ICD-10-CM

## 2014-11-27 DIAGNOSIS — R293 Abnormal posture: Secondary | ICD-10-CM

## 2014-11-27 DIAGNOSIS — M5440 Lumbago with sciatica, unspecified side: Secondary | ICD-10-CM

## 2014-11-27 NOTE — Assessment & Plan Note (Signed)
Likely related to disc and facet pathology, spondylosis, post discectomy 1 for right-sided radiculopathy with complete resolution of axial and radicular pain after surgery. Pain is now completely resolved with physical therapy, prednisone and NSAIDs.

## 2014-11-27 NOTE — Therapy (Signed)
[facility] [address] [place], [place], [address] Phone: [phone]   Fax:  [phone]  Physical Therapy Treatment  Patient Details  Name: Jessica Daniels MRN: [id number] Date of Birth: 11-11-1960 Referring Provider:  Corwin Levins, MD  Encounter Date: 11/27/2014      PT End of Session - 11/27/14 1139    Visit Number 5   Number of Visits 12   Date for PT Re-Evaluation 12/23/14   PT Start Time 1100   PT Stop Time 1132   PT Time Calculation (min) 32 min   Activity Tolerance Patient tolerated treatment well   Behavior During Therapy [facility] for tasks assessed/performed      Past Medical History  Diagnosis Date  . Hypertension   . Hyperlipidemia   . Anxiety   . Depression   . Anemia   . Diabetes mellitus   . Allergic rhinitis     Past Surgical History  Procedure Laterality Date  . Cholecystectomy    . Lumbar laminectomy      There were no vitals filed for this visit.  Visit Diagnosis:  Bilateral low back pain without sciatica  Weakness of trunk musculature  Abnormal posture      Subjective Assessment - 11/27/14 1108    Subjective No pain , doing well today   Currently in Pain? No/denies   Multiple Pain Sites No                         [facility] Adult PT Treatment/Exercise - 11/27/14 1109    Lumbar Exercises: Aerobic   Stationary Bike Nustep L7 6 min legs and arms   Lumbar Exercises: Supine   Ab Set 10 reps   AB Set Limitations 5 sec hold   Clam 15 reps   Clam Limitations green band with pelvic tilt   Bent Knee Raise 10 reps   Bent Knee Raise Limitations cued to keep abdomen tight   Bridge 15 reps;3 seconds   Bridge Limitations with ball squeeze and post pelvic tilt   Lumbar Exercises: Sidelying   Clam 10 reps  with green band and pelvic tilt   Hip Abduction 15 reps   Hip Abduction Weights (lbs) she held pelvis in good spot today   Manual Therapy   Manual therapy comments 6 min  with SLR , low trunk rotation and ITB stretch RT and LT with sistaoined stretch                  PT Short Term Goals - 11/27/14 1140    PT SHORT TERM GOAL #1   Title She will be independent with inital HEP   Status On-going   PT SHORT TERM GOAL #2   Title She will report pain improved 30% or more   Status Achieved           PT Long Term Goals - 11/21/14 0742    PT LONG TERM GOAL #1   Title she will be independent with all HEP issued as of last visit.    Time 6   Period Weeks   Status New   PT LONG TERM GOAL #2   Title She will report pain decreased 75% or more and tolerance to prolonged sitting and standing improved.    Time 6   Period Weeks   Status On-going               Plan - 11/27/14 1139  Clinical Impression Statement No pain at end. We need to progress HEP with stab Exercise . Will review HEP and add as able   PT Next Visit Plan Review HEP and add as able   PT Home Exercise Plan core stab   Consulted and Agree with Plan of Care Patient        Problem List Patient Active Problem List   Diagnosis Date Noted  . Swelling of left knee joint 10/30/2014  . Diabetes 06/04/2014  . Pain in both feet 02/20/2014  . Headache(784.0) 11/01/2013  . Low back pain 01/30/2013  . Preventative health care 01/22/2011  . Palpitations 04/02/2008  . Hyperlipidemia 07/27/2007  . ANEMIA-NOS 04/12/2007  . ANXIETY 04/12/2007  . DEPRESSION 04/12/2007  . ALLERGIC RHINITIS 04/12/2007  . Essential hypertension 02/22/2007    Caprice RedChasse, Laquasia Pincus M PT 11/27/2014, 11:42 AM  [facility] [address] [place], [place], [address] Phone: [phone]   Fax:  [phone]

## 2014-11-27 NOTE — Assessment & Plan Note (Signed)
Completely resolved after aspiration and injection 
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
## 2014-12-04 ENCOUNTER — Ambulatory Visit: Payer: BC Managed Care – PPO | Attending: Sports Medicine

## 2014-12-04 DIAGNOSIS — M545 Low back pain, unspecified: Secondary | ICD-10-CM

## 2014-12-04 DIAGNOSIS — M6281 Muscle weakness (generalized): Secondary | ICD-10-CM | POA: Diagnosis present

## 2014-12-04 DIAGNOSIS — R293 Abnormal posture: Secondary | ICD-10-CM | POA: Insufficient documentation

## 2014-12-04 NOTE — Patient Instructions (Addendum)
   PELVIC TILT  Lie on back, legs bent. Exhale, tilting top of pelvis back, pubic bone up, to flatten lower back. Inhale, rolling pelvis opposite way, top forward, pubic bone down, arch in back. Repeat __10__ times. Do __2__ sessions per day. Copyright  VHI. All rights reserved.    Isometric Hold With Pelvic Floor (Hook-Lying)  Lie with hips and knees bent. Slowly inhale, and then exhale. Pull navel toward spine and tighten pelvic floor. Hold for __10_ seconds. Continue to breathe in and out during hold. Rest for _10__ seconds. Repeat __10_ times. Do __2-3_ times a day.   Knee Fold  Lie on back, legs bent, arms by sides. Exhale, lifting knee to chest. Inhale, returning. Keep abdominals flat, navel to spine. Repeat __10__ times, alternating legs. Do __2__ sessions per day.  Knee Drop  Keep pelvis stable. Without rotating hips, slowly drop knee to side, pause, return to center, bring knee across midline toward opposite hip. Feel obliques engaging. Repeat for ___10_ times each leg.   Copyright  VHI. All rights reserved.       Heel Slide to Straight   Slide one leg down to straight. Return. Be sure pelvis does not rock forward, tilt, rotate, or tip to side. Do _10__ times. Restabilize pelvis. Repeat with other leg. Do __1-2_ sets, __2_ times per day.  http://ss.exer.us/16   Copyright  VHI. All rights reserved.  Added Leg pull in prone prep  And clams with feet lifted in side lying from [employer] Pilates book 2-5x/week  10-12 reps hold 1-3 reps RT and LT

## 2014-12-04 NOTE — Therapy (Signed)
[facility] [address] [place], [place], [address] Phone: [phone]   Fax:  [phone]  Physical Therapy Treatment  Patient Details  Name: Jessica Daniels MRN: [id number] Date of Birth: 11/15/1960 Referring Provider:  Monica Becton,*  Encounter Date: 12/04/2014      PT End of Session - 12/04/14 0818    Visit Number 5   Number of Visits 12   Date for PT Re-Evaluation 12/23/14   PT Start Time 0807   PT Stop Time 0847   PT Time Calculation (min) 40 min   Activity Tolerance Patient tolerated treatment well   Behavior During Therapy [facility] for tasks assessed/performed      Past Medical History  Diagnosis Date  . Hypertension   . Hyperlipidemia   . Anxiety   . Depression   . Anemia   . Diabetes mellitus   . Allergic rhinitis     Past Surgical History  Procedure Laterality Date  . Cholecystectomy    . Lumbar laminectomy      There were no vitals filed for this visit.  Visit Diagnosis:  Bilateral low back pain without sciatica  Weakness of trunk musculature      Subjective Assessment - 12/04/14 0809    Subjective Tired but doing well. I do the Transverse setting in all positions during day.    Currently in Pain? No/denies                         [facility] Adult PT Treatment/Exercise - 12/04/14 0809    Lumbar Exercises: Aerobic   Stationary Bike Nustep L5 6 min legs and arms   Lumbar Exercises: Supine   Other Supine Lumbar Exercises PRe pilates exercisde for home     HEP clams and quadraped            PT Education - 12/04/14 0815    Education provided Yes   Education Details pre pilates   Person(s) Educated Patient   Methods Explanation;Tactile cues;Handout;Verbal cues   Comprehension Returned demonstration          PT Short Term Goals - 12/04/14 0826    PT SHORT TERM GOAL #1   Title She will be independent with inital HEP   Status Achieved   PT SHORT TERM GOAL #2    Title She will report pain improved 30% or more   Status Achieved           PT Long Term Goals - 12/04/14 0826    PT LONG TERM GOAL #1   Title she will be independent with all HEP issued as of last visit.    Status On-going   PT LONG TERM GOAL #2   Title She will report pain decreased 75% or more and tolerance to prolonged sitting and standing improved.    Status On-going               Plan - 12/04/14 3474    Clinical Impression Statement She continues to do well with decreased pain and good abdominal contro; She appears to be doing HEP regularly. We will progreess   PT Next Visit Plan Review Pre pilates exercises and progress as able   PT Home Exercise Plan pre pilates exercise   Consulted and Agree with Plan of Care Patient        Problem List Patient Active Problem List   Diagnosis Date Noted  . Swelling of left knee joint 10/30/2014  . Diabetes  06/04/2014  . Pain in both feet 02/20/2014  . Headache(784.0) 11/01/2013  . Low back pain 01/30/2013  . Preventative health care 01/22/2011  . Palpitations 04/02/2008  . Hyperlipidemia 07/27/2007  . ANEMIA-NOS 04/12/2007  . ANXIETY 04/12/2007  . DEPRESSION 04/12/2007  . ALLERGIC RHINITIS 04/12/2007  . Essential hypertension 02/22/2007    Caprice RedChasse, Pierce Barocio M PT 12/04/2014, 8:49 AM  [facility] [address] [place], [place], [address] Phone: [phone]   Fax:  [phone]

## 2014-12-05 ENCOUNTER — Ambulatory Visit: Payer: BC Managed Care – PPO | Admitting: Physical Therapy

## 2014-12-05 DIAGNOSIS — M6281 Muscle weakness (generalized): Secondary | ICD-10-CM

## 2014-12-05 DIAGNOSIS — M545 Low back pain: Secondary | ICD-10-CM | POA: Diagnosis not present

## 2014-12-05 DIAGNOSIS — R293 Abnormal posture: Secondary | ICD-10-CM

## 2014-12-05 DIAGNOSIS — M67432 Ganglion, left wrist: Secondary | ICD-10-CM | POA: Insufficient documentation

## 2014-12-05 NOTE — Patient Instructions (Signed)

## 2014-12-05 NOTE — Therapy (Signed)
[place], [place], [address] Phone: [phone]   Fax:  [phone]  Physical Therapy Treatment  Patient Details  Name: Jessica Daniels MRN: [id number] Date of Birth: August 07, 1960 Referring Provider:  Biagio Borg, MD  Encounter Date: 12/05/2014      PT End of Session - 12/05/14 1353    Visit Number 6   Number of Visits 12   Date for PT Re-Evaluation 12/23/14   PT Start Time 0735   PT Stop Time 0805   PT Time Calculation (min) 30 min   Activity Tolerance Patient tolerated treatment well   Behavior During Therapy [facility] for tasks assessed/performed      Past Medical History  Diagnosis Date  . Hypertension   . Hyperlipidemia   . Anxiety   . Depression   . Anemia   . Diabetes mellitus   . Allergic rhinitis     Past Surgical History  Procedure Laterality Date  . Cholecystectomy    . Lumbar laminectomy      There were no vitals filed for this visit.  Visit Diagnosis:  Weakness of trunk musculature  Abnormal posture      Subjective Assessment - 12/05/14 1344    Multiple Pain Sites No                         [facility] Adult PT Treatment/Exercise - 12/05/14 0735    Lumbar Exercises: Supine   Ab Set 10 reps  Also 10 pelvic floor exercises 10 reps, good breathing   Clam 10 reps   Heel Slides 10 reps  cued to limit movement and stop when pelvis starts to move.   Bent Knee Raise 10 reps   Lumbar Exercises: Prone   Other Prone Lumbar Exercises Multifitus 5 reps , 5 second holds, and with knee flexion.                  PT Education - 12/05/14 1344    Education provided Yes   Education Details ADL handout   Person(s) Educated Patient   Methods Explanation;Demonstration;Verbal cues;Handout   Comprehension Verbalized understanding          PT Short Term Goals - 12/04/14 9476    PT SHORT TERM GOAL #1   Title She will be independent with inital HEP   Status  Achieved   PT SHORT TERM GOAL #2   Title She will report pain improved 30% or more   Status Achieved           PT Long Term Goals - 12/05/14 1353    PT LONG TERM GOAL #1   Title she will be independent with all HEP issued as of last visit.    Time 6   Period Weeks   Status Achieved   PT LONG TERM GOAL #2   Title She will report pain decreased 75% or more and tolerance to prolonged sitting and standing improved.    Baseline No pain   Time 6   Period Weeks   Status Achieved               Plan - 12/04/14 5465    Clinical Impression Statement She continues to do well with decreased pain and good abdominal contro; She appears to be doing HEP regularly. We will progreess   PT Next Visit Plan Review Pre pilates exercises and progress as able   PT Home Exercise Plan pre pilates exercise  Consulted and Agree with Plan of Care Patient        Problem List Patient Active Problem List   Diagnosis Date Noted  . Swelling of left knee joint 10/30/2014  . Diabetes 06/04/2014  . Pain in both feet 02/20/2014  . Headache(784.0) 11/01/2013  . Low back pain 01/30/2013  . Preventative health care 01/22/2011  . Palpitations 04/02/2008  . Hyperlipidemia 07/27/2007  . ANEMIA-NOS 04/12/2007  . ANXIETY 04/12/2007  . DEPRESSION 04/12/2007  . ALLERGIC RHINITIS 04/12/2007  . Essential hypertension 02/22/2007    Kahleel Fadeley 12/05/2014, 1:55 PM  [facility] [address] [place], [place], [address] Phone: [phone]   Fax:  [phone]  Melvenia Needles, PTA 12/05/2014 1:55 PM Phone: [phone] Fax: [phone]    PHYSICAL THERAPY DISCHARGE SUMMARY  Visits from Start of Care: 6  Current functional level related to goals / functional outcomes: All goals met   Remaining deficits: None   Education / Equipment: HEP for stretching and core stability Plan: Patient agrees to discharge.  Patient goals were met. Patient  is being discharged due to meeting the stated rehab goals.  ?????    Lillette Boxer Chasse PT    3:45 PM 12/05/14

## 2015-01-01 ENCOUNTER — Ambulatory Visit (INDEPENDENT_AMBULATORY_CARE_PROVIDER_SITE_OTHER): Payer: BC Managed Care – PPO | Admitting: Sports Medicine

## 2015-01-01 ENCOUNTER — Telehealth: Payer: Self-pay | Admitting: Sports Medicine

## 2015-01-01 ENCOUNTER — Encounter: Payer: Self-pay | Admitting: Sports Medicine

## 2015-01-01 VITALS — BP 158/89 | HR 92 | Ht 67.0 in | Wt 244.0 lb

## 2015-01-01 DIAGNOSIS — M1712 Unilateral primary osteoarthritis, left knee: Secondary | ICD-10-CM

## 2015-01-01 NOTE — Progress Notes (Signed)
  Subjective:    CC: Left knee pain  HPI: Left knee osteoarthritis: Two-month response to previous injection, now with recurrent pain and swelling. Moderate, persistent, localized at the medial joint line, under the patella.  Past medical history, Surgical history, Family history not pertinant except as noted below, Social history, Allergies, and medications have been entered into the medical record, reviewed, and no changes needed.   Review of Systems: No fevers, chills, night sweats, weight loss, chest pain, or shortness of breath.   Objective:    General: Well Developed, well nourished, and in no acute distress.  Neuro: Alert and oriented x3, extra-ocular muscles intact, sensation grossly intact.  HEENT: Normocephalic, atraumatic, pupils equal round reactive to light, neck supple, no masses, no lymphadenopathy, thyroid nonpalpable.  Skin: Warm and dry, no rashes. Cardiac: Regular rate and rhythm, no murmurs rubs or gallops, no lower extremity edema.  Respiratory: Clear to auscultation bilaterally. Not using accessory muscles, speaking in full sentences. Left Knee: Normal to inspection with no erythema or effusion or obvious bony abnormalities. Minimally tender to palpation at the medial joint line. ROM normal in flexion and extension and lower leg rotation. Ligaments with solid consistent endpoints including ACL, PCL, LCL, MCL. Negative Mcmurray's and provocative meniscal tests. Non painful patellar compression. Patellar and quadriceps tendons unremarkable. Hamstring and quadriceps strength is normal.  Procedure: Real-time Ultrasound Guided Injection of left knee Device: GE Logiq E  Verbal informed consent obtained.  Time-out conducted.  Noted no overlying erythema, induration, or other signs of local infection.  Skin prepped in a sterile fashion.  Local anesthesia: Topical Ethyl chloride.  With sterile technique and under real time ultrasound guidance:  2 mL kenalog 40, 4 mL  lidocaine injected easily. Completed without difficulty  Pain immediately resolved suggesting accurate placement of the medication.  Advised to call if fevers/chills, erythema, induration, drainage, or persistent bleeding.  Images permanently stored and available for review in the ultrasound unit.  Impression: Technically successful ultrasound guided injection. Impression and Recommendations:

## 2015-01-01 NOTE — Telephone Encounter (Signed)
-----   Message from Monica Bectonhomas J Thekkekandam, MD sent at 01/01/2015 12:04 PM EDT ----- Tor NettersHey Boogsie Shakes,  Primary osteoarthritis, failed steroid injections, nsaids, PT, approval for orthovisc left knee please. ___________________________________________ Ihor Austinhomas J. Benjamin Stainhekkekandam, M.D., ABFM., CAQSM. Primary Care and Sports Medicine [facility]  Adjunct Instructor of Family Medicine  [employer]

## 2015-01-01 NOTE — Telephone Encounter (Signed)
Submitted information for Orthovisc approval.

## 2015-01-01 NOTE — Assessment & Plan Note (Signed)
Two-month response to previous injection, repeat today due to pain and swelling, but we are going to need to get her approved for Orthovisc.

## 2015-01-03 ENCOUNTER — Ambulatory Visit (INDEPENDENT_AMBULATORY_CARE_PROVIDER_SITE_OTHER): Payer: BC Managed Care – PPO | Admitting: Internal Medicine

## 2015-01-03 ENCOUNTER — Encounter: Payer: Self-pay | Admitting: Internal Medicine

## 2015-01-03 ENCOUNTER — Ambulatory Visit (HOSPITAL_COMMUNITY): Payer: BC Managed Care – PPO | Attending: Internal Medicine

## 2015-01-03 VITALS — BP 142/86 | HR 92 | Temp 97.8°F | Ht 67.0 in | Wt 242.0 lb

## 2015-01-03 DIAGNOSIS — M79605 Pain in left leg: Secondary | ICD-10-CM

## 2015-01-03 DIAGNOSIS — M7989 Other specified soft tissue disorders: Secondary | ICD-10-CM | POA: Insufficient documentation

## 2015-01-03 DIAGNOSIS — E119 Type 2 diabetes mellitus without complications: Secondary | ICD-10-CM | POA: Diagnosis not present

## 2015-01-03 DIAGNOSIS — M79662 Pain in left lower leg: Secondary | ICD-10-CM

## 2015-01-03 DIAGNOSIS — I10 Essential (primary) hypertension: Secondary | ICD-10-CM

## 2015-01-03 NOTE — Progress Notes (Signed)
Pre visit review using our clinic review tool, if applicable. No additional management support is needed unless otherwise documented below in the visit note. 

## 2015-01-03 NOTE — Assessment & Plan Note (Signed)
possbily related to knee effusion now improved with cortisone, but cant r/o dvt  - for LLE venous doppler now, may need anticoag if abnormal

## 2015-01-03 NOTE — Progress Notes (Signed)
Subjective:    Patient ID: Jessica Daniels, female    DOB: [birth year], 54 y.o.   MRN: [id number]  HPI  Here to f/u, had recnet onset pain and swelling left knee now somewhat improved after cortisone per ortho, but persistent and mild worsening left leg below the knee pain and swelling persists, no hx of DVT. Pt denies chest pain, increased sob or doe, wheezing, orthopnea, PND, increased LE swelling, palpitations, dizziness or syncope.  Pt denies new neurological symptoms such as new headache, or facial or extremity weakness or numbness  No overt bleeding or bruising. No fever.   Pt denies polydipsia, polyuria, or low sugar symptoms such as weakness  Past Medical History  Diagnosis Date  . Hypertension   . Hyperlipidemia   . Anxiety   . Depression   . Anemia   . Diabetes mellitus   . Allergic rhinitis    Past Surgical History  Procedure Laterality Date  . Cholecystectomy    . Lumbar laminectomy      reports that she has never smoked. She does not have any smokeless tobacco history on file. She reports that she does not drink alcohol or use illicit drugs. family history includes Diabetes in an other family member. Allergies  Allergen Reactions  . Crestor [Rosuvastatin]     Elevated liver enzymes   Current Outpatient Prescriptions on File Prior to Visit  Medication Sig Dispense Refill  . amLODipine (NORVASC) 10 MG tablet Take 1 tablet (10 mg total) by mouth daily. 30 tablet 11  . benazepril (LOTENSIN) 40 MG tablet TAKE 1 TABLET BY MOUTH EVERY DAY 90 tablet 2  . Blood Glucose Monitoring Suppl (ONE TOUCH ULTRA 2) W/DEVICE KIT Use as directed , 1 device  250.02 1 each 0  . cyclobenzaprine (FLEXERIL) 10 MG tablet One half tab PO qHS, then increase gradually to one tab TID. 30 tablet 0  . glucose blood test strip Use as instructed 100 each 12  . Lancets MISC Use as directed 1 per day   250.02 100 each 12  . meloxicam (MOBIC) 15 MG tablet One tab PO qAM with breakfast for 2 weeks, then  daily prn pain. 30 tablet 3  . metFORMIN (GLUCOPHAGE) 1000 MG tablet Take 1 tablet (1,000 mg total) by mouth daily with breakfast. 30 tablet 11  . pioglitazone (ACTOS) 45 MG tablet Take 1 tablet (45 mg total) by mouth daily. 90 tablet 3  . sitaGLIPtin (JANUVIA) 100 MG tablet Take 1 tablet (100 mg total) by mouth daily. 90 tablet 3   No current facility-administered medications on file prior to visit.   Review of Systems  Constitutional: Negative for unusual diaphoresis or night sweats HENT: Negative for ringing in ear or discharge Eyes: Negative for double vision or worsening visual disturbance.  Respiratory: Negative for choking and stridor.   Gastrointestinal: Negative for vomiting or other signifcant bowel change Genitourinary: Negative for hematuria or change in urine volume.  Musculoskeletal: Negative for other MSK pain or swelling Skin: Negative for color change and worsening wound.  Neurological: Negative for tremors and numbness other than noted  Psychiatric/Behavioral: Negative for decreased concentration or agitation other than above       Objective:   Physical Exam BP 142/86 mmHg  Pulse 92  Temp(Src) 97.8 F (36.6 C) (Oral)  Ht '5\' 7"'  (1.702 m)  Wt 242 lb (109.77 kg)  BMI 37.89 kg/m2  SpO2 97%  LMP 12/26/2014 VS noted,  Constitutional: Pt appears in no significant distress HENT:  Head: NCAT.  Right Ear: External ear normal.  Left Ear: External ear normal.  Eyes: . Pupils are equal, round, and reactive to light. Conjunctivae and EOM are normal Neck: Normal range of motion. Neck supple.  Cardiovascular: Normal rate and regular rhythm.   Pulmonary/Chest: Effort normal and breath sounds without rales or wheezing.  Left knee NT, trace effusion, not warm or eythema LLE below the knee with diffuse trace to 1+ edema, + tender left post calf and knee but equivocal homans sign Neurological: Pt is alert. Not confused , motor grossly intact Skin: Skin is warm. No rash, no LE  edema Psychiatric: Pt behavior is normal. No agitation.     Assessment & Plan:

## 2015-01-03 NOTE — Assessment & Plan Note (Signed)
stable overall by history and exam, recent data reviewed with pt, and pt to continue medical treatment as before,  to f/u any worsening symptoms or concerns BP Readings from Last 3 Encounters:  01/03/15 142/86  01/01/15 158/89  11/27/14 148/87

## 2015-01-03 NOTE — Assessment & Plan Note (Signed)
stable overall by history and exam, recent data reviewed with pt, and pt to continue medical treatment as before,  to f/u any worsening symptoms or concerns Lab Results  Component Value Date   HGBA1C 6.7* 09/06/2014

## 2015-01-03 NOTE — Progress Notes (Unsigned)
Patient ID: Jessica Daniels, female   DOB: 05/06/1961, 54 y.o.   MRN: [id number] Left lower extremity negative for DVT/Baker's cyst

## 2015-01-03 NOTE — Patient Instructions (Addendum)
Please continue all other medications as before, and refills have been done if requested.  Please have the pharmacy call with any other refills you may need.  Please keep your appointments with your specialists as you may have planned  You will be contacted regarding the referral for: left leg venous doppler test to make sure no blood clot (to [facility])

## 2015-01-07 NOTE — Telephone Encounter (Signed)
Left message for Pt advising of insurance approval. Insurance will cover at 80% until OOP max has been met. Pt has met $700 towards OOP max of $3210. Provided callback information for Pt to discuss these values in more detail and to get Pt on schedule with Dr. Darene Lamer if he wants to begin the injections.

## 2015-01-07 NOTE — Telephone Encounter (Signed)
Pt returned clinic call, informed of her estimated OOP cost for these injections. Pt states she needs to think about it before she can commit to such a large amount of money. Pt states she is going next week for "surgery on her wrist" and after that she would like to try formal PT. States she [place] to cut down on her commute time. Advised her to think about the injections and think about locations for PT and call us after her upcoming surgery to discuss further plans. No further questions.

## 2015-01-09 MED ORDER — SODIUM HYALURONATE (VISCOSUP) 20 MG/2ML IX SOSY: PREFILLED_SYRINGE | INTRA_ARTICULAR | Status: DC

## 2015-01-09 NOTE — Telephone Encounter (Addendum)
Its probably Euflexxa, I'll write an rx and she can take it to her pharmacy, and bring us the syringes to inject. Rx in box.

## 2015-01-09 NOTE — Telephone Encounter (Signed)
Received a review on benefits today from [employer], the first fax of benefits I received was incorrect. The correct benefit details state: "[employer]th[employer] is not the preferred drug through [employer] [place] and will not be approved until Pt has tried and failed approved drug, then a PA will be required."  Contact the Pt again to advise of updated benefit information. Left information on voicemail and provided callback number. Will route to Dr. Benjamin Stainhekkekandam for review and approval on ordering [employer] [place] preferred drug (there was no specific drug name listed in the benefits information on which is the preferred drug).

## 2015-01-09 NOTE — Addendum Note (Signed)
Addended by: Monica BectonHEKKEKANDAM, Munachimso Palin J on: 01/09/2015 05:35 PM   Modules accepted: Orders

## 2015-01-10 NOTE — Telephone Encounter (Signed)
Euflexxa is not covered under Pt's insurance, will not even let us do a PA for it. Will route to Dr. Benjamin Stainhekkekandam to see if he would like to order a different injection.

## 2015-01-10 NOTE — Telephone Encounter (Signed)
No idea then, please have her call her insurance and find out what the preferred agent is for viscosupplementation. Jeebus.Marland Kitchen..Marland Kitchen

## 2015-01-10 NOTE — Telephone Encounter (Signed)
Called and spoke with Pt. Advised of new Rx, Euflexxa. Will send to her pharmacy, advised this will likely trigger a PA. Once that is complete, we will be able to determine what her OOP cost will be and if she wants to continue with ordering the Rx. No further questions at this time.

## 2015-01-13 MED ORDER — SODIUM HYALURONATE (VISCOSUP) 25 MG/2.5ML IX SOSY: PREFILLED_SYRINGE | INTRA_ARTICULAR | Status: DC

## 2015-01-13 NOTE — Telephone Encounter (Signed)
Prescription for Supartz sent to pharmacy

## 2015-01-13 NOTE — Addendum Note (Signed)
Addended by: Monica BectonHEKKEKANDAM, Abimbola Aki J on: 01/13/2015 12:31 PM   Modules accepted: Orders, Medications

## 2015-01-13 NOTE — Telephone Encounter (Signed)
Supartz may work, please advise.

## 2015-01-28 ENCOUNTER — Ambulatory Visit (INDEPENDENT_AMBULATORY_CARE_PROVIDER_SITE_OTHER): Payer: BC Managed Care – PPO | Admitting: Sports Medicine

## 2015-01-28 ENCOUNTER — Encounter: Payer: Self-pay | Admitting: Sports Medicine

## 2015-01-28 VITALS — BP 140/79 | HR 84 | Ht 67.0 in | Wt 245.0 lb

## 2015-01-28 DIAGNOSIS — M1712 Unilateral primary osteoarthritis, left knee: Secondary | ICD-10-CM

## 2015-01-28 NOTE — Progress Notes (Signed)
  Subjective:    CC: Follow-up  HPI: Left knee osteoarthritis: This pleasant 54 year old female returns, she is here a bit early, she has been through steroids injections which have provided a good response but she continues to have pain, we did ask her to pick up her Supartz, unfortunately she has not yet been to the pharmacy, and does not have the injections and hand.  She is also not taking meloxicam.  Past medical history, Surgical history, Family history not pertinant except as noted below, Social history, Allergies, and medications have been entered into the medical record, reviewed, and no changes needed.   Review of Systems: No fevers, chills, night sweats, weight loss, chest pain, or shortness of breath.   Objective:    General: Well Developed, well nourished, and in no acute distress.  Neuro: Alert and oriented x3, extra-ocular muscles intact, sensation grossly intact.  HEENT: Normocephalic, atraumatic, pupils equal round reactive to light, neck supple, no masses, no lymphadenopathy, thyroid nonpalpable.  Skin: Warm and dry, no rashes. Cardiac: Regular rate and rhythm, no murmurs rubs or gallops, no lower extremity edema.  Respiratory: Clear to auscultation bilaterally. Not using accessory muscles, speaking in full sentences. Left Knee: Normal to inspection with no erythema or effusion or obvious bony abnormalities. Palpation normal with no warmth or joint line tenderness or patellar tenderness or condyle tenderness. ROM normal in flexion and extension and lower leg rotation. Ligaments with solid consistent endpoints including ACL, PCL, LCL, MCL. Negative Mcmurray's and provocative meniscal tests. Non painful patellar compression. Patellar and quadriceps tendons unremarkable. Hamstring and quadriceps strength is normal.  Impression and Recommendations:

## 2015-01-28 NOTE — Assessment & Plan Note (Signed)
Still awaiting patient picking up her Supartz syringes for injection.

## 2015-01-28 NOTE — Telephone Encounter (Signed)
Rx was received by pharmacy but they are unable to fill due to insurance requesting this be sent to a speciality pharmacy. Pt advised to contact her plan and see which speciality pharmacy they prefer and we will send Rx to it.

## 2015-03-03 ENCOUNTER — Telehealth: Payer: Self-pay | Admitting: Internal Medicine

## 2015-03-03 ENCOUNTER — Other Ambulatory Visit: Payer: Self-pay | Admitting: Internal Medicine

## 2015-03-03 MED ORDER — PIOGLITAZONE HCL 45 MG PO TABS: 45.0000 mg | ORAL_TABLET | Freq: Every day | ORAL | Status: DC

## 2015-03-03 MED ORDER — AMLODIPINE BESYLATE 10 MG PO TABS: 10.0000 mg | ORAL_TABLET | Freq: Every day | ORAL | Status: DC

## 2015-03-03 MED ORDER — SITAGLIPTIN PHOSPHATE 100 MG PO TABS: 100.0000 mg | ORAL_TABLET | Freq: Every day | ORAL | Status: DC

## 2015-03-03 MED ORDER — ATORVASTATIN CALCIUM 20 MG PO TABS: 20.0000 mg | ORAL_TABLET | Freq: Every day | ORAL | Status: DC

## 2015-03-03 MED ORDER — METFORMIN HCL 1000 MG PO TABS: 1000.0000 mg | ORAL_TABLET | Freq: Every day | ORAL | Status: DC

## 2015-03-03 NOTE — Telephone Encounter (Signed)
All scripts sent to [facility]...Raechel Chute

## 2015-03-03 NOTE — Telephone Encounter (Signed)
Patient is requesting a refill of her meds to [facility] [address]   amLODipine (NORVASC) 10 MG tablet [[id number]]  metFORMIN (GLUCOPHAGE) 1000 MG tablet [[id number]] ENDED  sitaGLIPtin (JANUVIA) 100 MG tablet [[id number]]  pioglitazone (ACTOS) 45 MG tablet [[id number]]  atorvastatin (LIPITOR) 20 MG tablet  Advised that the atorvastatin and metformin show as discontinued, but she states that she takes them every day

## 2015-03-12 ENCOUNTER — Other Ambulatory Visit (INDEPENDENT_AMBULATORY_CARE_PROVIDER_SITE_OTHER): Payer: BC Managed Care – PPO

## 2015-03-12 ENCOUNTER — Encounter: Payer: Self-pay | Admitting: Internal Medicine

## 2015-03-12 ENCOUNTER — Ambulatory Visit (INDEPENDENT_AMBULATORY_CARE_PROVIDER_SITE_OTHER): Payer: BC Managed Care – PPO | Admitting: Internal Medicine

## 2015-03-12 VITALS — BP 122/84 | HR 72 | Temp 98.5°F | Ht 67.0 in | Wt 246.0 lb

## 2015-03-12 DIAGNOSIS — Z0000 Encounter for general adult medical examination without abnormal findings: Secondary | ICD-10-CM

## 2015-03-12 DIAGNOSIS — E119 Type 2 diabetes mellitus without complications: Secondary | ICD-10-CM

## 2015-03-12 LAB — CBC WITH DIFFERENTIAL/PLATELET
BASOS ABS: 0 10*3/uL (ref 0.0–0.1)
Basophils Relative: 0.3 % (ref 0.0–3.0)
EOS PCT: 1.9 % (ref 0.0–5.0)
Eosinophils Absolute: 0.1 10*3/uL (ref 0.0–0.7)
HCT: 37.5 % (ref 36.0–46.0)
HEMOGLOBIN: 11.8 g/dL — AB (ref 12.0–15.0)
LYMPHS ABS: 1.3 10*3/uL (ref 0.7–4.0)
LYMPHS PCT: 25.6 % (ref 12.0–46.0)
MCHC: 31.5 g/dL (ref 30.0–36.0)
MONOS PCT: 7.9 % (ref 3.0–12.0)
Monocytes Absolute: 0.4 10*3/uL (ref 0.1–1.0)
NEUTROS PCT: 64.3 % (ref 43.0–77.0)
Neutro Abs: 3.3 10*3/uL (ref 1.4–7.7)
Platelets: 324 10*3/uL (ref 150.0–400.0)
RBC: 5.41 Mil/uL — AB (ref 3.87–5.11)
RDW: 16.3 % — ABNORMAL HIGH (ref 11.5–15.5)
WBC: 5.1 10*3/uL (ref 4.0–10.5)

## 2015-03-12 LAB — BASIC METABOLIC PANEL
BUN: 10 mg/dL (ref 6–23)
CALCIUM: 10 mg/dL (ref 8.4–10.5)
CHLORIDE: 104 meq/L (ref 96–112)
CO2: 29 meq/L (ref 19–32)
CREATININE: 1 mg/dL (ref 0.40–1.20)
GFR: 74.76 mL/min (ref >60.00)
Glucose, Bld: 111 mg/dL — ABNORMAL HIGH (ref 70–99)
Potassium: 4.2 mEq/L (ref 3.5–5.1)
SODIUM: 140 meq/L (ref 135–145)

## 2015-03-12 LAB — URINALYSIS, ROUTINE W REFLEX MICROSCOPIC
BILIRUBIN URINE: NEGATIVE
Hgb urine dipstick: NEGATIVE
KETONES UR: NEGATIVE
LEUKOCYTES UA: NEGATIVE
NITRITE: NEGATIVE
PH: 7 (ref 5.0–8.0)
SPECIFIC GRAVITY, URINE: 1.01 (ref 1.000–1.030)
Total Protein, Urine: NEGATIVE
URINE GLUCOSE: NEGATIVE
UROBILINOGEN UA: 0.2 (ref 0.0–1.0)

## 2015-03-12 LAB — LIPID PANEL
CHOL/HDL RATIO: 2
Cholesterol: 133 mg/dL (ref 0–200)
HDL: 57.5 mg/dL (ref >39.00)
LDL Cholesterol: 57 mg/dL (ref 0–99)
NONHDL: 75.18
Triglycerides: 89 mg/dL (ref 0.0–149.0)
VLDL: 17.8 mg/dL (ref 0.0–40.0)

## 2015-03-12 LAB — MICROALBUMIN / CREATININE URINE RATIO
Creatinine,U: 99.3 mg/dL
MICROALB UR: 1 mg/dL (ref 0.0–1.9)
MICROALB/CREAT RATIO: 1 mg/g (ref 0.0–30.0)

## 2015-03-12 LAB — HEPATIC FUNCTION PANEL
ALK PHOS: 99 U/L (ref 39–117)
ALT: 12 U/L (ref 0–35)
AST: 18 U/L (ref 0–37)
Albumin: 4.5 g/dL (ref 3.5–5.2)
BILIRUBIN DIRECT: 0.1 mg/dL (ref 0.0–0.3)
BILIRUBIN TOTAL: 0.3 mg/dL (ref 0.2–1.2)
TOTAL PROTEIN: 7.8 g/dL (ref 6.0–8.3)

## 2015-03-12 LAB — TSH: TSH: 1.44 u[IU]/mL (ref 0.35–4.50)

## 2015-03-12 LAB — HEMOGLOBIN A1C: HEMOGLOBIN A1C: 6.3 % (ref 4.6–6.5)

## 2015-03-12 NOTE — Assessment & Plan Note (Signed)

## 2015-03-12 NOTE — Progress Notes (Signed)
Pre visit review using our clinic review tool, if applicable. No additional management support is needed unless otherwise documented below in the visit note. 

## 2015-03-12 NOTE — Assessment & Plan Note (Signed)
stable overall by history and exam, recent data reviewed with pt, and pt to continue medical treatment as before,  to f/u any worsening symptoms or concerns / Lab Results  Component Value Date   HGBA1C 6.7* 09/06/2014   Consider change actos to victoza but pt will consider for now due to increased cost

## 2015-03-12 NOTE — Progress Notes (Signed)
Subjective:    Patient ID: Jessica Daniels, female    DOB: [birth year], 54 y.o.   MRN: [id number]  HPI  Here for wellness and f/u;  Overall doing ok;  Pt denies Chest pain, worsening SOB, DOE, wheezing, orthopnea, PND, worsening LE edema, palpitations, dizziness or syncope.  Pt denies neurological change such as new headache, facial or extremity weakness.  Pt denies polydipsia, polyuria, or low sugar symptoms. Pt states overall good compliance with treatment and medications, good tolerability, and has been trying to follow appropriate diet.  Pt denies worsening depressive symptoms, suicidal ideation or panic. No fever, night sweats, wt loss, loss of appetite, or other constitutional symptoms.  Pt states good ability with ADL's, has low fall risk, home safety reviewed and adequate, no other significant changes in hearing or vision, and only occasionally active with exercise.  No current complaints except hard to lose wt since started the actos Past Medical History  Diagnosis Date  . Hypertension   . Hyperlipidemia   . Anxiety   . Depression   . Anemia   . Diabetes mellitus   . Allergic rhinitis    Past Surgical History  Procedure Laterality Date  . Cholecystectomy    . Lumbar laminectomy      reports that she has never smoked. She does not have any smokeless tobacco history on file. She reports that she does not drink alcohol or use illicit drugs. family history includes Diabetes in an other family member. Allergies  Allergen Reactions  . Crestor [Rosuvastatin]     Elevated liver enzymes   Current Outpatient Prescriptions on File Prior to Visit  Medication Sig Dispense Refill  . amLODipine (NORVASC) 10 MG tablet Take 1 tablet (10 mg total) by mouth daily. 90 tablet 1  . atorvastatin (LIPITOR) 20 MG tablet Take 1 tablet (20 mg total) by mouth daily. 90 tablet 1  . benazepril (LOTENSIN) 40 MG tablet TAKE 1 TABLET BY MOUTH EVERY DAY 90 tablet 2  . Blood Glucose Monitoring Suppl (ONE  TOUCH ULTRA 2) W/DEVICE KIT Use as directed , 1 device  250.02 1 each 0  . cyclobenzaprine (FLEXERIL) 10 MG tablet One half tab PO qHS, then increase gradually to one tab TID. 30 tablet 0  . glucose blood test strip Use as instructed 100 each 12  . Lancets MISC Use as directed 1 per day   250.02 100 each 12  . meloxicam (MOBIC) 15 MG tablet One tab PO qAM with breakfast for 2 weeks, then daily prn pain. 30 tablet 3  . metFORMIN (GLUCOPHAGE) 1000 MG tablet Take 1 tablet (1,000 mg total) by mouth daily with breakfast. 90 tablet 1  . pioglitazone (ACTOS) 45 MG tablet Take 1 tablet (45 mg total) by mouth daily. 90 tablet 1  . sitaGLIPtin (JANUVIA) 100 MG tablet Take 1 tablet (100 mg total) by mouth daily. 90 tablet 1  . Sodium Hyaluronate (SUPARTZ) 25 MG/2.5ML SOSY Inject weekly into left knee x5 weeks.  Pt to bring syringes to office. Dx primary OA knee 5 Syringe 0   No current facility-administered medications on file prior to visit.   Review of Systems Constitutional: Negative for increased diaphoresis, other activity, appetite or siginficant weight change other than noted HENT: Negative for worsening hearing loss, ear pain, facial swelling, mouth sores and neck stiffness.   Eyes: Negative for other worsening pain, redness or visual disturbance.  Respiratory: Negative for shortness of breath and wheezing  Cardiovascular: Negative for chest pain and palpitations.  Gastrointestinal: Negative for diarrhea, blood in stool, abdominal distention or other pain Genitourinary: Negative for hematuria, flank pain or change in urine volume.  Musculoskeletal: Negative for myalgias or other joint complaints.  Skin: Negative for color change and wound or drainage.  Neurological: Negative for syncope and numbness. other than noted Hematological: Negative for adenopathy. or other swelling Psychiatric/Behavioral: Negative for hallucinations, SI, self-injury, decreased concentration or other worsening agitation.       Objective:   Physical Exam BP 122/84 mmHg  Pulse 72  Temp(Src) 98.5 F (36.9 C) (Oral)  Ht '5\' 7"'  (1.702 m)  Wt 246 lb (111.585 kg)  BMI 38.52 kg/m2  SpO2 97%  LMP 02/22/2015 VS noted,  Constitutional: Pt is oriented to person, place, and time. Appears well-developed and well-nourished, in no significant distress Head: Normocephalic and atraumatic.  Right Ear: External ear normal.  Left Ear: External ear normal.  Nose: Nose normal.  Mouth/Throat: Oropharynx is clear and moist.  Eyes: Conjunctivae and EOM are normal. Pupils are equal, round, and reactive to light.  Neck: Normal range of motion. Neck supple. No JVD present. No tracheal deviation present or significant neck LA or mass Cardiovascular: Normal rate, regular rhythm, normal heart sounds and intact distal pulses.   Pulmonary/Chest: Effort normal and breath sounds without rales or wheezing  Abdominal: Soft. Bowel sounds are normal. NT. No HSM  Musculoskeletal: Normal range of motion. Exhibits no edema.  Lymphadenopathy:  Has no cervical adenopathy.  Neurological: Pt is alert and oriented to person, place, and time. Pt has normal reflexes. No cranial nerve deficit. Motor grossly intact Skin: Skin is warm and dry. No rash noted.  Psychiatric:  Has normal mood and affect. Behavior is normal.      Assessment & Plan:

## 2015-03-12 NOTE — Patient Instructions (Signed)

## 2015-03-13 ENCOUNTER — Encounter: Payer: Self-pay | Admitting: Internal Medicine

## 2015-03-13 LAB — HEPATITIS C ANTIBODY: HCV Ab: NEGATIVE

## 2015-07-17 ENCOUNTER — Telehealth: Payer: Self-pay | Admitting: Internal Medicine

## 2015-07-17 MED ORDER — BENAZEPRIL HCL 40 MG PO TABS: 40.0000 mg | ORAL_TABLET | Freq: Every day | ORAL | Status: DC

## 2015-07-17 NOTE — Telephone Encounter (Signed)
Pt is calling regarding her benazepril (LOTENSIN) 40 MG tablet [[id number] [facility] and it was denied. Please advise. Best number is [phone] [facility] to leave detailed msg

## 2015-09-02 ENCOUNTER — Telehealth: Payer: Self-pay | Admitting: *Deleted

## 2015-09-02 MED ORDER — ATORVASTATIN CALCIUM 20 MG PO TABS: 20.0000 mg | ORAL_TABLET | Freq: Every day | ORAL | Status: DC

## 2015-09-02 MED ORDER — BENAZEPRIL HCL 40 MG PO TABS: 40.0000 mg | ORAL_TABLET | Freq: Every day | ORAL | Status: DC

## 2015-09-02 MED ORDER — AMLODIPINE BESYLATE 10 MG PO TABS: 10.0000 mg | ORAL_TABLET | Freq: Every day | ORAL | Status: DC

## 2015-09-02 MED ORDER — PIOGLITAZONE HCL 45 MG PO TABS: 45.0000 mg | ORAL_TABLET | Freq: Every day | ORAL | Status: DC

## 2015-09-02 MED ORDER — SITAGLIPTIN PHOSPHATE 100 MG PO TABS: 100.0000 mg | ORAL_TABLET | Freq: Every day | ORAL | Status: DC

## 2015-09-02 NOTE — Telephone Encounter (Signed)
Left msg on triage needing refills on all medications. Notified pt rx's sent to [facility]...Raechel Chute

## 2015-09-05 ENCOUNTER — Other Ambulatory Visit: Payer: Self-pay | Admitting: *Deleted

## 2015-09-05 MED ORDER — METFORMIN HCL 1000 MG PO TABS: 1000.0000 mg | ORAL_TABLET | Freq: Every day | ORAL | Status: DC

## 2015-09-05 NOTE — Telephone Encounter (Signed)
Receive call pt stated she did not received her metformin she is needing rx sent to [facility]. Sent electronically...Jessica Daniels/lmb

## 2015-09-10 ENCOUNTER — Encounter: Payer: Self-pay | Admitting: Internal Medicine

## 2015-09-10 ENCOUNTER — Ambulatory Visit (INDEPENDENT_AMBULATORY_CARE_PROVIDER_SITE_OTHER): Payer: BC Managed Care – PPO | Admitting: Internal Medicine

## 2015-09-10 ENCOUNTER — Other Ambulatory Visit (INDEPENDENT_AMBULATORY_CARE_PROVIDER_SITE_OTHER): Payer: BC Managed Care – PPO

## 2015-09-10 VITALS — BP 126/80 | HR 80 | Temp 98.0°F | Resp 20 | Wt 257.0 lb

## 2015-09-10 DIAGNOSIS — Z0000 Encounter for general adult medical examination without abnormal findings: Secondary | ICD-10-CM

## 2015-09-10 DIAGNOSIS — I10 Essential (primary) hypertension: Secondary | ICD-10-CM

## 2015-09-10 DIAGNOSIS — E119 Type 2 diabetes mellitus without complications: Secondary | ICD-10-CM

## 2015-09-10 DIAGNOSIS — E785 Hyperlipidemia, unspecified: Secondary | ICD-10-CM

## 2015-09-10 LAB — CBC WITH DIFFERENTIAL/PLATELET
Basophils Absolute: 0 10*3/uL (ref 0.0–0.1)
Basophils Relative: 0.5 % (ref 0.0–3.0)
Eosinophils Absolute: 0.1 10*3/uL (ref 0.0–0.7)
Eosinophils Relative: 2.2 % (ref 0.0–5.0)
HEMATOCRIT: 35.9 % — AB (ref 36.0–46.0)
HEMOGLOBIN: 11.4 g/dL — AB (ref 12.0–15.0)
LYMPHS PCT: 26.3 % (ref 12.0–46.0)
Lymphs Abs: 1.4 10*3/uL (ref 0.7–4.0)
MCHC: 31.9 g/dL (ref 30.0–36.0)
MCV: 68.6 fl — ABNORMAL LOW (ref 78.0–100.0)
MONOS PCT: 6.5 % (ref 3.0–12.0)
Monocytes Absolute: 0.3 10*3/uL (ref 0.1–1.0)
Neutro Abs: 3.4 10*3/uL (ref 1.4–7.7)
Neutrophils Relative %: 64.5 % (ref 43.0–77.0)
Platelets: 334 10*3/uL (ref 150.0–400.0)
RBC: 5.23 Mil/uL — ABNORMAL HIGH (ref 3.87–5.11)
RDW: 16.2 % — AB (ref 11.5–15.5)
WBC: 5.3 10*3/uL (ref 4.0–10.5)

## 2015-09-10 LAB — BASIC METABOLIC PANEL
BUN: 13 mg/dL (ref 6–23)
CHLORIDE: 104 meq/L (ref 96–112)
CO2: 29 meq/L (ref 19–32)
CREATININE: 1.09 mg/dL (ref 0.40–1.20)
Calcium: 9.9 mg/dL (ref 8.4–10.5)
GFR: 67.55 mL/min (ref >60.00)
Glucose, Bld: 114 mg/dL — ABNORMAL HIGH (ref 70–99)
POTASSIUM: 4.1 meq/L (ref 3.5–5.1)
Sodium: 140 mEq/L (ref 135–145)

## 2015-09-10 LAB — URINALYSIS, ROUTINE W REFLEX MICROSCOPIC
BILIRUBIN URINE: NEGATIVE
HGB URINE DIPSTICK: NEGATIVE
Ketones, ur: NEGATIVE
LEUKOCYTES UA: NEGATIVE
NITRITE: NEGATIVE
RBC / HPF: NONE SEEN (ref 0–?)
Specific Gravity, Urine: 1.025 (ref 1.000–1.030)
Total Protein, Urine: NEGATIVE
URINE GLUCOSE: NEGATIVE
Urobilinogen, UA: 0.2 (ref 0.0–1.0)
pH: 6 (ref 5.0–8.0)

## 2015-09-10 LAB — LIPID PANEL
CHOL/HDL RATIO: 2
CHOLESTEROL: 133 mg/dL (ref 0–200)
HDL: 56 mg/dL (ref >39.00)
LDL CALC: 61 mg/dL (ref 0–99)
NonHDL: 76.51
TRIGLYCERIDES: 76 mg/dL (ref 0.0–149.0)
VLDL: 15.2 mg/dL (ref 0.0–40.0)

## 2015-09-10 LAB — HEPATIC FUNCTION PANEL
ALT: 12 U/L (ref 0–35)
AST: 18 U/L (ref 0–37)
Albumin: 4.4 g/dL (ref 3.5–5.2)
Alkaline Phosphatase: 101 U/L (ref 39–117)
BILIRUBIN DIRECT: 0 mg/dL (ref 0.0–0.3)
TOTAL PROTEIN: 7.6 g/dL (ref 6.0–8.3)
Total Bilirubin: 0.3 mg/dL (ref 0.2–1.2)

## 2015-09-10 LAB — MICROALBUMIN / CREATININE URINE RATIO
Creatinine,U: 202.7 mg/dL
Microalb Creat Ratio: 1.1 mg/g (ref 0.0–30.0)
Microalb, Ur: 2.3 mg/dL — ABNORMAL HIGH (ref 0.0–1.9)

## 2015-09-10 LAB — HEMOGLOBIN A1C: HEMOGLOBIN A1C: 6.6 % — AB (ref 4.6–6.5)

## 2015-09-10 LAB — TSH: TSH: 1.71 u[IU]/mL (ref 0.35–4.50)

## 2015-09-10 NOTE — Patient Instructions (Signed)

## 2015-09-10 NOTE — Assessment & Plan Note (Signed)
stable overall by history and exam, recent data reviewed with pt, and pt to continue medical treatment as before,  to f/u any worsening symptoms or concerns Lab Results  Component Value Date   LDLCALC 57 03/12/2015   For f/u lab, cont DM low chol diet

## 2015-09-10 NOTE — Assessment & Plan Note (Signed)

## 2015-09-10 NOTE — Assessment & Plan Note (Signed)
stable overall by history and exam, recent data reviewed with pt, and pt to continue medical treatment as before,  to f/u any worsening symptoms or concerns BP Readings from Last 3 Encounters:  09/10/15 126/80  03/12/15 122/84  01/28/15 140/79

## 2015-09-10 NOTE — Assessment & Plan Note (Signed)
.  stable overall by history and exam, recent data reviewed with pt, and pt to continue medical treatment as before,  to f/u any worsening symptoms or concerns Lab Results  Component Value Date   HGBA1C 6.3 03/12/2015   For f/u lab

## 2015-09-10 NOTE — Progress Notes (Signed)
Subjective:    Patient ID: Jessica Daniels, female    DOB: [birth year], 55 y.o.   MRN: [id number]  HPI  Here for wellness and f/u;  Overall doing ok;  Pt denies Chest pain, worsening SOB, DOE, wheezing, orthopnea, PND, worsening LE edema, palpitations, dizziness or syncope.  Pt denies neurological change such as new headache, facial or extremity weakness.  Pt denies polydipsia, polyuria, or low sugar symptoms. Pt states overall good compliance with treatment and medications, good tolerability, and has been trying to follow appropriate diet.  Pt denies worsening depressive symptoms, suicidal ideation or panic. No fever, night sweats, wt loss, loss of appetite, or other constitutional symptoms.  Pt states good ability with ADL's, has low fall risk, home safety reviewed and adequate, no other significant changes in hearing or vision, and only occasionally active with exercise.Has a gym membership and just does not go.   Also Has recent dx bilat knee DJD, s/p bilat cortisone by ortho - Dr Noemi Chapel.  Decliines for now immunizations and colonoscopy.  Hard to lose wt due to knee pain   BP Readings from Last 3 Encounters:  09/10/15 126/80  03/12/15 122/84  01/28/15 140/79   Wt Readings from Last 3 Encounters:  09/10/15 257 lb (116.574 kg)  03/12/15 246 lb (111.585 kg)  01/28/15 245 lb (111.131 kg)    Past Medical History  Diagnosis Date  . Hypertension   . Hyperlipidemia   . Anxiety   . Depression   . Anemia   . Diabetes mellitus   . Allergic rhinitis    Past Surgical History  Procedure Laterality Date  . Cholecystectomy    . Lumbar laminectomy      reports that she has never smoked. She does not have any smokeless tobacco history on file. She reports that she does not drink alcohol or use illicit drugs. family history is not on file. Allergies  Allergen Reactions  . Crestor [Rosuvastatin]     Elevated liver enzymes   Current Outpatient Prescriptions on File Prior to Visit  Medication  Sig Dispense Refill  . amLODipine (NORVASC) 10 MG tablet Take 1 tablet (10 mg total) by mouth daily. 90 tablet 1  . atorvastatin (LIPITOR) 20 MG tablet Take 1 tablet (20 mg total) by mouth daily. 90 tablet 1  . benazepril (LOTENSIN) 40 MG tablet Take 1 tablet (40 mg total) by mouth daily. 90 tablet 1  . Blood Glucose Monitoring Suppl (ONE TOUCH ULTRA 2) W/DEVICE KIT Use as directed , 1 device  250.02 1 each 0  . cyclobenzaprine (FLEXERIL) 10 MG tablet One half tab PO qHS, then increase gradually to one tab TID. 30 tablet 0  . glucose blood test strip Use as instructed 100 each 12  . Lancets MISC Use as directed 1 per day   250.02 100 each 12  . meloxicam (MOBIC) 15 MG tablet One tab PO qAM with breakfast for 2 weeks, then daily prn pain. 30 tablet 3  . metFORMIN (GLUCOPHAGE) 1000 MG tablet Take 1 tablet (1,000 mg total) by mouth daily with breakfast. 90 tablet 1  . pioglitazone (ACTOS) 45 MG tablet Take 1 tablet (45 mg total) by mouth daily. 90 tablet 1  . sitaGLIPtin (JANUVIA) 100 MG tablet Take 1 tablet (100 mg total) by mouth daily. 90 tablet 1  . Sodium Hyaluronate (SUPARTZ) 25 MG/2.5ML SOSY Inject weekly into left knee x5 weeks.  Pt to bring syringes to office. Dx primary OA knee 5 Syringe 0  No current facility-administered medications on file prior to visit.    Review of Systems Constitutional: Negative for increased diaphoresis, other activity, appetite or siginficant weight change other than noted HENT: Negative for worsening hearing loss, ear pain, facial swelling, mouth sores and neck stiffness.   Eyes: Negative for other worsening pain, redness or visual disturbance.  Respiratory: Negative for shortness of breath and wheezing  Cardiovascular: Negative for chest pain and palpitations.  Gastrointestinal: Negative for diarrhea, blood in stool, abdominal distention or other pain Genitourinary: Negative for hematuria, flank pain or change in urine volume.  Musculoskeletal: Negative  for myalgias or other joint complaints.  Skin: Negative for color change and wound or drainage.  Neurological: Negative for syncope and numbness. other than noted Hematological: Negative for adenopathy. or other swelling Psychiatric/Behavioral: Negative for hallucinations, SI, self-injury, decreased concentration or other worsening agitation.      Objective:   Physical Exam BP 126/80 mmHg  Pulse 80  Temp(Src) 98 F (36.7 C) (Oral)  Resp 20  Wt 257 lb (116.574 kg)  SpO2 95% VS noted, not ill appearing Constitutional: Pt is oriented to person, place, and time. Appears well-developed and well-nourished, in no significant distress Head: Normocephalic and atraumatic.  Right Ear: External ear normal.  Left Ear: External ear normal.  Nose: Nose normal.  Mouth/Throat: Oropharynx is clear and moist.  Eyes: Conjunctivae and EOM are normal. Pupils are equal, round, and reactive to light.  Neck: Normal range of motion. Neck supple. No JVD present. No tracheal deviation present or significant neck LA or mass Cardiovascular: Normal rate, regular rhythm, normal heart sounds and intact distal pulses.   Pulmonary/Chest: Effort normal and breath sounds without rales or wheezing  Abdominal: Soft. Bowel sounds are normal. NT. No HSM  Musculoskeletal: Normal range of motion. Exhibits no edema.  Lymphadenopathy:  Has no cervical adenopathy.  Neurological: Pt is alert and oriented to person, place, and time. Pt has normal reflexes. No cranial nerve deficit. Motor grossly intact Skin: Skin is warm and dry. No rash noted.  Psychiatric:  Has normal mood and affect. Behavior is normal.  Bilat knee crepitus NT, without effusion    Assessment & Plan:

## 2015-09-10 NOTE — Progress Notes (Signed)
Pre visit review using our clinic review tool, if applicable. No additional management support is needed unless otherwise documented below in the visit note. 

## 2015-11-13 ENCOUNTER — Ambulatory Visit (INDEPENDENT_AMBULATORY_CARE_PROVIDER_SITE_OTHER): Payer: BC Managed Care – PPO | Admitting: Internal Medicine

## 2015-11-13 ENCOUNTER — Encounter: Payer: Self-pay | Admitting: Internal Medicine

## 2015-11-13 VITALS — BP 126/82 | HR 80 | Temp 98.4°F | Resp 20 | Wt 259.0 lb

## 2015-11-13 DIAGNOSIS — R05 Cough: Secondary | ICD-10-CM

## 2015-11-13 DIAGNOSIS — E119 Type 2 diabetes mellitus without complications: Secondary | ICD-10-CM

## 2015-11-13 DIAGNOSIS — R059 Cough, unspecified: Secondary | ICD-10-CM | POA: Insufficient documentation

## 2015-11-13 DIAGNOSIS — I10 Essential (primary) hypertension: Secondary | ICD-10-CM | POA: Diagnosis not present

## 2015-11-13 MED ORDER — HYDROCODONE-HOMATROPINE 5-1.5 MG/5ML PO SYRP: 5.0000 mL | ORAL_SOLUTION | Freq: Four times a day (QID) | ORAL | Status: DC | PRN

## 2015-11-13 MED ORDER — HYDROCHLOROTHIAZIDE 12.5 MG PO CAPS: 12.5000 mg | ORAL_CAPSULE | Freq: Every day | ORAL | Status: DC

## 2015-11-13 MED ORDER — AZITHROMYCIN 250 MG PO TABS: ORAL_TABLET | ORAL | Status: DC

## 2015-11-13 NOTE — Progress Notes (Signed)
Pre visit review using our clinic review tool, if applicable. No additional management support is needed unless otherwise documented below in the visit note. 

## 2015-11-13 NOTE — Assessment & Plan Note (Signed)
stable overall by history and exam, recent data reviewed with pt, and pt to continue medical treatment as before,  to f/u any worsening symptoms or concerns Lab Results  Component Value Date   HGBA1C 6.6* 09/10/2015

## 2015-11-13 NOTE — Assessment & Plan Note (Signed)
Mild to mod, c/w bronchitis vs pna, declines cxr, for antibx course, to f/u any worsening symptoms or concerns  

## 2015-11-13 NOTE — Progress Notes (Signed)
Subjective:    Patient ID: Jessica Daniels, female    DOB: 1960/08/18, 55 y.o.   MRN: [id number]  HPI  Here with acute onset mild to mod 2-3 days ST, HA, general weakness and malaise, with prod cough greenish sputum, but Pt denies chest pain, increased sob or doe, wheezing, orthopnea, PND, palpitations, dizziness or syncope, but has some intermittent swelling down in the am, worse the next day with sitting and standing on current meds..  Pt denies new neurological symptoms such as new headache, or facial or extremity weakness or numbness   Pt denies polydipsia, polyuria, or low sugar symptoms such as weakness or confusion improved with po intake.  Pt states overall good compliance with meds, trying to follow lower cholesterol, diabetic diet, wt overall stable but little exercise however.    Past Medical History  Diagnosis Date  . Hypertension   . Hyperlipidemia   . Anxiety   . Depression   . Anemia   . Diabetes mellitus   . Allergic rhinitis    Past Surgical History  Procedure Laterality Date  . Cholecystectomy    . Lumbar laminectomy      reports that she has never smoked. She does not have any smokeless tobacco history on file. She reports that she does not drink alcohol or use illicit drugs. family history is not on file. Allergies  Allergen Reactions  . Crestor [Rosuvastatin]     Elevated liver enzymes   Current Outpatient Prescriptions on File Prior to Visit  Medication Sig Dispense Refill  . amLODipine (NORVASC) 10 MG tablet Take 1 tablet (10 mg total) by mouth daily. 90 tablet 1  . atorvastatin (LIPITOR) 20 MG tablet Take 1 tablet (20 mg total) by mouth daily. 90 tablet 1  . benazepril (LOTENSIN) 40 MG tablet Take 1 tablet (40 mg total) by mouth daily. 90 tablet 1  . Blood Glucose Monitoring Suppl (ONE TOUCH ULTRA 2) W/DEVICE KIT Use as directed , 1 device  250.02 1 each 0  . cyclobenzaprine (FLEXERIL) 10 MG tablet One half tab PO qHS, then increase gradually to one tab  TID. 30 tablet 0  . glucose blood test strip Use as instructed 100 each 12  . Lancets MISC Use as directed 1 per day   250.02 100 each 12  . meloxicam (MOBIC) 15 MG tablet One tab PO qAM with breakfast for 2 weeks, then daily prn pain. 30 tablet 3  . metFORMIN (GLUCOPHAGE) 1000 MG tablet Take 1 tablet (1,000 mg total) by mouth daily with breakfast. 90 tablet 1  . pioglitazone (ACTOS) 45 MG tablet Take 1 tablet (45 mg total) by mouth daily. 90 tablet 1  . sitaGLIPtin (JANUVIA) 100 MG tablet Take 1 tablet (100 mg total) by mouth daily. 90 tablet 1  . Sodium Hyaluronate (SUPARTZ) 25 MG/2.5ML SOSY Inject weekly into left knee x5 weeks.  Pt to bring syringes to office. Dx primary OA knee 5 Syringe 0   No current facility-administered medications on file prior to visit.   Review of Systems  Constitutional: Negative for unusual diaphoresis or night sweats HENT: Negative for ear swelling or discharge Eyes: Negative for worsening visual haziness  Respiratory: Negative for choking and stridor.   Gastrointestinal: Negative for distension or worsening eructation Genitourinary: Negative for retention or change in urine volume.  Musculoskeletal: Negative for other MSK pain or swelling Skin: Negative for color change and worsening wound Neurological: Negative for tremors and numbness other than noted  Psychiatric/Behavioral: Negative for  decreased concentration or agitation other than above       Objective:   Physical Exam BP 126/82 mmHg  Pulse 80  Temp(Src) 98.4 F (36.9 C) (Oral)  Resp 20  Wt 259 lb (117.482 kg)  SpO2 95% VS noted, mild ill Constitutional: Pt appears in no apparent distress HENT: Head: NCAT.  Right Ear: External ear normal.  Left Ear: External ear normal.  Bilat tm's with mild erythema.  Max sinus areas non tender.  Pharynx with mild erythema, no exudate Eyes: . Pupils are equal, round, and reactive to light. Conjunctivae and EOM are normal Neck: Normal range of motion.  Neck supple.  Cardiovascular: Normal rate and regular rhythm.   Pulmonary/Chest: Effort normal and breath sounds without rales or wheezing.  Neurological: Pt is alert. Not confused , motor grossly intact Skin: Skin is warm. No rash, no LE edema Psychiatric: Pt behavior is normal. No agitation.     Assessment & Plan:

## 2015-11-13 NOTE — Assessment & Plan Note (Signed)
C/w intermittent edema, none on exam today, BP borderlines elev o/w stable overall by history and exam, recent data reviewed with pt, and pt to start hct 12.5 qd,,  to f/u any worsening symptoms or concerns

## 2015-11-13 NOTE — Patient Instructions (Signed)
Please take all new medication as prescribed  - the fluid pill, as well as the antibiotic and cough medicine if needed  Please continue all other medications as before, and refills have been done if requested.  Please have the pharmacy call with any other refills you may need.  Please continue your efforts at being more active, low cholesterol diet, and weight control.  Please keep your appointments with your specialists as you may have planned

## 2016-03-04 ENCOUNTER — Telehealth: Payer: Self-pay | Admitting: *Deleted

## 2016-03-04 MED ORDER — ATORVASTATIN CALCIUM 20 MG PO TABS: 20.0000 mg | ORAL_TABLET | Freq: Every day | ORAL | 0 refills | Status: DC

## 2016-03-04 MED ORDER — METFORMIN HCL 1000 MG PO TABS: 1000.0000 mg | ORAL_TABLET | Freq: Every day | ORAL | 0 refills | Status: DC

## 2016-03-04 MED ORDER — SITAGLIPTIN PHOSPHATE 100 MG PO TABS: 100.0000 mg | ORAL_TABLET | Freq: Every day | ORAL | 0 refills | Status: DC

## 2016-03-04 MED ORDER — HYDROCHLOROTHIAZIDE 12.5 MG PO CAPS: 12.5000 mg | ORAL_CAPSULE | Freq: Every day | ORAL | 0 refills | Status: DC

## 2016-03-04 MED ORDER — BENAZEPRIL HCL 40 MG PO TABS: 40.0000 mg | ORAL_TABLET | Freq: Every day | ORAL | 0 refills | Status: DC

## 2016-03-04 MED ORDER — AMLODIPINE BESYLATE 10 MG PO TABS: 10.0000 mg | ORAL_TABLET | Freq: Every day | ORAL | 0 refills | Status: DC

## 2016-03-04 MED ORDER — PIOGLITAZONE HCL 45 MG PO TABS: 45.0000 mg | ORAL_TABLET | Freq: Every day | ORAL | 0 refills | Status: DC

## 2016-03-04 NOTE — Telephone Encounter (Signed)
Sent 30 [facility] supply to [facility]...Raechel Chute/lmb

## 2016-03-04 NOTE — Telephone Encounter (Signed)
Left msg on triage stating currently out of medications, and her appt is not until Sept. Requesting refill on all meds to be sent to [facility]....Jessica Daniels/lmb

## 2016-03-12 ENCOUNTER — Ambulatory Visit: Payer: BC Managed Care – PPO | Admitting: Internal Medicine

## 2016-03-18 ENCOUNTER — Encounter: Payer: Self-pay | Admitting: Internal Medicine

## 2016-03-18 ENCOUNTER — Ambulatory Visit (INDEPENDENT_AMBULATORY_CARE_PROVIDER_SITE_OTHER): Payer: BC Managed Care – PPO | Admitting: Internal Medicine

## 2016-03-18 ENCOUNTER — Other Ambulatory Visit: Payer: Self-pay | Admitting: *Deleted

## 2016-03-18 ENCOUNTER — Other Ambulatory Visit (INDEPENDENT_AMBULATORY_CARE_PROVIDER_SITE_OTHER): Payer: BC Managed Care – PPO

## 2016-03-18 VITALS — BP 138/78 | HR 82 | Temp 98.7°F | Resp 20 | Wt 260.0 lb

## 2016-03-18 DIAGNOSIS — E119 Type 2 diabetes mellitus without complications: Secondary | ICD-10-CM

## 2016-03-18 DIAGNOSIS — I10 Essential (primary) hypertension: Secondary | ICD-10-CM

## 2016-03-18 DIAGNOSIS — R6889 Other general symptoms and signs: Secondary | ICD-10-CM

## 2016-03-18 DIAGNOSIS — E785 Hyperlipidemia, unspecified: Secondary | ICD-10-CM

## 2016-03-18 DIAGNOSIS — Z0001 Encounter for general adult medical examination with abnormal findings: Secondary | ICD-10-CM

## 2016-03-18 LAB — HEPATIC FUNCTION PANEL
ALT: 16 U/L (ref 0–35)
AST: 19 U/L (ref 0–37)
Albumin: 4.5 g/dL (ref 3.5–5.2)
Alkaline Phosphatase: 115 U/L (ref 39–117)
BILIRUBIN DIRECT: 0.1 mg/dL (ref 0.0–0.3)
BILIRUBIN TOTAL: 0.3 mg/dL (ref 0.2–1.2)
Total Protein: 8 g/dL (ref 6.0–8.3)

## 2016-03-18 LAB — BASIC METABOLIC PANEL
BUN: 7 mg/dL (ref 6–23)
CHLORIDE: 102 meq/L (ref 96–112)
CO2: 30 mEq/L (ref 19–32)
Calcium: 10 mg/dL (ref 8.4–10.5)
Creatinine, Ser: 0.93 mg/dL (ref 0.40–1.20)
GFR: 80.97 mL/min (ref >60.00)
Glucose, Bld: 113 mg/dL — ABNORMAL HIGH (ref 70–99)
POTASSIUM: 4 meq/L (ref 3.5–5.1)
SODIUM: 139 meq/L (ref 135–145)

## 2016-03-18 LAB — HEMOGLOBIN A1C: Hgb A1c MFr Bld: 6.6 % — ABNORMAL HIGH (ref 4.6–6.5)

## 2016-03-18 LAB — LIPID PANEL
CHOL/HDL RATIO: 3
Cholesterol: 149 mg/dL (ref 0–200)
HDL: 56.7 mg/dL (ref >39.00)
LDL CALC: 65 mg/dL (ref 0–99)
NONHDL: 91.81
Triglycerides: 134 mg/dL (ref 0.0–149.0)
VLDL: 26.8 mg/dL (ref 0.0–40.0)

## 2016-03-18 MED ORDER — ONETOUCH ULTRASOFT LANCETS MISC: 1.0000 | Freq: Two times a day (BID) | 5 refills | Status: DC

## 2016-03-18 MED ORDER — GLIPIZIDE ER 5 MG PO TB24: 5.0000 mg | ORAL_TABLET | Freq: Every day | ORAL | 3 refills | Status: DC

## 2016-03-18 MED ORDER — GLUCOSE BLOOD VI STRP: 1.0000 | ORAL_STRIP | Freq: Two times a day (BID) | 5 refills | Status: DC

## 2016-03-18 NOTE — Progress Notes (Signed)
Subjective:    Patient ID: Jessica Daniels, female    DOB: 01/22/1961, 55 y.o.   MRN: [id number]  HPI  Here to f/u; overall doing ok,  Pt denies chest pain, increasing sob or doe, wheezing, orthopnea, PND, increased LE swelling, palpitations, dizziness or syncope.  Pt denies new neurological symptoms such as new headache, or facial or extremity weakness or numbness.  Pt denies polydipsia, polyuria, or low sugar episode.   Pt denies new neurological symptoms such as new headache, or facial or extremity weakness or numbness.   Pt states overall good compliance with meds, mostly trying to follow appropriate diet, with wt overall stable,  but little exercise however. Stopped the actos due to wt gain, leg swelling, fatigue about 1 wk ago.  Wt Readings from Last 3 Encounters:  03/18/16 260 lb (117.9 kg)  11/13/15 259 lb (117.5 kg)  09/10/15 257 lb (116.6 kg)   Past Medical History:  Diagnosis Date  . Allergic rhinitis   . Anemia   . Anxiety   . Depression   . Diabetes mellitus   . Hyperlipidemia   . Hypertension    Past Surgical History:  Procedure Laterality Date  . CHOLECYSTECTOMY    . LUMBAR LAMINECTOMY      reports that she has never smoked. She does not have any smokeless tobacco history on file. She reports that she does not drink alcohol or use drugs. family history is not on file. Allergies  Allergen Reactions  . Crestor [Rosuvastatin]     Elevated liver enzymes   Current Outpatient Prescriptions on File Prior to Visit  Medication Sig Dispense Refill  . amLODipine (NORVASC) 10 MG tablet Take 1 tablet (10 mg total) by mouth daily. Keep sept appt for future refills 30 tablet 0  . atorvastatin (LIPITOR) 20 MG tablet Take 1 tablet (20 mg total) by mouth daily. Keep Sept appt for future refills 30 tablet 0  . benazepril (LOTENSIN) 40 MG tablet Take 1 tablet (40 mg total) by mouth daily. Keep Sept appt for future refills 30 tablet 0  . Blood Glucose Monitoring Suppl (ONE TOUCH  ULTRA 2) W/DEVICE KIT Use as directed , 1 device  250.02 1 each 0  . cyclobenzaprine (FLEXERIL) 10 MG tablet One half tab PO qHS, then increase gradually to one tab TID. 30 tablet 0  . glucose blood test strip Use as instructed 100 each 12  . hydrochlorothiazide (MICROZIDE) 12.5 MG capsule Take 1 capsule (12.5 mg total) by mouth daily. Keep Sept appt for future refills 30 capsule 0  . Lancets MISC Use as directed 1 per day   250.02 100 each 12  . meloxicam (MOBIC) 15 MG tablet One tab PO qAM with breakfast for 2 weeks, then daily prn pain. 30 tablet 3  . metFORMIN (GLUCOPHAGE) 1000 MG tablet Take 1 tablet (1,000 mg total) by mouth daily with breakfast. Keep Sept appt for future refills 30 tablet 0  . sitaGLIPtin (JANUVIA) 100 MG tablet Take 1 tablet (100 mg total) by mouth daily. Keep Sept appt for future refills 30 tablet 0  . Sodium Hyaluronate (SUPARTZ) 25 MG/2.5ML SOSY Inject weekly into left knee x5 weeks.  Pt to bring syringes to office. Dx primary OA knee 5 Syringe 0   No current facility-administered medications on file prior to visit.    Review of Systems  Constitutional: Negative for unusual diaphoresis or night sweats HENT: Negative for ear swelling or discharge Eyes: Negative for worsening visual haziness  Respiratory:  Negative for choking and stridor.   Gastrointestinal: Negative for distension or worsening eructation Genitourinary: Negative for retention or change in urine volume.  Musculoskeletal: Negative for other MSK pain or swelling Skin: Negative for color change and worsening wound Neurological: Negative for tremors and numbness other than noted  Psychiatric/Behavioral: Negative for decreased concentration or agitation other than above       Objective:   Physical Exam BP 138/78   Pulse 82   Temp 98.7 F (37.1 C) (Oral)   Resp 20   Wt 260 lb (117.9 kg)   SpO2 95%   BMI 40.72 kg/m  VS noted,  Constitutional: Pt appears in no apparent distress HENT: Head: NCAT.   Right Ear: External ear normal.  Left Ear: External ear normal.  Eyes: . Pupils are equal, round, and reactive to light. Conjunctivae and EOM are normal Neck: Normal range of motion. Neck supple.  Cardiovascular: Normal rate and regular rhythm.   Pulmonary/Chest: Effort normal and breath sounds without rales or wheezing.  Neurological: Pt is alert. Not confused , motor grossly intact Skin: Skin is warm. No rash, no LE edema Psychiatric: Pt behavior is normal. No agitation.     Assessment & Plan:

## 2016-03-18 NOTE — Patient Instructions (Signed)
Ok to stop the actos as you have  Please take all new medication as prescribed - the glipizide ER 5 mg per day  Please continue all other medications as before, and refills have been done if requested.  Please have the pharmacy call with any other refills you may need.  Please continue your efforts at being more active, low cholesterol diabetic diet, and weight control.  Please keep your appointments with your specialists as you may have planned  Please go to the LAB in the Basement (turn left off the elevator) for the tests to be done today  You will be contacted by phone if any changes need to be made immediately.  Otherwise, you will receive a letter about your results with an explanation, but please check with MyChart first.  Please remember to sign up for MyChart if you have not done so, as this will be important to you in the future with finding out test results, communicating by private email, and scheduling acute appointments online when needed.  Please return in 6 months, or sooner if needed, with Lab testing done 3-5 days before

## 2016-03-18 NOTE — Telephone Encounter (Signed)
Rec'd call pt states she saw MD today fail to ask for refills on her testing strips & lancets. Verified moniior sent in one touch ultra supplies...Raechel Chute/lmb

## 2016-03-18 NOTE — Progress Notes (Signed)
Pre visit review using our clinic review tool, if applicable. No additional management support is needed unless otherwise documented below in the visit note. 

## 2016-03-21 NOTE — Assessment & Plan Note (Signed)
Likely mild uncontrolled as no longer taking actos, will change to glipizide ER 5 qd, dc actos, for f/u today and next visit, cont diet, wt loss efforts Lab Results  Component Value Date   HGBA1C 6.6 (H) 03/18/2016

## 2016-03-21 NOTE — Assessment & Plan Note (Signed)
stable overall by history and exam, recent data reviewed with pt, and pt to continue medical treatment as before,  to f/u any worsening symptoms or concerns Lab Results  Component Value Date   LDLCALC 65 03/18/2016

## 2016-03-21 NOTE — Assessment & Plan Note (Signed)
stable overall by history and exam, recent data reviewed with pt, and pt to continue medical treatment as before,  to f/u any worsening symptoms or concerns BP Readings from Last 3 Encounters:  03/18/16 138/78  11/13/15 126/82  09/10/15 126/80

## 2016-04-02 ENCOUNTER — Other Ambulatory Visit: Payer: Self-pay | Admitting: *Deleted

## 2016-04-02 MED ORDER — METFORMIN HCL 1000 MG PO TABS: 1000.0000 mg | ORAL_TABLET | Freq: Every day | ORAL | 3 refills | Status: DC

## 2016-04-02 MED ORDER — BENAZEPRIL HCL 40 MG PO TABS: 40.0000 mg | ORAL_TABLET | Freq: Every day | ORAL | 3 refills | Status: DC

## 2016-04-02 MED ORDER — GLIPIZIDE ER 5 MG PO TB24: 5.0000 mg | ORAL_TABLET | Freq: Every day | ORAL | 3 refills | Status: DC

## 2016-04-02 MED ORDER — ATORVASTATIN CALCIUM 20 MG PO TABS: 20.0000 mg | ORAL_TABLET | Freq: Every day | ORAL | 3 refills | Status: DC

## 2016-04-02 MED ORDER — AMLODIPINE BESYLATE 10 MG PO TABS: 10.0000 mg | ORAL_TABLET | Freq: Every day | ORAL | 0 refills | Status: DC

## 2016-04-02 MED ORDER — SITAGLIPTIN PHOSPHATE 100 MG PO TABS: 100.0000 mg | ORAL_TABLET | Freq: Every day | ORAL | 3 refills | Status: DC

## 2016-04-02 MED ORDER — HYDROCHLOROTHIAZIDE 12.5 MG PO CAPS: 12.5000 mg | ORAL_CAPSULE | Freq: Every day | ORAL | 3 refills | Status: DC

## 2016-04-19 ENCOUNTER — Telehealth: Payer: Self-pay | Admitting: Internal Medicine

## 2016-04-19 NOTE — Telephone Encounter (Signed)
[facility] - Client TELEPHONE ADVICE RECORD [facility] Patient Name: Jessica PostinLMER [place]AK-[place]NS DOB: 06/23/1961 Initial Comment Caller states having chest pain Nurse Assessment Nurse: Lane HackerHarley, RN, Windy Date/Time (Eastern Time): 04/19/2016 12:31:48 PM Confirm and document reason for call. If symptomatic, describe symptoms. You must click the next button to save text entered. ---Caller states that yesterday was last episode that she had chest pain. Its been off/on almost every day, sometimes back to back daily, other times skips a day. Not severe. Pain was 3/10 like a sharp shooting quick pain and doesn't stay long. Noticed especially at night, and laying down. No sour taste in mouth. Has the patient traveled out of the country within the last 30 days? ---Not Applicable Does the patient have any new or worsening symptoms? ---Yes Will a triage be completed? ---Yes Related visit to physician within the last 2 weeks? ---No Does the PT have any chronic conditions? (i.e. diabetes, asthma, etc.) ---Yes List chronic conditions. ---Diabetic, HTN Is the patient pregnant or possibly pregnant? (Ask all females between the ages of 3412-55) ---No Is this a behavioral health or substance abuse call? ---No Guidelines Guideline Title Affirmed Question Affirmed Notes Chest Pain [1] Chest pain lasting <= 5 minutes AND [2] NO chest pain or cardiac symptoms now (Exceptions: pains lasting a few seconds) Final Disposition User See Physician within [address]24 Hours [place], Charity fundraiserN, Windy Comments No [facility] or [facility] RN made appt with Dr. Kriste BasqueHannah Kim [facility]. Referrals REFERRED TO PCP OFFICE Disagree/Comply: Comply

## 2016-04-20 ENCOUNTER — Ambulatory Visit (INDEPENDENT_AMBULATORY_CARE_PROVIDER_SITE_OTHER): Payer: BC Managed Care – PPO | Admitting: Family Medicine

## 2016-04-20 ENCOUNTER — Encounter: Payer: Self-pay | Admitting: Family Medicine

## 2016-04-20 VITALS — BP 122/82 | HR 88 | Temp 97.8°F | Ht 67.0 in | Wt 255.6 lb

## 2016-04-20 DIAGNOSIS — R0789 Other chest pain: Secondary | ICD-10-CM | POA: Diagnosis not present

## 2016-04-20 DIAGNOSIS — I10 Essential (primary) hypertension: Secondary | ICD-10-CM

## 2016-04-20 DIAGNOSIS — E119 Type 2 diabetes mellitus without complications: Secondary | ICD-10-CM

## 2016-04-20 DIAGNOSIS — E785 Hyperlipidemia, unspecified: Secondary | ICD-10-CM | POA: Diagnosis not present

## 2016-04-20 NOTE — Patient Instructions (Addendum)
-  We placed a referral for you as discussed. It usually takes about 1-2 weeks to process and schedule this referral. If you have not heard from us regarding this appointment in 2 weeks please contact our office.  -Seek care immediately if worsening or new symptoms occur.  -please also schedule follow up with your primary doctor if cardiology evaluation normal and you have persistent symptoms.

## 2016-04-20 NOTE — Progress Notes (Signed)
HPI:  Jessica Daniels' is a pleasant 55 year old, whom I have never seen before, with a listed past medical history significant for anxiety, depression, allergies, diabetes, hyperlipidemia and hypertension here for an acute visit for chest pain.  She has no symptoms today. She reports she has had intermittent chest pain for 4-5 months. The pain occurs several times per month. It usually occurs at rest, at night while lying down, but can occur at any time. The last episode of this pain occurred 3 days ago when she was lying in bed. She describes the pain as a mild sternal chest pain that last less than 1 minute and resolves on its own. She denies acid reflux, shortness of breath, wheezing, cough, fevers, malaise, orthopnea, swelling, palpitations, cough or injury. She reports she has had chest pain in the past remotely and had an evaluation with cardiologist, but she feels that she needs another cardiology evaluation. She reports she is here today to request a referral to cardiology. She is very worried about her heart, she has diabetes, hyperlipidemia, hypertension and a family history of heart disease.  ROS: See pertinent positives and negatives per HPI.  Past Medical History:  Diagnosis Date  . Allergic rhinitis   . Anemia   . Anxiety   . Depression   . Diabetes mellitus   . Hyperlipidemia   . Hypertension     Past Surgical History:  Procedure Laterality Date  . CHOLECYSTECTOMY    . LUMBAR LAMINECTOMY      Family History  Problem Relation Age of Onset  . Diabetes      1st degree relative    Social History   Social History  . Marital status: Married    Spouse name: N/A  . Number of children: N/A  . 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 Smoking status: Never Smoker  . Smokeless tobacco: None  . Alcohol use No  . Drug use: No  . Sexual activity: Not Asked   Other Topics Concern  .  None   Social History Narrative  . None     Current Outpatient Prescriptions:  .  amLODipine (NORVASC) 10 MG tablet, Take 1 tablet (10 mg total) by mouth daily. Keep sept appt for future refills, Disp: 30 tablet, Rfl: 0 .  atorvastatin (LIPITOR) 20 MG tablet, Take 1 tablet (20 mg total) by mouth daily., Disp: 90 tablet, Rfl: 3 .  benazepril (LOTENSIN) 40 MG tablet, Take 1 tablet (40 mg total) by mouth daily., Disp: 90 tablet, Rfl: 3 .  Blood Glucose Monitoring Suppl (ONE TOUCH ULTRA 2) W/DEVICE KIT, Use as directed , 1 device  250.02, Disp: 1 each, Rfl: 0 .  cyclobenzaprine (FLEXERIL) 10 MG tablet, One half tab PO qHS, then increase gradually to one tab TID., Disp: 30 tablet, Rfl: 0 .  glipiZIDE (GLIPIZIDE XL) 5 MG 24 hr tablet, Take 1 tablet (5 mg total) by mouth daily with breakfast., Disp: 90 tablet, Rfl: 3 .  glucose blood (ONE TOUCH ULTRA TEST) test strip, 1 each by Other route 2 (two) times daily. Use to check blood sugars twice a day Dx E11.9, Disp: 100 each, Rfl: 5 .  glucose blood test strip, Use as instructed, Disp: 100 each, Rfl: 12 .  hydrochlorothiazide (MICROZIDE) 12.5 MG capsule, Take 1 capsule (12.5 mg total) by mouth daily., Disp: 90 capsule, Rfl: 3 .  Lancets (ONETOUCH ULTRASOFT)  lancets, 1 each by Other route 2 (two) times daily. Use to help check blood sugars twice a day Dx E11.9, Disp: 100 each, Rfl: 5 .  Lancets MISC, Use as directed 1 per day   250.02, Disp: 100 each, Rfl: 12 .  meloxicam (MOBIC) 15 MG tablet, One tab PO qAM with breakfast for 2 weeks, then daily prn pain., Disp: 30 tablet, Rfl: 3 .  metFORMIN (GLUCOPHAGE) 1000 MG tablet, Take 1 tablet (1,000 mg total) by mouth daily with breakfast., Disp: 90 tablet, Rfl: 3 .  sitaGLIPtin (JANUVIA) 100 MG tablet, Take 1 tablet (100 mg total) by mouth daily., Disp: 90 tablet, Rfl: 3 .  Sodium Hyaluronate (SUPARTZ) 25 MG/2.5ML SOSY, Inject weekly into left knee x5 weeks.  Pt to bring syringes to office. Dx primary OA knee,  Disp: 5 Syringe, Rfl: 0  EXAM:  Vitals:   04/20/16 1029  BP: 122/82  Pulse: 88  Temp: 97.8 F (36.6 C)    Body mass index is 40.03 kg/m.  GENERAL: vitals reviewed and listed above, alert, oriented, appears well hydrated and in no acute distress  HEENT: atraumatic, conjunttiva clear, no obvious abnormalities on inspection of external nose and ears  NECK: no obvious masses on inspection  LUNGS: clear to auscultation bilaterally, no wheezes, rales or rhonchi, good air movement  CV: HRRR, no peripheral edema  MS: moves all extremities without noticeable abnormality, she does not have any tenderness to palpation of chest wall today.  PSYCH: pleasant and cooperative, no obvious depression or anxiety  ASSESSMENT AND PLAN:  Discussed the following assessment and plan:  Atypical chest pain - Plan: EKG 12-Lead, Ambulatory referral to Cardiology  Type 2 diabetes mellitus without complication, without long-term current use of insulin ([place]) - Plan: Ambulatory referral to Cardiology  Hyperlipidemia, unspecified hyperlipidemia type - Plan: Ambulatory referral to Cardiology  Essential hypertension - Plan: Ambulatory referral to Cardiology  BMI >= 40 ([place]) - Plan: Ambulatory referral to Cardiology  -we discussed possible serious and likely etiologies, workup and treatment, treatment risks and return precautions; this is atypical chest pain, but in a high-risk patient. We discussed obtaining EKG, chest x-ray and close follow-up with her primary doctor for further testing or referral. However,she would really like to see a cardiologist and requests that I refer her today. She agreed to EKG today which showed NSR without any acute changes from prior. Referral placed. In interim advised trial antacid/heat when symptoms occur to see if helpful and of course, we advised Olive  to return or notify a doctor immediately if symptoms worsen or persist or new concerns arise. We discussed emergency and  return precautions pending her cardiology evaluation.   Patient Instructions  -We placed a referral for you as discussed. It usually takes about 1-2 weeks to process and schedule this referral. If you have not heard from [place] regarding this appointment in 2 weeks please contact our office.  -Seek care immediately if worsening or new symptoms occur.  -please also schedule follow up with your primary doctor if cardiology evaluation normal and you have persistent symptoms.     Colin Benton R., DO

## 2016-04-20 NOTE — Progress Notes (Signed)
Pre visit review using our clinic review tool, if applicable. No additional management support is needed unless otherwise documented below in the visit note. 

## 2016-04-26 ENCOUNTER — Ambulatory Visit (INDEPENDENT_AMBULATORY_CARE_PROVIDER_SITE_OTHER): Payer: BC Managed Care – PPO | Admitting: Physician Assistant

## 2016-04-26 ENCOUNTER — Encounter: Payer: Self-pay | Admitting: Physician Assistant

## 2016-04-26 VITALS — BP 128/79 | HR 72 | Ht 65.0 in | Wt 258.0 lb

## 2016-04-26 DIAGNOSIS — E118 Type 2 diabetes mellitus with unspecified complications: Secondary | ICD-10-CM | POA: Diagnosis not present

## 2016-04-26 DIAGNOSIS — E785 Hyperlipidemia, unspecified: Secondary | ICD-10-CM | POA: Diagnosis not present

## 2016-04-26 DIAGNOSIS — I10 Essential (primary) hypertension: Secondary | ICD-10-CM

## 2016-04-26 DIAGNOSIS — R079 Chest pain, unspecified: Secondary | ICD-10-CM

## 2016-04-26 NOTE — Patient Instructions (Signed)
Medication Instructions:  Continue current medications.   Labwork: none   Testing/Procedures: Your physician has requested that you have an exercise tolerance test. For further information please visit [email or web address]. Please also follow instruction sheet, as given.   Follow-Up: Your physician recommends that you schedule a follow-up appointment in: 4 WEEKS WITH HAO MENG.     If you need a refill on your cardiac medications before your next appointment, please call your pharmacy.

## 2016-04-26 NOTE — Progress Notes (Signed)
Cardiology Office Note    Date:  04/26/2016   ID:  [name], DOB Apr 17, 1961, MRN [id number]  PCP:  Cathlean Cower, MD  Cardiologist:  New  Referred by Colin Benton, DO Reason for referral: chest pain   Chief Complaint  Patient presents with  . New Patient (Initial Visit)    seen with DOD Dr. Ellyn Hack, referred by Dr. Maudie Mercury for chest pain    History of Present Illness:  Jessica Daniels is a 55 y.o. female with PMH of HTN, HLD, DM, obesity, anxiety and depression Who was referred to cardiology office today for evaluation of intermittent chest pain at rest. Based on the previous note in 2009, she had episode of chest pain along with palpitation of the time, she was referred to cardiology office for further evaluation. I am unable to locate any cardiology note. Echocardiogram obtained on 04/18/2008 showed ejection fraction 55%, otherwise there is no significant valvular abnormality nor was there any regional wall motion abnormality. She was recently seen by Dr. Maudie Mercury which time, he complained of some intermittent chest discomfort at rest. She was referred to cardiology office for further evaluation.  According to the patient, despite having history of hypertension, hyperlipidemia and diabetes, she has those risk factor failure well controlled. Her blood pressure is normal in the office. Her recent lab work shows her lipid panel is well within normal limits. She does have some degree of obesity, however she has been trying to increase her activity level in the gym. Past several months, she has been noticing intermittent chest discomfort lasting 1 to 2 minutes each. It is not exacerbated by deep inspiration, body rotation with palpation. She only noticed them when she is laying down. The frequency of the events has not been increasing and appears to be more sporadic. Sometimes she can go for long time without any episode, other times they occurs together. Interestingly enough, the symptom is not  associated with exertion, she was able to exercise on treadmill in the gym a week ago for 45 minutes without any discomfort. She denies any shortness of breath and dizziness recently. Essentially other than the mild chest discomfort, she does not have any other symptoms accompanied with it. She never smoked, her father and mother supposedly had heart issue later in life.   Past Medical History:  Diagnosis Date  . Allergic rhinitis   . Anemia   . Anxiety   . Depression   . Diabetes mellitus   . Hyperlipidemia   . Hypertension     Past Surgical History:  Procedure Laterality Date  . CHOLECYSTECTOMY    . LUMBAR LAMINECTOMY      Current Medications: Outpatient Medications Prior to Visit  Medication Sig Dispense Refill  . amLODipine (NORVASC) 10 MG tablet Take 1 tablet (10 mg total) by mouth daily. Keep sept appt for future refills 30 tablet 0  . atorvastatin (LIPITOR) 20 MG tablet Take 1 tablet (20 mg total) by mouth daily. 90 tablet 3  . benazepril (LOTENSIN) 40 MG tablet Take 1 tablet (40 mg total) by mouth daily. 90 tablet 3  . glipiZIDE (GLIPIZIDE XL) 5 MG 24 hr tablet Take 1 tablet (5 mg total) by mouth daily with breakfast. 90 tablet 3  . metFORMIN (GLUCOPHAGE) 1000 MG tablet Take 1 tablet (1,000 mg total) by mouth daily with breakfast. 90 tablet 3  . sitaGLIPtin (JANUVIA) 100 MG tablet Take 1 tablet (100 mg total) by mouth daily. 90 tablet 3  . Blood Glucose  Monitoring Suppl (ONE TOUCH ULTRA 2) W/DEVICE KIT Use as directed , 1 device  250.02 1 each 0  . cyclobenzaprine (FLEXERIL) 10 MG tablet One half tab PO qHS, then increase gradually to one tab TID. 30 tablet 0  . glucose blood (ONE TOUCH ULTRA TEST) test strip 1 each by Other route 2 (two) times daily. Use to check blood sugars twice a day Dx E11.9 100 each 5  . glucose blood test strip Use as instructed 100 each 12  . hydrochlorothiazide (MICROZIDE) 12.5 MG capsule Take 1 capsule (12.5 mg total) by mouth daily. 90 capsule 3    . Lancets (ONETOUCH ULTRASOFT) lancets 1 each by Other route 2 (two) times daily. Use to help check blood sugars twice a day Dx E11.9 100 each 5  . Lancets MISC Use as directed 1 per day   250.02 100 each 12  . meloxicam (MOBIC) 15 MG tablet One tab PO qAM with breakfast for 2 weeks, then daily prn pain. 30 tablet 3  . Sodium Hyaluronate (SUPARTZ) 25 MG/2.5ML SOSY Inject weekly into left knee x5 weeks.  Pt to bring syringes to office. Dx primary OA knee 5 Syringe 0   No facility-administered medications prior to visit.      Allergies:   Crestor [rosuvastatin]   Social History   Social History  . Marital status: Married    Spouse name: N/A  . Number of children: N/A  . 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 Smoking status: Never Smoker  . Smokeless tobacco: None  . Alcohol use No  . Drug use: No  . Sexual activity: Not Asked   Other Topics Concern  . None   Social History Narrative  . None     Family History:  The patient's family history includes Heart attack in her mother; Lung cancer in her father.   ROS:   Please see the history of present illness.    ROS All other systems reviewed and are negative.   PHYSICAL EXAM:   VS:  BP 128/79   Pulse 72   Ht '5\' 5"'  (1.651 m)   Wt 258 lb (117 kg)   BMI 42.93 kg/m    GEN: Well nourished, well developed, in no acute distress  HEENT: normal  Neck: no JVD, carotid bruits, or masses Cardiac: RRR; no murmurs, rubs, or gallops,no edema  Respiratory:  clear to auscultation bilaterally, normal work of breathing GI: soft, nontender, nondistended, + BS MS: no deformity or atrophy  Skin: warm and dry, no rash Neuro:  Alert and Oriented x 3, Strength and sensation are intact Psych: euthymic mood, full affect  Wt Readings from Last 3 Encounters:  04/26/16 258 lb (117 kg)  04/20/16 255 lb 9.6 oz (115.9 kg)  03/18/16 260 lb (117.9 kg)       Studies/Labs Reviewed:   EKG:  EKG is ordered today.  The ekg ordered today demonstrates Normal sinus rhythm with T-wave inversion in 3 and aVF, V3 and V4. When compared to the previous EKG in 2014, there is no significant change.  Recent Labs: 09/10/2015: Hemoglobin 11.4; Platelets 334.0; TSH 1.71 03/18/2016: ALT 16; BUN 7; Creatinine, Ser 0.93; Potassium 4.0; Sodium 139   Lipid Panel    Component Value Date/Time   CHOL 149 03/18/2016 1145   TRIG 134.0 03/18/2016 1145   HDL 56.70 03/18/2016 1145   CHOLHDL 3 03/18/2016  1145   VLDL 26.8 03/18/2016 1145   LDLCALC 65 03/18/2016 1145   LDLDIRECT 137.4 10/20/2009 1615    Additional studies/ records that were reviewed today include:   Echo 04/18/2008 SUMMARY - Overall left ventricular systolic function was normal. Left    ventricular ejection fraction was estimated to be 55 %. There    were no left ventricular regional wall motion abnormalities. - The left atrium was mildly dilated.   ASSESSMENT:    1. Chest pain, unspecified type   2. Essential hypertension   3. Hyperlipidemia, unspecified hyperlipidemia type   4. Controlled type 2 diabetes mellitus with complication, without long-term current use of insulin (H[facility])      PLAN:  In order of problems listed above:  1. Atypical chest pain: Does not occur with exertion, was able to exercise for 45 minutes last week without any problem. It tends to occur at night when she is laying in bed, lasting 1 to 2 minutes each time. The frequency also does not seems to be increasing. Her chest pain is certainly atypical, we will obtain POET. If negative, no further workup is needed. On EKG, she has baseline T-wave inversion in lead 3, aVF, V3 and V4 which is unchanged since 2014.  2. HTN: Well-controlled on benazepril and amlodipine.  3. HLD: Continue Lipitor. Recent lipid panel obtained in September 2017 showed total cholesterol, triglycerides, LDL and HDL all within normal  limits.  4. DM II: On Glucophage     Medication Adjustments/Labs and Tests Ordered: Current medicines are reviewed at length with the patient today.  Concerns regarding medicines are outlined above.  Medication changes, Labs and Tests ordered today are listed in the Patient Instructions below. Patient Instructions  Medication Instructions:  Continue current medications.   Labwork: none   Testing/Procedures: Your physician has requested that you have an exercise tolerance test. For further information please visit [email or web address]. Please also follow instruction sheet, as given.   Follow-Up: Your physician recommends that you schedule a follow-up appointment in: [place].     If you need a refill on your cardiac medications before your next appointment, please call your pharmacy.      Hilbert Corrigan, [place]  04/26/2016 12:56 PM    [address], [place], [place]  [address] Phone: [phone]; Fax: [phone]

## 2016-04-28 ENCOUNTER — Telehealth (HOSPITAL_COMMUNITY): Payer: Self-pay

## 2016-04-28 NOTE — Telephone Encounter (Signed)
Encounter complete. 

## 2016-04-30 ENCOUNTER — Ambulatory Visit (HOSPITAL_COMMUNITY)
Admission: RE | Admit: 2016-04-30 | Discharge: 2016-04-30 | Disposition: A | Discharge status: Home / Self Care | Payer: BC Managed Care – PPO | Source: Ambulatory Visit | Attending: Cardiovascular Disease | Admitting: Cardiovascular Disease

## 2016-04-30 DIAGNOSIS — R079 Chest pain, unspecified: Secondary | ICD-10-CM | POA: Insufficient documentation

## 2016-04-30 LAB — EXERCISE TOLERANCE TEST
CHL CUP MPHR: 167 {beats}/min
CHL CUP RESTING HR STRESS: 89 {beats}/min
CSEPEDS: 32 s
CSEPEW: 7 METS
CSEPPHR: 155 {beats}/min
Exercise duration (min): 5 min
Percent HR: 92 %
RPE: 18

## 2016-05-11 ENCOUNTER — Other Ambulatory Visit: Payer: Self-pay | Admitting: *Deleted

## 2016-05-11 MED ORDER — AMLODIPINE BESYLATE 10 MG PO TABS: 10.0000 mg | ORAL_TABLET | Freq: Every day | ORAL | 1 refills | Status: DC

## 2016-05-20 ENCOUNTER — Other Ambulatory Visit: Payer: Self-pay | Admitting: *Deleted

## 2016-05-20 MED ORDER — SITAGLIPTIN PHOSPHATE 100 MG PO TABS: 100.0000 mg | ORAL_TABLET | Freq: Every day | ORAL | 2 refills | Status: DC

## 2016-05-20 NOTE — Telephone Encounter (Signed)
Left msg on triage needing refill on her Januvia. Per chart MD ok rx back in Sept will resend to [facility]...Raechel Chute/lmb

## 2016-05-24 ENCOUNTER — Ambulatory Visit: Payer: BC Managed Care – PPO | Admitting: Physician Assistant

## 2016-07-06 ENCOUNTER — Ambulatory Visit (INDEPENDENT_AMBULATORY_CARE_PROVIDER_SITE_OTHER): Payer: BC Managed Care – PPO | Admitting: Internal Medicine

## 2016-07-06 ENCOUNTER — Telehealth: Payer: Self-pay

## 2016-07-06 VITALS — BP 140/80 | HR 76 | Temp 99.8°F | Resp 20 | Wt 254.0 lb

## 2016-07-06 DIAGNOSIS — R05 Cough: Secondary | ICD-10-CM | POA: Diagnosis not present

## 2016-07-06 DIAGNOSIS — I10 Essential (primary) hypertension: Secondary | ICD-10-CM | POA: Diagnosis not present

## 2016-07-06 DIAGNOSIS — E119 Type 2 diabetes mellitus without complications: Secondary | ICD-10-CM | POA: Diagnosis not present

## 2016-07-06 DIAGNOSIS — R059 Cough, unspecified: Secondary | ICD-10-CM

## 2016-07-06 MED ORDER — AZITHROMYCIN 250 MG PO TABS: ORAL_TABLET | ORAL | 1 refills | Status: DC

## 2016-07-06 MED ORDER — HYDROCODONE-HOMATROPINE 5-1.5 MG/5ML PO SYRP: 5.0000 mL | ORAL_SOLUTION | Freq: Four times a day (QID) | ORAL | 0 refills | Status: AC | PRN

## 2016-07-06 NOTE — Assessment & Plan Note (Signed)
stable overall by history and exam, recent data reviewed with pt, and pt to continue medical treatment as before,  to f/u any worsening symptoms or concerns BP Readings from Last 3 Encounters:  07/06/16 140/80  04/26/16 128/79  04/20/16 122/82

## 2016-07-06 NOTE — Progress Notes (Signed)
Letter done

## 2016-07-06 NOTE — Telephone Encounter (Signed)
A user error has taken place.

## 2016-07-06 NOTE — Progress Notes (Signed)
Pre visit review using our clinic review tool, if applicable. No additional management support is needed unless otherwise documented below in the visit note. 

## 2016-07-06 NOTE — Assessment & Plan Note (Signed)
Mild to mod, c/w bronchitis vs pna, declines cxr, for antibx course,  Cough med prn, to f/u any worsening symptoms or concerns 

## 2016-07-06 NOTE — Assessment & Plan Note (Signed)
stable overall by history and exam, recent data reviewed with pt, and pt to continue medical treatment as before,  to f/u any worsening symptoms or concerns Lab Results  Component Value Date   HGBA1C 6.6 (H) 03/18/2016    

## 2016-07-06 NOTE — Progress Notes (Signed)
Subjective:    Patient ID: Jessica Daniels, female    DOB: 02-17-1961, 56 y.o.   MRN: [id number]  HPI  Here with acute onset mild to mod 2-3 days ST, HA, general weakness and malaise, with prod cough greenish sputum, but Pt denies chest pain, increased sob or doe, wheezing, orthopnea, PND, increased LE swelling, palpitations, dizziness or syncope.  Pt denies new neurological symptoms such as new headache, or facial or extremity weakness or numbness   Pt denies polydipsia, polyuria Past Medical History:  Diagnosis Date  . Allergic rhinitis   . Anemia   . Anxiety   . Depression   . Diabetes mellitus   . Hyperlipidemia   . Hypertension    Past Surgical History:  Procedure Laterality Date  . CHOLECYSTECTOMY    . LUMBAR LAMINECTOMY      reports that she has never smoked. She does not have any smokeless tobacco history on file. She reports that she does not drink alcohol or use drugs. family history includes Heart attack in her mother; Lung cancer in her father. Allergies  Allergen Reactions  . Crestor [Rosuvastatin]     Elevated liver enzymes   Current Outpatient Prescriptions on File Prior to Visit  Medication Sig Dispense Refill  . amLODipine (NORVASC) 10 MG tablet Take 1 tablet (10 mg total) by mouth daily. 90 tablet 1  . atorvastatin (LIPITOR) 20 MG tablet Take 1 tablet (20 mg total) by mouth daily. 90 tablet 3  . benazepril (LOTENSIN) 40 MG tablet Take 1 tablet (40 mg total) by mouth daily. 90 tablet 3  . glipiZIDE (GLIPIZIDE XL) 5 MG 24 hr tablet Take 1 tablet (5 mg total) by mouth daily with breakfast. 90 tablet 3  . metFORMIN (GLUCOPHAGE) 1000 MG tablet Take 1 tablet (1,000 mg total) by mouth daily with breakfast. 90 tablet 3  . sitaGLIPtin (JANUVIA) 100 MG tablet Take 1 tablet (100 mg total) by mouth daily. 90 tablet 2   No current facility-administered medications on file prior to visit.    Review of Systems  Constitutional: Negative for unusual diaphoresis or night  sweats HENT: Negative for ear swelling or discharge Eyes: Negative for worsening visual haziness  Respiratory: Negative for choking and stridor.   Gastrointestinal: Negative for distension or worsening eructation Genitourinary: Negative for retention or change in urine volume.  Musculoskeletal: Negative for other MSK pain or swelling Skin: Negative for color change and worsening wound Neurological: Negative for tremors and numbness other than noted  Psychiatric/Behavioral: Negative for decreased concentration or agitation other than above   All other system neg per pt    Objective:   Physical Exam BP 140/80   Pulse 76   Temp 99.8 F (37.7 C) (Oral)   Resp 20   Wt 254 lb (115.2 kg)   SpO2 92%   BMI 42.27 kg/m  VS noted, mild ill Constitutional: Pt appears in no apparent distress HENT: Head: NCAT.  Right Ear: External ear normal.  Left Ear: External ear normal.  Eyes: . Pupils are equal, round, and reactive to light. Conjunctivae and EOM are normal Bilat tm's with mild erythema.  Max sinus areas non tender.  Pharynx with mild erythema, no exudate Neck: Normal range of motion. Neck supple.  Cardiovascular: Normal rate and regular rhythm.   Pulmonary/Chest: Effort normal and breath sounds decreased bilat without rales or wheezing.  Neurological: Pt is alert. Not confused , motor grossly intact Skin: Skin is warm. No rash, no LE edema Psychiatric: Pt  behavior is normal. No agitation.  No other new exam findings    Assessment & Plan:

## 2016-07-06 NOTE — Patient Instructions (Addendum)
Please take all new medication as prescribed - the antibiotic, and cough medicine if needed  Please continue all other medications as before, and refills have been done if requested.  Please have the pharmacy call with any other refills you may need.  Please keep your appointments with your specialists as you may have planned  You are given the work note

## 2016-07-13 ENCOUNTER — Encounter: Payer: Self-pay | Admitting: Internal Medicine

## 2016-07-13 ENCOUNTER — Ambulatory Visit (INDEPENDENT_AMBULATORY_CARE_PROVIDER_SITE_OTHER): Payer: BC Managed Care – PPO | Admitting: Internal Medicine

## 2016-07-13 VITALS — BP 140/80 | HR 76 | Temp 98.1°F | Resp 20 | Wt 253.0 lb

## 2016-07-13 DIAGNOSIS — R062 Wheezing: Secondary | ICD-10-CM | POA: Diagnosis not present

## 2016-07-13 DIAGNOSIS — I10 Essential (primary) hypertension: Secondary | ICD-10-CM | POA: Diagnosis not present

## 2016-07-13 DIAGNOSIS — R05 Cough: Secondary | ICD-10-CM

## 2016-07-13 DIAGNOSIS — R059 Cough, unspecified: Secondary | ICD-10-CM

## 2016-07-13 MED ORDER — LEVOFLOXACIN 500 MG PO TABS: 500.0000 mg | ORAL_TABLET | Freq: Every day | ORAL | 0 refills | Status: AC

## 2016-07-13 MED ORDER — PREDNISONE 10 MG PO TABS: ORAL_TABLET | ORAL | 0 refills | Status: DC

## 2016-07-13 NOTE — Assessment & Plan Note (Signed)
Mild to mod, for predpac asd course,  to f/u any worsening symptoms or concerns 

## 2016-07-13 NOTE — Progress Notes (Signed)
Letter done

## 2016-07-13 NOTE — Assessment & Plan Note (Signed)
stable overall by history and exam, recent data reviewed with pt, and pt to continue medical treatment as before,  to f/u any worsening symptoms or concerns' BP Readings from Last 3 Encounters:  07/13/16 140/80  07/06/16 140/80  04/26/16 128/79

## 2016-07-13 NOTE — Progress Notes (Signed)
Pre visit review using our clinic review tool, if applicable. No additional management support is needed unless otherwise documented below in the visit note. 

## 2016-07-13 NOTE — Progress Notes (Signed)
Subjective:    Patient ID: Jessica Daniels, female    DOB: 10/20/1960, 56 y.o.   MRN: [id number]  HPI  Here after initially doing well for a few days with last visit tx for upper resp infecitous illness, but now Here with acute onset mild to mod 2-3 days ST, HA, general weakness and malaise, mild wheezing and sob, with prod cough greenish sputum, but Pt denies chest pain, orthopnea, PND, increased LE swelling, palpitations, dizziness or syncope. Pt denies new neurological symptoms such as new headache, or facial or extremity weakness or numbness   Pt denies polydipsia, polyuria Past Medical History:  Diagnosis Date  . Allergic rhinitis   . Anemia   . Anxiety   . Depression   . Diabetes mellitus   . Hyperlipidemia   . Hypertension    Past Surgical History:  Procedure Laterality Date  . CHOLECYSTECTOMY    . LUMBAR LAMINECTOMY      reports that she has never smoked. She does not have any smokeless tobacco history on file. She reports that she does not drink alcohol or use drugs. family history includes Heart attack in her mother; Lung cancer in her father. Allergies  Allergen Reactions  . Crestor [Rosuvastatin]     Elevated liver enzymes   Current Outpatient Prescriptions on File Prior to Visit  Medication Sig Dispense Refill  . amLODipine (NORVASC) 10 MG tablet Take 1 tablet (10 mg total) by mouth daily. 90 tablet 1  . atorvastatin (LIPITOR) 20 MG tablet Take 1 tablet (20 mg total) by mouth daily. 90 tablet 3  . benazepril (LOTENSIN) 40 MG tablet Take 1 tablet (40 mg total) by mouth daily. 90 tablet 3  . glipiZIDE (GLIPIZIDE XL) 5 MG 24 hr tablet Take 1 tablet (5 mg total) by mouth daily with breakfast. 90 tablet 3  . HYDROcodone-homatropine (HYCODAN) 5-1.5 MG/5ML syrup Take 5 mLs by mouth every 6 (six) hours as needed for cough. 180 mL 0  . metFORMIN (GLUCOPHAGE) 1000 MG tablet Take 1 tablet (1,000 mg total) by mouth daily with breakfast. 90 tablet 3  . sitaGLIPtin (JANUVIA) 100  MG tablet Take 1 tablet (100 mg total) by mouth daily. 90 tablet 2   No current facility-administered medications on file prior to visit.    Review of Systems  Constitutional: Negative for unusual diaphoresis or night sweats HENT: Negative for ear swelling or discharge Eyes: Negative for worsening visual haziness  Respiratory: Negative for choking and stridor.   Gastrointestinal: Negative for distension or worsening eructation Genitourinary: Negative for retention or change in urine volume.  Musculoskeletal: Negative for other MSK pain or swelling Skin: Negative for color change and worsening wound Neurological: Negative for tremors and numbness other than noted  Psychiatric/Behavioral: Negative for decreased concentration or agitation other than above   All other system neg per pt    Objective:   Physical Exam BP 140/80   Pulse 76   Temp 98.1 F (36.7 C) (Oral)   Resp 20   Wt 253 lb (114.8 kg)   SpO2 95%   BMI 42.10 kg/m  VS noted, mild ill Constitutional: Pt appears in no apparent distress HENT: Head: NCAT.  Right Ear: External ear normal.  Left Ear: External ear normal.  Eyes: . Pupils are equal, round, and reactive to light. Conjunctivae and EOM are normal Bilat tm's with mild erythema.  Max sinus areas none tender.  Pharynx with mild erythema, no exudate Neck: Normal range of motion. Neck supple. with bilat  submandib LA Cardiovascular: Normal rate and regular rhythm.   Pulmonary/Chest: Effort normal and breath sounds decreased without rales but with few scattered wheezing.  Neurological: Pt is alert. Not confused , motor grossly intact Skin: Skin is warm. No rash, no LE edema Psychiatric: Pt behavior is normal. No agitation.  No other new exam findings    Assessment & Plan:

## 2016-07-13 NOTE — Assessment & Plan Note (Signed)
Mild to mod, declines cxr, c/w bronchitis vs pna, for antibx course,  Cough med prn, to f/u any worsening symptoms or concerns

## 2016-07-13 NOTE — Patient Instructions (Signed)
Please take all new medication as prescribed - the antibiotic, and prednisone  Please continue all other medications as before, including the cough medicine if needed  Please have the pharmacy call with any other refills you may need.  Please keep your appointments with your specialists as you may have planned

## 2016-09-15 ENCOUNTER — Ambulatory Visit: Payer: BC Managed Care – PPO | Admitting: Internal Medicine

## 2016-10-13 ENCOUNTER — Ambulatory Visit (INDEPENDENT_AMBULATORY_CARE_PROVIDER_SITE_OTHER): Payer: BC Managed Care – PPO | Admitting: Internal Medicine

## 2016-10-13 ENCOUNTER — Encounter: Payer: Self-pay | Admitting: Internal Medicine

## 2016-10-13 VITALS — BP 124/80 | HR 76 | Temp 97.9°F | Ht 67.0 in | Wt 245.0 lb

## 2016-10-13 DIAGNOSIS — Z0000 Encounter for general adult medical examination without abnormal findings: Secondary | ICD-10-CM | POA: Diagnosis not present

## 2016-10-13 DIAGNOSIS — E119 Type 2 diabetes mellitus without complications: Secondary | ICD-10-CM | POA: Diagnosis not present

## 2016-10-13 MED ORDER — GLIPIZIDE ER 5 MG PO TB24: 5.0000 mg | ORAL_TABLET | Freq: Every day | ORAL | 3 refills | Status: DC

## 2016-10-13 MED ORDER — ASPIRIN EC 81 MG PO TBEC: 81.0000 mg | DELAYED_RELEASE_TABLET | Freq: Every day | ORAL | 11 refills | Status: DC

## 2016-10-13 MED ORDER — METFORMIN HCL 1000 MG PO TABS: 1000.0000 mg | ORAL_TABLET | Freq: Every day | ORAL | 3 refills | Status: DC

## 2016-10-13 MED ORDER — AMLODIPINE BESYLATE 10 MG PO TABS: 10.0000 mg | ORAL_TABLET | Freq: Every day | ORAL | 1 refills | Status: DC

## 2016-10-13 MED ORDER — BENAZEPRIL HCL 40 MG PO TABS: 40.0000 mg | ORAL_TABLET | Freq: Every day | ORAL | 3 refills | Status: DC

## 2016-10-13 MED ORDER — ATORVASTATIN CALCIUM 20 MG PO TABS: 20.0000 mg | ORAL_TABLET | Freq: Every day | ORAL | 3 refills | Status: DC

## 2016-10-13 MED ORDER — SITAGLIPTIN PHOSPHATE 100 MG PO TABS: 100.0000 mg | ORAL_TABLET | Freq: Every day | ORAL | 2 refills | Status: DC

## 2016-10-13 NOTE — Assessment & Plan Note (Signed)
Overall doing well, age appropriate education and counseling updated, referrals for preventative services and immunizations addressed, dietary and smoking counseling addressed, most recent labs reviewed.  I have personally reviewed and have noted:  1) the patient's medical and social history 2) The pt's use of alcohol, tobacco, and illicit drugs 3) The patient's current medications and supplements 4) Functional ability including ADL's, fall risk, home safety risk, hearing and visual impairment 5) Diet and physical activities 6) Evidence for depression or mood disorder 7) The patient's height, weight, and BMI have been recorded in the chart  I have made referrals, and provided counseling and education based on review of the above Pt to call if changes her mind on immunizations and colonscopy To also start asa 81 mg per day

## 2016-10-13 NOTE — Progress Notes (Signed)
Pre visit review using our clinic review tool, if applicable. No additional management support is needed unless otherwise documented below in the visit note. 

## 2016-10-13 NOTE — Assessment & Plan Note (Signed)
.  stable overall by history and exam, recent data reviewed with pt, and pt to continue medical treatment as before,  to f/u any worsening symptoms or concerns Lab Results  Component Value Date   HGBA1C 6.6 (H) 03/18/2016   For f/u lab

## 2016-10-13 NOTE — Progress Notes (Signed)
Subjective:    Patient ID: Jessica Daniels, female    DOB: 13-Jan-1961, 56 y.o.   MRN: [id number]  HPI  Here for wellness and f/u;  Overall doing ok;  Pt denies Chest pain, worsening SOB, DOE, wheezing, orthopnea, PND, worsening LE edema, palpitations, dizziness or syncope.  Pt denies neurological change such as new headache, facial or extremity weakness.  Pt denies polydipsia, polyuria, or low sugar symptoms. Pt states overall good compliance with treatment and medications, good tolerability, and has been trying to follow appropriate diet.  Pt denies worsening depressive symptoms, suicidal ideation or panic. No fever, night sweats, loss of appetite, or other constitutional symptoms.  Pt states good ability with ADL's, has low fall risk, home safety reviewed and adequate, no other significant changes in hearing or vision, and only occasionally active with exercise.  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 Has been able to lose 8 lbs in last few months with better diet.  Does mention has intermittent minor swelling to right leg in the PM, better in the AM Wt Readings from Last 3 Encounters:  10/13/16 245 lb (111.1 kg)  07/13/16 253 lb (114.8 kg)  07/06/16 254 lb (115.2 kg)  Due for GYn routine care f/u later this year, mammogram as well. DEclines all immunizations, declines colonoscopy Past Medical History:  Diagnosis Date  . Allergic rhinitis   . Anemia   . Anxiety   . Depression   . Diabetes mellitus   . Hyperlipidemia   . Hypertension    Past Surgical History:  Procedure Laterality Date  . CHOLECYSTECTOMY    . LUMBAR LAMINECTOMY      reports that she has never smoked. She has never used smokeless tobacco. She reports that she does not drink alcohol or use drugs. family history includes Heart attack in her mother; Lung cancer in her father. Allergies  Allergen Reactions  . Crestor [Rosuvastatin]     Elevated liver enzymes   Current Outpatient Prescriptions on File Prior to Visit    Medication Sig Dispense Refill  . amLODipine (NORVASC) 10 MG tablet Take 1 tablet (10 mg total) by mouth daily. 90 tablet 1  . atorvastatin (LIPITOR) 20 MG tablet Take 1 tablet (20 mg total) by mouth daily. 90 tablet 3  . benazepril (LOTENSIN) 40 MG tablet Take 1 tablet (40 mg total) by mouth daily. 90 tablet 3  . glipiZIDE (GLIPIZIDE XL) 5 MG 24 hr tablet Take 1 tablet (5 mg total) by mouth daily with breakfast. 90 tablet 3  . metFORMIN (GLUCOPHAGE) 1000 MG tablet Take 1 tablet (1,000 mg total) by mouth daily with breakfast. 90 tablet 3  . sitaGLIPtin (JANUVIA) 100 MG tablet Take 1 tablet (100 mg total) by mouth daily. 90 tablet 2   No current facility-administered medications on file prior to visit.      Review of Systems Constitutional: Negative for other unusual diaphoresis, sweats, appetite or weight changes HENT: Negative for other worsening hearing loss, ear pain, facial swelling, mouth sores or neck stiffness.   Eyes: Negative for other worsening pain, redness or other visual disturbance.  Respiratory: Negative for other stridor or swelling Cardiovascular: Negative for other palpitations or other chest pain  Gastrointestinal: Negative for worsening diarrhea or loose stools, blood in stool, distention or other pain Genitourinary: Negative for hematuria, flank pain or other change in urine volume.  Musculoskeletal: Negative for myalgias or other joint swelling.  Skin: Negative for other color change, or other wound or worsening drainage.  Neurological: Negative  for other syncope or numbness. Hematological: Negative for other adenopathy or swelling Psychiatric/Behavioral: Negative for hallucinations, other worsening agitation, SI, self-injury, or new decreased concentration All other systenm neg per pt    Objective:   Physical Exam VS noted,  Constitutional: Pt is oriented to person, place, and time. Appears well-developed and well-nourished, in no significant distress and  comfortable Head: Normocephalic and atraumatic  Eyes: Conjunctivae and EOM are normal. Pupils are equal, round, and reactive to light Right Ear: External ear normal without discharge Left Ear: External ear normal without discharge Nose: Nose without discharge or deformity Mouth/Throat: Oropharynx is without other ulcerations and moist  Neck: Normal range of motion. Neck supple. No JVD present. No tracheal deviation present or significant neck LA or mass Cardiovascular: Normal rate, regular rhythm, normal heart sounds and intact distal pulses.   Pulmonary/Chest: WOB normal and breath sounds without rales or wheezing  Abdominal: Soft. Bowel sounds are normal. NT. No HSM  Musculoskeletal: Normal range of motion. Exhibits no edema Lymphadenopathy: Has no other cervical adenopathy.  Neurological: Pt is alert and oriented to person, place, and time. Pt has normal reflexes. No cranial nerve deficit. Motor grossly intact, Gait intact Skin: Skin is warm and dry. No rash noted or new ulcerations Psychiatric:  Has normal mood and affect. Behavior is normal without agitation No other exam findings Lab Results  Component Value Date   WBC 5.3 09/10/2015   HGB 11.4 (L) 09/10/2015   HCT 35.9 (L) 09/10/2015   PLT 334.0 09/10/2015   GLUCOSE 113 (H) 03/18/2016   CHOL 149 03/18/2016   TRIG 134.0 03/18/2016   HDL 56.70 03/18/2016   LDLDIRECT 137.4 10/20/2009   LDLCALC 65 03/18/2016   ALT 16 03/18/2016   AST 19 03/18/2016   NA 139 03/18/2016   K 4.0 03/18/2016   CL 102 03/18/2016   CREATININE 0.93 03/18/2016   BUN 7 03/18/2016   CO2 30 03/18/2016   TSH 1.71 09/10/2015   HGBA1C 6.6 (H) 03/18/2016   MICROALBUR 2.3 (H) 09/10/2015          Assessment & Plan:

## 2016-10-13 NOTE — Patient Instructions (Addendum)
Please start Aspirin 81 mg - 1 per day for prevention of heart disease and stroke  Please continue all other medications as before, and refills have been done if requested.  Please have the pharmacy call with any other refills you may need.  Please continue your efforts at being more active, low cholesterol diet, and weight control.  You are otherwise up to date with prevention measures today.  Please keep your appointments with your specialists as you may have planned  .[email or web address]  You will be contacted by phone if any changes need to be made immediately.  Otherwise, you will receive a letter about your results with an explanation, but please check with MyChart first.  Please remember to sign up for MyChart if you have not done so, as this will be important to you in the future with finding out test results, communicating by private email, and scheduling acute appointments online when needed.  Please return in 6 months, or sooner if needed, with Lab testing done 3-5 days before

## 2017-04-14 ENCOUNTER — Encounter: Payer: Self-pay | Admitting: Internal Medicine

## 2017-04-14 ENCOUNTER — Other Ambulatory Visit (INDEPENDENT_AMBULATORY_CARE_PROVIDER_SITE_OTHER): Payer: BC Managed Care – PPO

## 2017-04-14 ENCOUNTER — Ambulatory Visit (INDEPENDENT_AMBULATORY_CARE_PROVIDER_SITE_OTHER): Payer: BC Managed Care – PPO | Admitting: Internal Medicine

## 2017-04-14 VITALS — BP 126/88 | HR 78 | Temp 98.0°F | Ht 67.0 in | Wt 249.0 lb

## 2017-04-14 DIAGNOSIS — E785 Hyperlipidemia, unspecified: Secondary | ICD-10-CM

## 2017-04-14 DIAGNOSIS — E119 Type 2 diabetes mellitus without complications: Secondary | ICD-10-CM

## 2017-04-14 DIAGNOSIS — I10 Essential (primary) hypertension: Secondary | ICD-10-CM | POA: Diagnosis not present

## 2017-04-14 DIAGNOSIS — Z0000 Encounter for general adult medical examination without abnormal findings: Secondary | ICD-10-CM | POA: Diagnosis not present

## 2017-04-14 DIAGNOSIS — Z114 Encounter for screening for human immunodeficiency virus [HIV]: Secondary | ICD-10-CM

## 2017-04-14 LAB — BASIC METABOLIC PANEL
BUN: 10 mg/dL (ref 6–23)
CHLORIDE: 103 meq/L (ref 96–112)
CO2: 29 mEq/L (ref 19–32)
Calcium: 9.6 mg/dL (ref 8.4–10.5)
Creatinine, Ser: 0.96 mg/dL (ref 0.40–1.20)
GFR: 77.74 mL/min (ref >60.00)
Glucose, Bld: 178 mg/dL — ABNORMAL HIGH (ref 70–99)
POTASSIUM: 3.7 meq/L (ref 3.5–5.1)
SODIUM: 140 meq/L (ref 135–145)

## 2017-04-14 LAB — HEPATIC FUNCTION PANEL
ALT: 20 U/L (ref 0–35)
AST: 16 U/L (ref 0–37)
Albumin: 4.5 g/dL (ref 3.5–5.2)
Alkaline Phosphatase: 118 U/L — ABNORMAL HIGH (ref 39–117)
BILIRUBIN DIRECT: 0 mg/dL (ref 0.0–0.3)
BILIRUBIN TOTAL: 0.4 mg/dL (ref 0.2–1.2)
Total Protein: 7.9 g/dL (ref 6.0–8.3)

## 2017-04-14 LAB — LIPID PANEL
CHOL/HDL RATIO: 4
Cholesterol: 154 mg/dL (ref 0–200)
HDL: 42.8 mg/dL (ref >39.00)
LDL Cholesterol: 85 mg/dL (ref 0–99)
NONHDL: 111.59
Triglycerides: 132 mg/dL (ref 0.0–149.0)
VLDL: 26.4 mg/dL (ref 0.0–40.0)

## 2017-04-14 LAB — HEMOGLOBIN A1C: Hgb A1c MFr Bld: 8.4 % — ABNORMAL HIGH (ref 4.6–6.5)

## 2017-04-14 MED ORDER — AMLODIPINE BESYLATE 10 MG PO TABS: 10.0000 mg | ORAL_TABLET | Freq: Every day | ORAL | 3 refills | Status: DC

## 2017-04-14 MED ORDER — BENAZEPRIL HCL 40 MG PO TABS: 40.0000 mg | ORAL_TABLET | Freq: Every day | ORAL | 3 refills | Status: DC

## 2017-04-14 MED ORDER — METFORMIN HCL 1000 MG PO TABS
1000.0000 mg | ORAL_TABLET | Freq: Every day | ORAL | 3 refills | Status: DC
Start: 2017-04-14 — End: 2017-10-13

## 2017-04-14 MED ORDER — SITAGLIPTIN PHOSPHATE 100 MG PO TABS: 100.0000 mg | ORAL_TABLET | Freq: Every day | ORAL | 3 refills | Status: DC

## 2017-04-14 MED ORDER — ATORVASTATIN CALCIUM 20 MG PO TABS: 20.0000 mg | ORAL_TABLET | Freq: Every day | ORAL | 3 refills | Status: DC

## 2017-04-14 MED ORDER — GLIPIZIDE ER 5 MG PO TB24: 5.0000 mg | ORAL_TABLET | Freq: Every day | ORAL | 3 refills | Status: DC

## 2017-04-14 MED ORDER — GLIPIZIDE ER 2.5 MG PO TB24: 2.5000 mg | ORAL_TABLET | Freq: Every day | ORAL | 3 refills | Status: DC

## 2017-04-14 NOTE — Assessment & Plan Note (Signed)
stable overall by history and exam, recent data reviewed with pt, and pt to continue medical treatment as before,  to f/u any worsening symptoms or concerns BP Readings from Last 3 Encounters:  04/14/17 126/88  10/13/16 124/80  07/13/16 140/80

## 2017-04-14 NOTE — Assessment & Plan Note (Signed)
?   Mild overcontrolled, ok to decrease the glipizide ER to 2.5 qd, cont all other meds, cont to monitor cbgs, work on diet and wt control

## 2017-04-14 NOTE — Assessment & Plan Note (Signed)
Lab Results  Component Value Date   LDLCALC 85 04/14/2017  stable overall by history and exam, recent data reviewed with pt, and pt to continue medical treatment as before,  to f/u any worsening symptoms or concerns

## 2017-04-14 NOTE — Patient Instructions (Signed)
Ok to decrease the Glipziide ER to 2.5 mg per day  Please continue all other medications as before, and refills have been done if requested.  Please have the pharmacy call with any other refills you may need.  Please continue your efforts at being more active, low cholesterol diet, and weight control.  Please keep your appointments with your specialists as you may have planned  Please go to the LAB in the Basement (turn left off the elevator) for the tests to be done today  You will be contacted by phone if any changes need to be made immediately.  Otherwise, you will receive a letter about your results with an explanation, but please check with MyChart first.  Please remember to sign up for MyChart if you have not done so, as this will be important to you in the future with finding out test results, communicating by private email, and scheduling acute appointments online when needed.  Please return in 6 months, or sooner if needed, with Lab testing done 3-5 days before

## 2017-04-14 NOTE — Progress Notes (Signed)
   Subjective:    Patient ID: Jessica Daniels, female    DOB: 1960-12-22, 56 y.o.   MRN: [id number]  HPI  Here to f/u; overall doing ok,  Pt denies chest pain, increasing sob or doe, wheezing, orthopnea, PND, increased LE swelling, palpitations, dizziness or syncope.  Pt denies new neurological symptoms such as new headache, or facial or extremity weakness or numbness.  Pt denies polydipsia, polyuria, but has had more than a few AM low sugar episodes that seemed to occur more after taking the glipizide with other meds.  Pt states overall good compliance with meds, mostly trying to follow appropriate diet, with wt overall stable,  but little exercise however Wt Readings from Last 3 Encounters:  04/14/17 249 lb (112.9 kg)  10/13/16 245 lb (111.1 kg)  07/13/16 253 lb (114.8 kg)   BP Readings from Last 3 Encounters:  04/14/17 126/88  10/13/16 124/80  07/13/16 140/80  Declines flu shot or other immunizations, has appt with GYN in Nov. Past Medical History:  Diagnosis Date  . Allergic rhinitis   . Anemia   . Anxiety   . Depression   . Diabetes mellitus   . Hyperlipidemia   . Hypertension    Past Surgical History:  Procedure Laterality Date  . CHOLECYSTECTOMY    . LUMBAR LAMINECTOMY      reports that she has never smoked. She has never used smokeless tobacco. She reports that she does not drink alcohol or use drugs. family history includes Diabetes in her unknown relative; Heart attack in her mother; Lung cancer in her father. Allergies  Allergen Reactions  . Crestor [Rosuvastatin]     Elevated liver enzymes   Current Outpatient Prescriptions on File Prior to Visit  Medication Sig Dispense Refill  . aspirin EC 81 MG tablet Take 1 tablet (81 mg total) by mouth daily. 90 tablet 11   No current facility-administered medications on file prior to visit.    Review of Systems  Constitutional: Negative for other unusual diaphoresis or sweats HENT: Negative for ear discharge or  swelling Eyes: Negative for other worsening visual disturbances Respiratory: Negative for stridor or other swelling  Gastrointestinal: Negative for worsening distension or other blood Genitourinary: Negative for retention or other urinary change Musculoskeletal: Negative for other MSK pain or swelling Skin: Negative for color change or other new lesions Neurological: Negative for worsening tremors and other numbness  Psychiatric/Behavioral: Negative for worsening agitation or other fatigue All other system neg per pt    Objective:   Physical Exam BP 126/88   Pulse 78   Temp 98 F (36.7 C) (Oral)   Ht  (1.702 m)   Wt 249 lb (112.9 kg)   LMP 08/06/2015   SpO2 99%   BMI 39.00 kg/m  VS noted,  Constitutional: Pt appears in NAD HENT: Head: NCAT.  Right Ear: External ear normal.  Left Ear: External ear normal.  Eyes: . Pupils are equal, round, and reactive to light. Conjunctivae and EOM are normal Nose: without d/c or deformity Neck: Neck supple. Gross normal ROM Cardiovascular: Normal rate and regular rhythm.   Pulmonary/Chest: Effort normal and breath sounds without rales or wheezing.  Neurological: Pt is alert. At baseline orientation, motor grossly intact Skin: Skin is warm. No rashes, other new lesions, no LE edema Psychiatric: Pt behavior is normal without agitation  No other exam findings    Assessment & Plan:

## 2017-06-03 ENCOUNTER — Ambulatory Visit: Payer: BC Managed Care – PPO | Admitting: Internal Medicine

## 2017-06-03 ENCOUNTER — Encounter: Payer: Self-pay | Admitting: Internal Medicine

## 2017-06-03 ENCOUNTER — Encounter: Payer: Self-pay | Admitting: Emergency Medicine

## 2017-06-03 ENCOUNTER — Other Ambulatory Visit (INDEPENDENT_AMBULATORY_CARE_PROVIDER_SITE_OTHER): Payer: BC Managed Care – PPO

## 2017-06-03 VITALS — BP 120/74 | HR 84 | Temp 98.0°F | Ht 67.0 in | Wt 248.0 lb

## 2017-06-03 DIAGNOSIS — E559 Vitamin D deficiency, unspecified: Secondary | ICD-10-CM

## 2017-06-03 DIAGNOSIS — D509 Iron deficiency anemia, unspecified: Secondary | ICD-10-CM | POA: Diagnosis not present

## 2017-06-03 DIAGNOSIS — I10 Essential (primary) hypertension: Secondary | ICD-10-CM | POA: Diagnosis not present

## 2017-06-03 DIAGNOSIS — R5383 Other fatigue: Secondary | ICD-10-CM | POA: Diagnosis not present

## 2017-06-03 DIAGNOSIS — E538 Deficiency of other specified B group vitamins: Secondary | ICD-10-CM

## 2017-06-03 DIAGNOSIS — F411 Generalized anxiety disorder: Secondary | ICD-10-CM

## 2017-06-03 DIAGNOSIS — E785 Hyperlipidemia, unspecified: Secondary | ICD-10-CM

## 2017-06-03 DIAGNOSIS — E119 Type 2 diabetes mellitus without complications: Secondary | ICD-10-CM

## 2017-06-03 LAB — URINALYSIS, ROUTINE W REFLEX MICROSCOPIC
BILIRUBIN URINE: NEGATIVE
Hgb urine dipstick: NEGATIVE
KETONES UR: NEGATIVE
LEUKOCYTES UA: NEGATIVE
Nitrite: NEGATIVE
PH: 6 (ref 5.0–8.0)
RBC / HPF: NONE SEEN (ref 0–?)
Specific Gravity, Urine: 1.02 (ref 1.000–1.030)
TOTAL PROTEIN, URINE-UPE24: NEGATIVE
UROBILINOGEN UA: 0.2 (ref 0.0–1.0)
Urine Glucose: NEGATIVE
WBC UA: NONE SEEN (ref 0–?)

## 2017-06-03 LAB — BASIC METABOLIC PANEL
BUN: 10 mg/dL (ref 6–23)
CHLORIDE: 104 meq/L (ref 96–112)
CO2: 30 mEq/L (ref 19–32)
Calcium: 10.3 mg/dL (ref 8.4–10.5)
Creatinine, Ser: 0.99 mg/dL (ref 0.40–1.20)
GFR: 74.99 mL/min (ref >60.00)
GLUCOSE: 112 mg/dL — AB (ref 70–99)
POTASSIUM: 3.7 meq/L (ref 3.5–5.1)
Sodium: 138 mEq/L (ref 135–145)

## 2017-06-03 LAB — CBC WITH DIFFERENTIAL/PLATELET
BASOS PCT: 1.4 % (ref 0.0–3.0)
Basophils Absolute: 0.1 10*3/uL (ref 0.0–0.1)
EOS ABS: 0.1 10*3/uL (ref 0.0–0.7)
Eosinophils Relative: 1.6 % (ref 0.0–5.0)
HCT: 42.8 % (ref 36.0–46.0)
Hemoglobin: 13.2 g/dL (ref 12.0–15.0)
Lymphocytes Relative: 32.8 % (ref 12.0–46.0)
Lymphs Abs: 1.8 10*3/uL (ref 0.7–4.0)
MCHC: 30.7 g/dL (ref 30.0–36.0)
MCV: 69.6 fl — ABNORMAL LOW (ref 78.0–100.0)
MONO ABS: 0.5 10*3/uL (ref 0.1–1.0)
Monocytes Relative: 8.8 % (ref 3.0–12.0)
NEUTROS ABS: 3.1 10*3/uL (ref 1.4–7.7)
NEUTROS PCT: 55.4 % (ref 43.0–77.0)
PLATELETS: 393 10*3/uL (ref 150.0–400.0)
RBC: 6.15 Mil/uL — ABNORMAL HIGH (ref 3.87–5.11)
RDW: 15.7 % — AB (ref 11.5–15.5)
WBC: 5.6 10*3/uL (ref 4.0–10.5)

## 2017-06-03 LAB — IBC PANEL
IRON: 86 ug/dL (ref 42–145)
Saturation Ratios: 18.1 % — ABNORMAL LOW (ref 20.0–50.0)
Transferrin: 339 mg/dL (ref 212.0–360.0)

## 2017-06-03 LAB — HEPATIC FUNCTION PANEL
ALT: 35 U/L (ref 0–35)
AST: 26 U/L (ref 0–37)
Albumin: 4.7 g/dL (ref 3.5–5.2)
Alkaline Phosphatase: 141 U/L — ABNORMAL HIGH (ref 39–117)
BILIRUBIN DIRECT: 0 mg/dL (ref 0.0–0.3)
BILIRUBIN TOTAL: 0.4 mg/dL (ref 0.2–1.2)
Total Protein: 8.4 g/dL — ABNORMAL HIGH (ref 6.0–8.3)

## 2017-06-03 LAB — TSH: TSH: 2.24 u[IU]/mL (ref 0.35–4.50)

## 2017-06-03 LAB — VITAMIN D 25 HYDROXY (VIT D DEFICIENCY, FRACTURES): VITD: 22.9 ng/mL — ABNORMAL LOW (ref 30.00–100.00)

## 2017-06-03 LAB — VITAMIN B12: Vitamin B-12: 495 pg/mL (ref 211–911)

## 2017-06-03 NOTE — Progress Notes (Signed)
Subjective:    Patient ID: Jessica Daniels, female    DOB: 04/04/1961, 56 y.o.   MRN: [id number]  HPI  Here to f/u with CC  Of fatigue, lack of energy and stamina over 1 wk, without obvious cause.  Denies fever, ST, cough and Denies urinary symptoms such as dysuria, frequency, urgency, flank pain, hematuria or n/v, chills.  Pt denies chest pain, increased sob or doe, wheezing, orthopnea, PND, increased LE swelling, palpitations, dizziness or syncope.  Pt denies new neurological symptoms such as new headache, or facial or extremity weakness or numbness   Pt denies polydipsia, polyuria, in fact states cbg's have been improved to 130-140's with better diet and meds.  Has felt more nervous and anxious but for no apparent overt reason as she has no recent hx of significant life event.  This kind of nervousness has occurred in past a few yrs ago and seemed to improve until now again.Denies worsening depressive symptoms, suicidal ideation, but has had several near panic attacks.  She has hx of iron def anemia, and is most concerned today with lab eval to recheck this.  No recent overt bleeding or bruising BP Readings from Last 3 Encounters:  06/03/17 120/74  04/14/17 126/88  10/13/16 124/80   Wt Readings from Last 3 Encounters:  06/03/17 248 lb (112.5 kg)  04/14/17 249 lb (112.9 kg)  10/13/16 245 lb (111.1 kg)  Now on lower glipizide er 2.5 per day since last visit.  No OSA symptoms such as daytime somnolence Past Medical History:  Diagnosis Date  . Allergic rhinitis   . Anemia   . Anxiety   . Depression   . Diabetes mellitus   . Hyperlipidemia   . Hypertension    Past Surgical History:  Procedure Laterality Date  . CHOLECYSTECTOMY    . LUMBAR LAMINECTOMY      reports that  has never smoked. she has never used smokeless tobacco. She reports that she does not drink alcohol or use drugs. family history includes Diabetes in her unknown relative; Heart attack in her mother; Lung cancer in her  father. Allergies  Allergen Reactions  . Crestor [Rosuvastatin]     Elevated liver enzymes   Current Outpatient Medications on File Prior to Visit  Medication Sig Dispense Refill  . amLODipine (NORVASC) 10 MG tablet Take 1 tablet (10 mg total) by mouth daily. 90 tablet 3  . aspirin EC 81 MG tablet Take 1 tablet (81 mg total) by mouth daily. 90 tablet 11  . atorvastatin (LIPITOR) 20 MG tablet Take 1 tablet (20 mg total) by mouth daily. 90 tablet 3  . benazepril (LOTENSIN) 40 MG tablet Take 1 tablet (40 mg total) by mouth daily. 90 tablet 3  . glipiZIDE (GLUCOTROL XL) 2.5 MG 24 hr tablet Take 1 tablet (2.5 mg total) by mouth daily with breakfast. 90 tablet 3  . metFORMIN (GLUCOPHAGE) 1000 MG tablet Take 1 tablet (1,000 mg total) by mouth daily with breakfast. 90 tablet 3  . sitaGLIPtin (JANUVIA) 100 MG tablet Take 1 tablet (100 mg total) by mouth daily. 90 tablet 3   No current facility-administered medications on file prior to visit.    Review of Systems  Constitutional: Negative for other unusual diaphoresis or sweats HENT: Negative for ear discharge or swelling Eyes: Negative for other worsening visual disturbances Respiratory: Negative for stridor or other swelling  Gastrointestinal: Negative for worsening distension or other blood Genitourinary: Negative for retention or other urinary change Musculoskeletal: Negative for  other MSK pain or swelling Skin: Negative for color change or other new lesions Neurological: Negative for worsening tremors and other numbness  Psychiatric/Behavioral: Negative for worsening agitation or other fatigue All other system neg per pt    Objective:   Physical Exam BP 120/74 (BP Location: Left Arm, Patient Position: Sitting, Cuff Size: Large)   Pulse 84   Temp 98 F (36.7 C) (Oral)   Ht 5\' 7"  (1.702 m)   Wt 248 lb (112.5 kg)   LMP 08/06/2015   SpO2 98%   BMI 38.84 kg/m  VS noted,  Constitutional: Pt appears in NAD HENT: Head: NCAT.  Right  Ear: External ear normal.  Left Ear: External ear normal.  Eyes: . Pupils are equal, round, and reactive to light. Conjunctivae and EOM are normal Nose: without d/c or deformity Neck: Neck supple. Gross normal ROM Cardiovascular: Normal rate and regular rhythm.   Pulmonary/Chest: Effort normal and breath sounds without rales or wheezing.  Abd:  Soft, NT, ND, + BS, no organomegaly Neurological: Pt is alert. At baseline orientation, motor grossly intact Skin: Skin is warm. No rashes, other new lesions, no LE edema Psychiatric: Pt behavior is normal without agitation but with mild to mod nervous No other exam findings    Assessment & Plan:

## 2017-06-03 NOTE — Patient Instructions (Addendum)

## 2017-06-04 NOTE — Assessment & Plan Note (Signed)
stable overall by history and exam, recent data reviewed with pt, and pt to continue medical treatment as before,  to f/u any worsening symptoms or concerns Lab Results  Component Value Date   LDLCALC 85 04/14/2017

## 2017-06-04 NOTE — Assessment & Plan Note (Signed)
Etiology unclear, Exam otherwise benign, to check labs as documented, follow with expectant management  

## 2017-06-04 NOTE — Assessment & Plan Note (Signed)
Not clear if her most significant problem to account for symptoms, but will check labs per pt request

## 2017-06-04 NOTE — Assessment & Plan Note (Signed)
BP Readings from Last 3 Encounters:  06/03/17 120/74  04/14/17 126/88  10/13/16 124/80  stable overall by history and exam, recent data reviewed with pt, and pt to continue medical treatment as before,  to f/u any worsening symptoms or concerns

## 2017-06-04 NOTE — Assessment & Plan Note (Signed)
I suspect this is her primary source of symptoms, for lab eval as ordered, pt declines specific tx or referral for counseling, just wants labs

## 2017-06-04 NOTE — Assessment & Plan Note (Signed)
Despite higher recent a1c and reduced OHA, pt states CBG's are improved.  Will cont same tx, f/u a1c with next labs

## 2017-09-30 ENCOUNTER — Encounter: Payer: Self-pay | Admitting: Family

## 2017-09-30 ENCOUNTER — Ambulatory Visit: Payer: BC Managed Care – PPO | Admitting: Family

## 2017-09-30 VITALS — BP 140/88 | HR 77 | Temp 98.0°F | Ht 67.0 in | Wt 243.1 lb

## 2017-09-30 DIAGNOSIS — J019 Acute sinusitis, unspecified: Secondary | ICD-10-CM

## 2017-09-30 DIAGNOSIS — J209 Acute bronchitis, unspecified: Secondary | ICD-10-CM

## 2017-09-30 MED ORDER — FLUTICASONE PROPIONATE 50 MCG/ACT NA SUSP: 2.0000 | Freq: Every day | NASAL | 6 refills | Status: AC

## 2017-09-30 MED ORDER — CEFDINIR 300 MG PO CAPS: 300.0000 mg | ORAL_CAPSULE | Freq: Two times a day (BID) | ORAL | 0 refills | Status: DC

## 2017-09-30 MED ORDER — BENZONATATE 100 MG PO CAPS: 100.0000 mg | ORAL_CAPSULE | Freq: Three times a day (TID) | ORAL | 0 refills | Status: DC | PRN

## 2017-09-30 NOTE — Progress Notes (Signed)
Jessica Daniels is a 57 y.o. female with the following history as recorded in [employer]:  Patient Active Problem List   Diagnosis Date Noted  . Fatigue 06/03/2017  . Wheezing 07/13/2016  . Cough 11/13/2015  . Primary osteoarthritis of left knee 10/30/2014  . Diabetes (HCC) 06/04/2014  . Pain in both feet 02/20/2014  . Headache(784.0) 11/01/2013  . Low back pain 01/30/2013  . Preventative health care 01/22/2011  . Palpitations 04/02/2008  . Hyperlipidemia 07/27/2007  . Iron deficiency anemia 04/12/2007  . Anxiety state 04/12/2007  . DEPRESSION 04/12/2007  . ALLERGIC RHINITIS 04/12/2007  . Essential hypertension 02/22/2007    Current Outpatient Medications  Medication Sig Dispense Refill  . amLODipine (NORVASC) 10 MG tablet Take 1 tablet (10 mg total) by mouth daily. 90 tablet 3  . aspirin EC 81 MG tablet Take 1 tablet (81 mg total) by mouth daily. 90 tablet 11  . atorvastatin (LIPITOR) 20 MG tablet Take 1 tablet (20 mg total) by mouth daily. 90 tablet 3  . benazepril (LOTENSIN) 40 MG tablet Take 1 tablet (40 mg total) by mouth daily. 90 tablet 3  . glipiZIDE (GLUCOTROL XL) 2.5 MG 24 hr tablet Take 1 tablet (2.5 mg total) by mouth daily with breakfast. 90 tablet 3  . metFORMIN (GLUCOPHAGE) 1000 MG tablet Take 1 tablet (1,000 mg total) by mouth daily with breakfast. 90 tablet 3  . sitaGLIPtin (JANUVIA) 100 MG tablet Take 1 tablet (100 mg total) by mouth daily. 90 tablet 3  . benzonatate (TESSALON) 100 MG capsule Take 1 capsule (100 mg total) by mouth 3 (three) times daily as needed. 20 capsule 0  . cefdinir (OMNICEF) 300 MG capsule Take 1 capsule (300 mg total) by mouth 2 (two) times daily. 20 capsule 0  . fluticasone (FLONASE) 50 MCG/ACT nasal spray Place 2 sprays into both nostrils daily. 16 g 6   No current facility-administered medications for this visit.     Allergies: Crestor [rosuvastatin]  Past Medical History:  Diagnosis Date  . Allergic rhinitis   . Anemia   .  Anxiety   . Depression   . Diabetes mellitus   . Hyperlipidemia   . Hypertension     Past Surgical History:  Procedure Laterality Date  . CHOLECYSTECTOMY    . LUMBAR LAMINECTOMY      Family History  Problem Relation Age of Onset  . Diabetes Unknown        1st degree relative  . Heart attack Mother   . Lung cancer Father     Social History   Tobacco Use  . Smoking status: Never Smoker  . Smokeless tobacco: Never Used  Substance Use Topics  . Alcohol use: No    Subjective:  Patient presents with concerns for cough/ congestion x 1 week; +thick, yellow congestion; + wheezing; +prone to bronchitis; using OTC Alka-Seltzer and Robitussin, Mucinex with limited benefit;   Objective:  Vitals:   09/30/17 0833  BP: 140/88  Pulse: 77  Temp: 98 F (36.7 C)  TempSrc: Oral  SpO2: 99%  Weight: 243 lb 1.3 oz (110.3 kg)  Height: 5\' 7"  (1.702 m)    General: Well developed, well nourished, in no acute distress  Skin : Warm and dry.  Head: Normocephalic and atraumatic  Eyes: Sclera and conjunctiva clear; pupils round and reactive to light; extraocular movements intact  Ears: External normal; canals clear; tympanic membranes normal  Oropharynx: Pink, supple. No suspicious lesions  Neck: Supple without thyromegaly, adenopathy  Lungs: Respirations  unlabored; clear to auscultation bilaterally without wheeze, rales, rhonchi  CVS exam: normal rate and regular rhythm.  Neurologic: Alert and oriented; speech intact; face symmetrical; moves all extremities well; CNII-XII intact without focal deficit   Assessment:  1. Acute sinusitis, recurrence not specified, unspecified location   2. Acute bronchitis, unspecified organism     Plan:  Rx for Omnicef 300 mg bid x 10 days, Flonase and Tessalon; increase fluids, rest and follow-up worse, no better.   No follow-ups on file.  No orders of the defined types were placed in this encounter.   Requested Prescriptions   Signed Prescriptions Disp  Refills  . cefdinir (OMNICEF) 300 MG capsule 20 capsule 0    Sig: Take 1 capsule (300 mg total) by mouth 2 (two) times daily.  . fluticasone (FLONASE) 50 MCG/ACT nasal spray 16 g 6    Sig: Place 2 sprays into both nostrils daily.  . benzonatate (TESSALON) 100 MG capsule 20 capsule 0    Sig: Take 1 capsule (100 mg total) by mouth 3 (three) times daily as needed.

## 2017-10-13 ENCOUNTER — Other Ambulatory Visit (INDEPENDENT_AMBULATORY_CARE_PROVIDER_SITE_OTHER): Payer: BC Managed Care – PPO

## 2017-10-13 ENCOUNTER — Encounter: Payer: Self-pay | Admitting: Internal Medicine

## 2017-10-13 ENCOUNTER — Other Ambulatory Visit: Payer: Self-pay | Admitting: Internal Medicine

## 2017-10-13 ENCOUNTER — Ambulatory Visit: Payer: BC Managed Care – PPO | Admitting: Internal Medicine

## 2017-10-13 VITALS — BP 128/86 | HR 81 | Temp 97.9°F | Ht 67.0 in | Wt 244.0 lb

## 2017-10-13 DIAGNOSIS — E119 Type 2 diabetes mellitus without complications: Secondary | ICD-10-CM

## 2017-10-13 DIAGNOSIS — Z114 Encounter for screening for human immunodeficiency virus [HIV]: Secondary | ICD-10-CM | POA: Diagnosis not present

## 2017-10-13 DIAGNOSIS — Z0000 Encounter for general adult medical examination without abnormal findings: Secondary | ICD-10-CM

## 2017-10-13 LAB — HEPATIC FUNCTION PANEL
ALT: 21 U/L (ref 0–35)
AST: 16 U/L (ref 0–37)
Albumin: 4.4 g/dL (ref 3.5–5.2)
Alkaline Phosphatase: 124 U/L — ABNORMAL HIGH (ref 39–117)
BILIRUBIN DIRECT: 0.1 mg/dL (ref 0.0–0.3)
BILIRUBIN TOTAL: 0.5 mg/dL (ref 0.2–1.2)
Total Protein: 7.8 g/dL (ref 6.0–8.3)

## 2017-10-13 LAB — BASIC METABOLIC PANEL
BUN: 9 mg/dL (ref 6–23)
CO2: 29 meq/L (ref 19–32)
Calcium: 10 mg/dL (ref 8.4–10.5)
Chloride: 101 mEq/L (ref 96–112)
Creatinine, Ser: 1 mg/dL (ref 0.40–1.20)
GFR: 74.03 mL/min (ref >60.00)
GLUCOSE: 240 mg/dL — AB (ref 70–99)
Potassium: 4.1 mEq/L (ref 3.5–5.1)
Sodium: 137 mEq/L (ref 135–145)

## 2017-10-13 LAB — CBC WITH DIFFERENTIAL/PLATELET
Basophils Absolute: 0 10*3/uL (ref 0.0–0.1)
Basophils Relative: 0.6 % (ref 0.0–3.0)
EOS ABS: 0.1 10*3/uL (ref 0.0–0.7)
EOS PCT: 2 % (ref 0.0–5.0)
HCT: 40.7 % (ref 36.0–46.0)
HEMOGLOBIN: 12.8 g/dL (ref 12.0–15.0)
LYMPHS ABS: 1.4 10*3/uL (ref 0.7–4.0)
Lymphocytes Relative: 26.3 % (ref 12.0–46.0)
MCHC: 31.5 g/dL (ref 30.0–36.0)
MCV: 68.6 fl — ABNORMAL LOW (ref 78.0–100.0)
MONO ABS: 0.4 10*3/uL (ref 0.1–1.0)
Monocytes Relative: 7.4 % (ref 3.0–12.0)
NEUTROS PCT: 63.7 % (ref 43.0–77.0)
Neutro Abs: 3.4 10*3/uL (ref 1.4–7.7)
Platelets: 336 10*3/uL (ref 150.0–400.0)
RBC: 5.94 Mil/uL — AB (ref 3.87–5.11)
RDW: 15.8 % — AB (ref 11.5–15.5)
WBC: 5.3 10*3/uL (ref 4.0–10.5)

## 2017-10-13 LAB — URINALYSIS, ROUTINE W REFLEX MICROSCOPIC
BILIRUBIN URINE: NEGATIVE
Hgb urine dipstick: NEGATIVE
KETONES UR: NEGATIVE
LEUKOCYTES UA: NEGATIVE
NITRITE: NEGATIVE
URINE GLUCOSE: 500 — AB
UROBILINOGEN UA: 0.2 (ref 0.0–1.0)
pH: 5.5 (ref 5.0–8.0)

## 2017-10-13 LAB — LIPID PANEL
Cholesterol: 154 mg/dL (ref 0–200)
HDL: 44.6 mg/dL (ref >39.00)
LDL CALC: 85 mg/dL (ref 0–99)
NonHDL: 109.74
TRIGLYCERIDES: 123 mg/dL (ref 0.0–149.0)
Total CHOL/HDL Ratio: 3
VLDL: 24.6 mg/dL (ref 0.0–40.0)

## 2017-10-13 LAB — HEMOGLOBIN A1C: Hgb A1c MFr Bld: 9.7 % — ABNORMAL HIGH (ref 4.6–6.5)

## 2017-10-13 LAB — MICROALBUMIN / CREATININE URINE RATIO
CREATININE, U: 154.4 mg/dL
Microalb Creat Ratio: 6.2 mg/g (ref 0.0–30.0)
Microalb, Ur: 9.6 mg/dL — ABNORMAL HIGH (ref 0.0–1.9)

## 2017-10-13 LAB — TSH: TSH: 1.56 u[IU]/mL (ref 0.35–4.50)

## 2017-10-13 MED ORDER — METFORMIN HCL 1000 MG PO TABS: 1000.0000 mg | ORAL_TABLET | Freq: Every day | ORAL | 3 refills | Status: DC

## 2017-10-13 MED ORDER — SITAGLIPTIN PHOSPHATE 100 MG PO TABS: 100.0000 mg | ORAL_TABLET | Freq: Every day | ORAL | 3 refills | Status: DC

## 2017-10-13 MED ORDER — METFORMIN HCL ER 500 MG PO TB24: 500.0000 mg | ORAL_TABLET | Freq: Every day | ORAL | 3 refills | Status: DC

## 2017-10-13 MED ORDER — AMLODIPINE BESYLATE 10 MG PO TABS: 10.0000 mg | ORAL_TABLET | Freq: Every day | ORAL | 3 refills | Status: DC

## 2017-10-13 MED ORDER — GLIPIZIDE ER 2.5 MG PO TB24: 2.5000 mg | ORAL_TABLET | Freq: Every day | ORAL | 3 refills | Status: DC

## 2017-10-13 MED ORDER — BENAZEPRIL HCL 40 MG PO TABS: 40.0000 mg | ORAL_TABLET | Freq: Every day | ORAL | 3 refills | Status: DC

## 2017-10-13 MED ORDER — ATORVASTATIN CALCIUM 20 MG PO TABS: 20.0000 mg | ORAL_TABLET | Freq: Every day | ORAL | 3 refills | Status: DC

## 2017-10-13 NOTE — Patient Instructions (Addendum)
Please check with your insurance about coverage for the Cologuard home testing  Please continue all other medications as before, and refills have been done if requested.  Please have the pharmacy call with any other refills you may need.  Please continue your efforts at being more active, low cholesterol diet, and weight control.  You are otherwise up to date with prevention measures today.  Please keep your appointments with your specialists as you may have planned  Please go to the LAB in the Basement (turn left off the elevator) for the tests to be done today  You will be contacted by phone if any changes need to be made immediately.  Otherwise, you will receive a letter about your results with an explanation, but please check with MyChart first.  Please remember to sign up for MyChart if you have not done so, as this will be important to you in the future with finding out test results, communicating by private email, and scheduling acute appointments online when needed.  Please return in 6 months, or sooner if needed, with Lab testing done 3-5 days before

## 2017-10-13 NOTE — Progress Notes (Signed)
Subjective:    Patient ID: Jessica Daniels, female    DOB: 12/18/1960, 57 y.o.   MRN: [id number]  HPI  Here for wellness and f/u;  Overall doing ok;  Pt denies Chest pain, worsening SOB, DOE, wheezing, orthopnea, PND, worsening LE edema, palpitations, dizziness or syncope.  Pt denies neurological change such as new headache, facial or extremity weakness.  Pt denies polydipsia, polyuria, or low sugar symptoms. Pt states overall good compliance with treatment and medications, good tolerability, and has been trying to follow appropriate diet.  Pt denies worsening depressive symptoms, suicidal ideation or panic. No fever, night sweats, wt loss, loss of appetite, or other constitutional symptoms.  Pt states good ability with ADL's, has low fall risk, home safety reviewed and adequate, no other significant changes in hearing or vision, and not active with exercise. Declines immunizations Wt Readings from Last 3 Encounters:  10/13/17 244 lb (110.7 kg)  09/30/17 243 lb 1.3 oz (110.3 kg)  06/03/17 248 lb (112.5 kg)   BP Readings from Last 3 Encounters:  10/13/17 128/86  09/30/17 140/88  06/03/17 120/74  Recent sinusitis resolved.  No other interval hx or new complaints Past Medical History:  Diagnosis Date  . Allergic rhinitis   . Anemia   . Anxiety   . Depression   . Diabetes mellitus   . Hyperlipidemia   . Hypertension    Past Surgical History:  Procedure Laterality Date  . CHOLECYSTECTOMY    . LUMBAR LAMINECTOMY      reports that she has never smoked. She has never used smokeless tobacco. She reports that she does not drink alcohol or use drugs. family history includes Diabetes in her unknown relative; Heart attack in her mother; Lung cancer in her father. Allergies  Allergen Reactions  . Crestor [Rosuvastatin]     Elevated liver enzymes   Current Outpatient Medications on File Prior to Visit  Medication Sig Dispense Refill  . aspirin EC 81 MG tablet Take 1 tablet (81 mg total)  by mouth daily. 90 tablet 11  . benzonatate (TESSALON) 100 MG capsule Take 1 capsule (100 mg total) by mouth 3 (three) times daily as needed. 20 capsule 0  . cefdinir (OMNICEF) 300 MG capsule Take 1 capsule (300 mg total) by mouth 2 (two) times daily. 20 capsule 0  . fluticasone (FLONASE) 50 MCG/ACT nasal spray Place 2 sprays into both nostrils daily. 16 g 6   No current facility-administered medications on file prior to visit.    Review of Systems Constitutional: Negative for other unusual diaphoresis, sweats, appetite or weight changes HENT: Negative for other worsening hearing loss, ear pain, facial swelling, mouth sores or neck stiffness.   Eyes: Negative for other worsening pain, redness or other visual disturbance.  Respiratory: Negative for other stridor or swelling Cardiovascular: Negative for other palpitations or other chest pain  Gastrointestinal: Negative for worsening diarrhea or loose stools, blood in stool, distention or other pain Genitourinary: Negative for hematuria, flank pain or other change in urine volume.  Musculoskeletal: Negative for myalgias or other joint swelling.  Skin: Negative for other color change, or other wound or worsening drainage.  Neurological: Negative for other syncope or numbness. Hematological: Negative for other adenopathy or swelling Psychiatric/Behavioral: Negative for hallucinations, other worsening agitation, SI, self-injury, or new decreased concentration\All other system neg per pt    Objective:   Physical Exam BP 128/86   Pulse 81   Temp 97.9 F (36.6 C) (Oral)   Ht 5\' 7"  (  1.702 m)   Wt 244 lb (110.7 kg)   LMP 08/06/2015   SpO2 97%   BMI 38.22 kg/m  VS noted, obese Constitutional: Pt is oriented to person, place, and time. Appears well-developed and well-nourished, in no significant distress and comfortable Head: Normocephalic and atraumatic  Eyes: Conjunctivae and EOM are normal. Pupils are equal, round, and reactive to  light Right Ear: External ear normal without discharge Left Ear: External ear normal without discharge Nose: Nose without discharge or deformity Mouth/Throat: Oropharynx is without other ulcerations and moist  Neck: Normal range of motion. Neck supple. No JVD present. No tracheal deviation present or significant neck LA or mass Cardiovascular: Normal rate, regular rhythm, normal heart sounds and intact distal pulses.   Pulmonary/Chest: WOB normal and breath sounds without rales or wheezing  Abdominal: Soft. Bowel sounds are normal. NT. No HSM  Musculoskeletal: Normal range of motion. Exhibits no edema Lymphadenopathy: Has no other cervical adenopathy.  Neurological: Pt is alert and oriented to person, place, and time. Pt has normal reflexes. No cranial nerve deficit. Motor grossly intact, Gait intact Skin: Skin is warm and dry. No rash noted or new ulcerations Psychiatric:  Has normal mood and affect. Behavior is normal without agitation No other exam findings  Lab Results  Component Value Date   WBC 5.6 06/03/2017   HGB 13.2 06/03/2017   HCT 42.8 06/03/2017   PLT 393.0 06/03/2017   GLUCOSE 112 (H) 06/03/2017   CHOL 154 04/14/2017   TRIG 132.0 04/14/2017   HDL 42.80 04/14/2017   LDLDIRECT 137.4 10/20/2009   LDLCALC 85 04/14/2017   ALT 35 06/03/2017   AST 26 06/03/2017   NA 138 06/03/2017   K 3.7 06/03/2017   CL 104 06/03/2017   CREATININE 0.99 06/03/2017   BUN 10 06/03/2017   CO2 30 06/03/2017   TSH 2.24 06/03/2017   HGBA1C 8.4 (H) 04/14/2017   MICROALBUR 2.3 (H) 09/10/2015      Assessment & Plan:

## 2017-10-14 ENCOUNTER — Telehealth: Payer: Self-pay

## 2017-10-14 LAB — HIV ANTIBODY (ROUTINE TESTING W REFLEX): HIV 1&2 Ab, 4th Generation: NONREACTIVE

## 2017-10-14 NOTE — Telephone Encounter (Signed)
-----   Message from Corwin LevinsJames W [name], MD sent at 10/13/2017  1:00 PM EDT ----- It is also ok to take the metformin she has at 1000 mg twice per day until used up, then start the new prescription

## 2017-10-14 NOTE — Telephone Encounter (Signed)
Called pt, received a operator msg stating that the number has been disconnected.

## 2017-10-15 NOTE — Assessment & Plan Note (Signed)

## 2017-10-15 NOTE — Assessment & Plan Note (Signed)
stable overall by history and exam, recent data reviewed with pt, and pt to continue medical treatment as before,  to f/u any worsening symptoms or concerns, for f/u a1c 

## 2018-02-09 ENCOUNTER — Encounter: Payer: Self-pay | Admitting: Internal Medicine

## 2018-02-09 ENCOUNTER — Ambulatory Visit: Payer: BC Managed Care – PPO | Admitting: Internal Medicine

## 2018-02-09 VITALS — BP 144/90 | HR 105 | Temp 97.8°F | Ht 67.0 in | Wt 238.0 lb

## 2018-02-09 DIAGNOSIS — E119 Type 2 diabetes mellitus without complications: Secondary | ICD-10-CM

## 2018-02-09 DIAGNOSIS — H01001 Unspecified blepharitis right upper eyelid: Secondary | ICD-10-CM

## 2018-02-09 DIAGNOSIS — I10 Essential (primary) hypertension: Secondary | ICD-10-CM | POA: Diagnosis not present

## 2018-02-09 DIAGNOSIS — L03113 Cellulitis of right upper limb: Secondary | ICD-10-CM | POA: Insufficient documentation

## 2018-02-09 MED ORDER — PREDNISONE 10 MG PO TABS
ORAL_TABLET | ORAL | 0 refills | Status: DC
Start: 2018-02-09 — End: 2018-04-19

## 2018-02-09 MED ORDER — AZITHROMYCIN 250 MG PO TABS: ORAL_TABLET | ORAL | 1 refills | Status: DC

## 2018-02-09 NOTE — Assessment & Plan Note (Signed)
stable overall by history and exam, recent data reviewed with pt, and pt to continue medical treatment as before,  to f/u any worsening symptoms or concerns, to follow sugars closely on prednisone, call for > 200

## 2018-02-09 NOTE — Assessment & Plan Note (Signed)
Mild elevated likely situational, o/w stable overall by history and exam, recent data reviewed with pt, and pt to continue medical treatment as before,  to f/u any worsening symptoms or concerns BP Readings from Last 3 Encounters:  02/09/18 (!) 144/90  10/13/17 128/86  09/30/17 140/88

## 2018-02-09 NOTE — Assessment & Plan Note (Signed)
?   Allergic angioedema reaction, for predpac asd

## 2018-02-09 NOTE — Progress Notes (Signed)
Subjective:    Patient ID: Jessica Daniels, female    DOB: 01/28/1961, 57 y.o.   MRN: [id number]  HPI  Here to f/u after episode yesterday of yardwork.  Did stand and had noted fire ants to the distal legs and swatted them off with right hand, but some got on the right hand about the thumb and index finger with several bites it seems. Today woke with marked red, tender, swelling to the area, without open wound or drainage.  Also mentions right upper eyelid for some reason is markedly swelling with mild discomfort since this am as well, not affecting the vision.  No fever, red streaks.  Pt denies chest pain, increased sob or doe, wheezing, orthopnea, PND, increased LE swelling, palpitations, dizziness or syncope.  Pt denies new neurological symptoms such as new headache, or facial or extremity weakness or numbness   Pt denies polydipsia, polyuria. Past Medical History:  Diagnosis Date  . Allergic rhinitis   . Anemia   . Anxiety   . Depression   . Diabetes mellitus   . Hyperlipidemia   . Hypertension    Past Surgical History:  Procedure Laterality Date  . CHOLECYSTECTOMY    . LUMBAR LAMINECTOMY      reports that she has never smoked. She has never used smokeless tobacco. She reports that she does not drink alcohol or use drugs. family history includes Diabetes in her unknown relative; Heart attack in her mother; Lung cancer in her father. Allergies  Allergen Reactions  . Crestor [Rosuvastatin]     Elevated liver enzymes   Current Outpatient Medications on File Prior to Visit  Medication Sig Dispense Refill  . amLODipine (NORVASC) 10 MG tablet Take 1 tablet (10 mg total) by mouth daily. 90 tablet 3  . aspirin EC 81 MG tablet Take 1 tablet (81 mg total) by mouth daily. 90 tablet 11  . atorvastatin (LIPITOR) 20 MG tablet Take 1 tablet (20 mg total) by mouth daily. 90 tablet 3  . benazepril (LOTENSIN) 40 MG tablet Take 1 tablet (40 mg total) by mouth daily. 90 tablet 3  . benzonatate  (TESSALON) 100 MG capsule Take 1 capsule (100 mg total) by mouth 3 (three) times daily as needed. 20 capsule 0  . cefdinir (OMNICEF) 300 MG capsule Take 1 capsule (300 mg total) by mouth 2 (two) times daily. 20 capsule 0  . fluticasone (FLONASE) 50 MCG/ACT nasal spray Place 2 sprays into both nostrils daily. 16 g 6  . glipiZIDE (GLUCOTROL XL) 2.5 MG 24 hr tablet Take 1 tablet (2.5 mg total) by mouth daily with breakfast. 90 tablet 3  . metFORMIN (GLUCOPHAGE-XR) 500 MG 24 hr tablet Take 1 tablet (500 mg total) by mouth daily with breakfast. 360 tablet 3  . sitaGLIPtin (JANUVIA) 100 MG tablet Take 1 tablet (100 mg total) by mouth daily. 90 tablet 3   No current facility-administered medications on file prior to visit.    Review of Systems  Constitutional: Negative for other unusual diaphoresis or sweats HENT: Negative for ear discharge or swelling Eyes: Negative for other worsening visual disturbances Respiratory: Negative for stridor or other swelling  Gastrointestinal: Negative for worsening distension or other blood Genitourinary: Negative for retention or other urinary change Musculoskeletal: Negative for other MSK pain or swelling Skin: Negative for color change or other new lesions Neurological: Negative for worsening tremors and other numbness  Psychiatric/Behavioral: Negative for worsening agitation or other fatigue All other system neg per pt  Objective:   Physical Exam BP (!) 144/90   Pulse (!) 105   Temp 97.8 F (36.6 C) (Oral)   Ht 5\' 7"  (1.702 m)   Wt 238 lb (108 kg)   LMP 08/06/2015   SpO2 (!) 9%   BMI 37.28 kg/m  VS noted,  Constitutional: Pt appears in NAD HENT: Head: NCAT.  Right Ear: External ear normal.  Left Ear: External ear normal.  Eyes: . Pupils are equal, round, and reactive to light. Conjunctivae and EOM are normal Nose: without d/c or deformity Neck: Neck supple. Gross normal ROM Cardiovascular: Normal rate and regular rhythm.   Pulmonary/Chest:  Effort normal and breath sounds without rales or wheezing.  Right hand - with 1-2+ red, tender, erythema of thumb and thumb base, web space and index finger confluent area, without specific bite site I can appreciate or red streaks Right upper eyelid 2+ swelling, mild tender Neurological: Pt is alert. At baseline orientation, motor grossly intact Skin: Skin is warm. No rashes, other new lesions, no LE edema Psychiatric: Pt behavior is normal without agitation  No other exam findings Lab Results  Component Value Date   WBC 5.3 10/13/2017   HGB 12.8 10/13/2017   HCT 40.7 10/13/2017   PLT 336.0 10/13/2017   GLUCOSE 240 (H) 10/13/2017   CHOL 154 10/13/2017   TRIG 123.0 10/13/2017   HDL 44.60 10/13/2017   LDLDIRECT 137.4 10/20/2009   LDLCALC 85 10/13/2017   ALT 21 10/13/2017   AST 16 10/13/2017   NA 137 10/13/2017   K 4.1 10/13/2017   CL 101 10/13/2017   CREATININE 1.00 10/13/2017   BUN 9 10/13/2017   CO2 29 10/13/2017   TSH 1.56 10/13/2017   HGBA1C 9.7 (H) 10/13/2017   MICROALBUR 9.6 (H) 10/13/2017       Assessment & Plan:

## 2018-02-09 NOTE — Assessment & Plan Note (Signed)
Mild to mod, for antibx course,  to f/u any worsening symptoms or concerns 

## 2018-02-09 NOTE — Patient Instructions (Signed)
Please take all new medication as prescribed - the antibiotic, and the prednisone  Please continue all other medications as before, and refills have been done if requested.  Please have the pharmacy call with any other refills you may need.  Please keep your appointments with your specialists as you may have planned

## 2018-03-13 ENCOUNTER — Other Ambulatory Visit: Payer: BC Managed Care – PPO

## 2018-03-13 ENCOUNTER — Ambulatory Visit: Payer: BC Managed Care – PPO | Admitting: Internal Medicine

## 2018-03-13 VITALS — BP 126/84 | HR 72 | Temp 98.1°F | Resp 16 | Wt 235.0 lb

## 2018-03-13 DIAGNOSIS — I10 Essential (primary) hypertension: Secondary | ICD-10-CM | POA: Diagnosis not present

## 2018-03-13 DIAGNOSIS — R300 Dysuria: Secondary | ICD-10-CM

## 2018-03-13 DIAGNOSIS — E119 Type 2 diabetes mellitus without complications: Secondary | ICD-10-CM

## 2018-03-13 LAB — POCT URINALYSIS DIPSTICK
BILIRUBIN UA: NEGATIVE
GLUCOSE UA: POSITIVE — AB
Ketones, UA: NEGATIVE
Leukocytes, UA: NEGATIVE
Nitrite, UA: NEGATIVE
Protein, UA: NEGATIVE
RBC UA: NEGATIVE
SPEC GRAV UA: 1.02 (ref 1.010–1.025)
Urobilinogen, UA: 0.2 E.U./dL
pH, UA: 6 (ref 5.0–8.0)

## 2018-03-13 MED ORDER — DULAGLUTIDE 0.75 MG/0.5ML ~~LOC~~ SOAJ: 0.5000 mL | SUBCUTANEOUS | 3 refills | Status: DC

## 2018-03-13 NOTE — Progress Notes (Signed)
Subjective:    Patient ID: Jessica Daniels, female    DOB: [birth year], 57 y.o.   MRN: [id number]  HPI  Here to f/u; overall doing ok,  Pt denies chest pain, increasing sob or doe, wheezing, orthopnea, PND, increased LE swelling, palpitations, dizziness or syncope.  Pt denies new neurological symptoms such as new headache, or facial or extremity weakness or numbness.  Pt denies polydipsia, polyuria, or low sugar episode.  Pt states overall good compliance with meds, mostly trying to follow appropriate diet, Has lost wt with better diet, but sugars still in 200's quite often, actually 300 today fasting.   Wt Readings from Last 3 Encounters:  03/13/18 235 lb (106.6 kg)  02/09/18 238 lb (108 kg)  10/13/17 244 lb (110.7 kg)  Also with 1 wk of discomfort on urination with urgency and frequency, but Denies urinary symptoms such as flank pain, hematuria or n/v, fever, chills.   Past Medical History:  Diagnosis Date  . Allergic rhinitis   . Anemia   . Anxiety   . Depression   . Diabetes mellitus   . Hyperlipidemia   . Hypertension    Past Surgical History:  Procedure Laterality Date  . CHOLECYSTECTOMY    . LUMBAR LAMINECTOMY      reports that she has never smoked. She has never used smokeless tobacco. She reports that she does not drink alcohol or use drugs. family history includes Diabetes in her unknown relative; Heart attack in her mother; Lung cancer in her father. Allergies  Allergen Reactions  . Crestor [Rosuvastatin]     Elevated liver enzymes   Current Outpatient Medications on File Prior to Visit  Medication Sig Dispense Refill  . amLODipine (NORVASC) 10 MG tablet Take 1 tablet (10 mg total) by mouth daily. 90 tablet 3  . aspirin EC 81 MG tablet Take 1 tablet (81 mg total) by mouth daily. 90 tablet 11  . atorvastatin (LIPITOR) 20 MG tablet Take 1 tablet (20 mg total) by mouth daily. 90 tablet 3  . azithromycin (ZITHROMAX Z-PAK) 250 MG tablet 2 tab by mouth day1, then 1 per  day 6 tablet 1  . benazepril (LOTENSIN) 40 MG tablet Take 1 tablet (40 mg total) by mouth daily. 90 tablet 3  . benzonatate (TESSALON) 100 MG capsule Take 1 capsule (100 mg total) by mouth 3 (three) times daily as needed. 20 capsule 0  . cefdinir (OMNICEF) 300 MG capsule Take 1 capsule (300 mg total) by mouth 2 (two) times daily. 20 capsule 0  . fluticasone (FLONASE) 50 MCG/ACT nasal spray Place 2 sprays into both nostrils daily. 16 g 6  . glipiZIDE (GLUCOTROL XL) 2.5 MG 24 hr tablet Take 1 tablet (2.5 mg total) by mouth daily with breakfast. 90 tablet 3  . metFORMIN (GLUCOPHAGE-XR) 500 MG 24 hr tablet Take 1 tablet (500 mg total) by mouth daily with breakfast. 360 tablet 3  . predniSONE (DELTASONE) 10 MG tablet 2 tabs by mouth per day for 4 days 8 tablet 0  . sitaGLIPtin (JANUVIA) 100 MG tablet Take 1 tablet (100 mg total) by mouth daily. 90 tablet 3   No current facility-administered medications on file prior to visit.    Review of Systems  Constitutional: Negative for other unusual diaphoresis or sweats HENT: Negative for ear discharge or swelling Eyes: Negative for other worsening visual disturbances Respiratory: Negative for stridor or other swelling  Gastrointestinal: Negative for worsening distension or other blood Genitourinary: Negative for retention or other urinary  change Musculoskeletal: Negative for other MSK pain or swelling Skin: Negative for color change or other new lesions Neurological: Negative for worsening tremors and other numbness  Psychiatric/Behavioral: Negative for worsening agitation or other fatigue All other system neg per pt    Objective:   Physical Exam BP 126/84   Pulse 72   Temp 98.1 F (36.7 C) (Oral)   Resp 16   Wt 235 lb (106.6 kg)   LMP 08/06/2015   SpO2 97%   BMI 36.81 kg/m  VS noted,  Constitutional: Pt appears in NAD HENT: Head: NCAT.  Right Ear: External ear normal.  Left Ear: External ear normal.  Eyes: . Pupils are equal, round, and  reactive to light. Conjunctivae and EOM are normal Nose: without d/c or deformity Neck: Neck supple. Gross normal ROM Cardiovascular: Normal rate and regular rhythm.   Pulmonary/Chest: Effort normal and breath sounds without rales or wheezing.  Abd:  Soft, NT, ND, + BS, no organomegaly Neurological: Pt is alert. At baseline orientation, motor grossly intact Skin: Skin is warm. No rashes, other new lesions, no LE edema Psychiatric: Pt behavior is normal without agitation  No other exam findings  Lab Results  Component Value Date   HGBA1C 9.7 (H) 10/13/2017   POCT urinalysis dipstick  Order: [id number]  Status:  Final result Visible to patient:  No (Not Released) Dx:  Dysuria   Ref Range & Units 11:18 21mo ago 88mo ago 12yr ago  Color, UA  yellow      Clarity, UA  clear      Glucose, UA Negative PositiveAbnormal       Bilirubin, UA  neg      Ketones, UA  neg      Spec Grav, UA 1.010 - 1.025 1.020      Blood, UA  neg      pH, UA 5.0 - 8.0 6.0      Protein, UA Negative Negative      Urobilinogen, UA 0.2 or 1.0 E.U./dL 0.2  0.2 R 0.2 R 0.2 R  Nitrite, UA  neg      Leukocytes, UA Negative Negative               Assessment & Plan:

## 2018-03-13 NOTE — Assessment & Plan Note (Signed)
stable overall by history and exam, recent data reviewed with pt, and pt to continue medical treatment as before,  to f/u any worsening symptoms or concerns  

## 2018-03-13 NOTE — Assessment & Plan Note (Signed)
Uncontrolled, to start trulicity low dose, f/u a1c next labs

## 2018-03-13 NOTE — Assessment & Plan Note (Signed)
Exam benign, for urine studies, for tx pending results

## 2018-03-13 NOTE — Patient Instructions (Signed)
We will send the specimen for culture  Please take all new medication as prescribed - the trulicity  Please continue all other medications as before, and refills have been done if requested.  Please have the pharmacy call with any other refills you may need.  Please continue your efforts at being more active, low cholesterol diet, and weight control  Please keep your appointments with your specialists as you may have planned  We see you at your next visit as planned

## 2018-03-14 LAB — URINE CULTURE
MICRO NUMBER: 91075255
SPECIMEN QUALITY: ADEQUATE

## 2018-04-03 ENCOUNTER — Telehealth: Payer: Self-pay | Admitting: Internal Medicine

## 2018-04-03 NOTE — Telephone Encounter (Signed)
Noted  

## 2018-04-03 NOTE — Telephone Encounter (Signed)
Patient came by requesting a kit to check her blood sugars. We gave her one of the samples we had. She is going to check with her insurance and see what they cover. She will call back or let us know at her FU appointment in Oct.

## 2018-04-19 ENCOUNTER — Encounter: Payer: Self-pay | Admitting: Internal Medicine

## 2018-04-19 ENCOUNTER — Other Ambulatory Visit: Payer: Self-pay | Admitting: Internal Medicine

## 2018-04-19 ENCOUNTER — Ambulatory Visit: Payer: BC Managed Care – PPO | Admitting: Internal Medicine

## 2018-04-19 ENCOUNTER — Other Ambulatory Visit (INDEPENDENT_AMBULATORY_CARE_PROVIDER_SITE_OTHER): Payer: BC Managed Care – PPO

## 2018-04-19 VITALS — BP 122/84 | HR 84 | Temp 98.5°F | Ht 67.0 in | Wt 236.0 lb

## 2018-04-19 DIAGNOSIS — I10 Essential (primary) hypertension: Secondary | ICD-10-CM

## 2018-04-19 DIAGNOSIS — E119 Type 2 diabetes mellitus without complications: Secondary | ICD-10-CM | POA: Diagnosis not present

## 2018-04-19 DIAGNOSIS — E785 Hyperlipidemia, unspecified: Secondary | ICD-10-CM | POA: Diagnosis not present

## 2018-04-19 LAB — LIPID PANEL
Cholesterol: 134 mg/dL (ref 0–200)
HDL: 43.7 mg/dL (ref >39.00)
LDL CALC: 64 mg/dL (ref 0–99)
NonHDL: 89.9
TRIGLYCERIDES: 129 mg/dL (ref 0.0–149.0)
Total CHOL/HDL Ratio: 3
VLDL: 25.8 mg/dL (ref 0.0–40.0)

## 2018-04-19 LAB — BASIC METABOLIC PANEL
BUN: 10 mg/dL (ref 6–23)
CO2: 30 mEq/L (ref 19–32)
Calcium: 10.5 mg/dL (ref 8.4–10.5)
Chloride: 101 mEq/L (ref 96–112)
Creatinine, Ser: 0.95 mg/dL (ref 0.40–1.20)
GFR: 78.39 mL/min (ref >60.00)
Glucose, Bld: 170 mg/dL — ABNORMAL HIGH (ref 70–99)
POTASSIUM: 4 meq/L (ref 3.5–5.1)
Sodium: 139 mEq/L (ref 135–145)

## 2018-04-19 LAB — HEPATIC FUNCTION PANEL
ALBUMIN: 4.7 g/dL (ref 3.5–5.2)
ALT: 16 U/L (ref 0–35)
AST: 15 U/L (ref 0–37)
Alkaline Phosphatase: 125 U/L — ABNORMAL HIGH (ref 39–117)
Bilirubin, Direct: 0.1 mg/dL (ref 0.0–0.3)
TOTAL PROTEIN: 8.1 g/dL (ref 6.0–8.3)
Total Bilirubin: 0.4 mg/dL (ref 0.2–1.2)

## 2018-04-19 LAB — HEMOGLOBIN A1C: Hgb A1c MFr Bld: 11.4 % — ABNORMAL HIGH (ref 4.6–6.5)

## 2018-04-19 MED ORDER — DULAGLUTIDE 1.5 MG/0.5ML ~~LOC~~ SOAJ: 0.5000 mL | SUBCUTANEOUS | 3 refills | Status: DC

## 2018-04-19 MED ORDER — METFORMIN HCL ER 500 MG PO TB24: 1000.0000 mg | ORAL_TABLET | Freq: Every day | ORAL | 3 refills | Status: DC

## 2018-04-19 MED ORDER — GLIPIZIDE ER 5 MG PO TB24: 5.0000 mg | ORAL_TABLET | Freq: Every day | ORAL | 3 refills | Status: DC

## 2018-04-19 NOTE — Progress Notes (Signed)
Subjective:    Patient ID: Jessica Daniels, female    DOB: Jul 29, 1960, 57 y.o.   MRN: [id number]  HPI  Here to f/u; overall doing ok,  Pt denies chest pain, increasing sob or doe, wheezing, orthopnea, PND, increased LE swelling, palpitations, dizziness or syncope.  Pt denies new neurological symptoms such as new headache, or facial or extremity weakness or numbness.  Pt denies polydipsia, polyuria, or low sugar episode.  Pt states overall good compliance with meds, mostly trying to follow appropriate diet, with wt overall stable,  but little exercise however.  No new complaints  Wants to try to stay with lower dose trulicity due to mild abd discomfort for 2 days after injection.   Wt Readings from Last 3 Encounters:  04/19/18 236 lb (107 kg)  03/13/18 235 lb (106.6 kg)  02/09/18 238 lb (108 kg)   Past Medical History:  Diagnosis Date  . Allergic rhinitis   . Anemia   . Anxiety   . Depression   . Diabetes mellitus   . Hyperlipidemia   . Hypertension    Past Surgical History:  Procedure Laterality Date  . CHOLECYSTECTOMY    . LUMBAR LAMINECTOMY      reports that she has never smoked. She has never used smokeless tobacco. She reports that she does not drink alcohol or use drugs. family history includes Diabetes in her unknown relative; Heart attack in her mother; Lung cancer in her father. Allergies  Allergen Reactions  . Crestor [Rosuvastatin]     Elevated liver enzymes   Current Outpatient Medications on File Prior to Visit  Medication Sig Dispense Refill  . amLODipine (NORVASC) 10 MG tablet Take 1 tablet (10 mg total) by mouth daily. 90 tablet 3  . aspirin EC 81 MG tablet Take 1 tablet (81 mg total) by mouth daily. 90 tablet 11  . atorvastatin (LIPITOR) 20 MG tablet Take 1 tablet (20 mg total) by mouth daily. 90 tablet 3  . azithromycin (ZITHROMAX Z-PAK) 250 MG tablet 2 tab by mouth day1, then 1 per day 6 tablet 1  . benazepril (LOTENSIN) 40 MG tablet Take 1 tablet (40 mg  total) by mouth daily. 90 tablet 3  . benzonatate (TESSALON) 100 MG capsule Take 1 capsule (100 mg total) by mouth 3 (three) times daily as needed. 20 capsule 0  . cefdinir (OMNICEF) 300 MG capsule Take 1 capsule (300 mg total) by mouth 2 (two) times daily. 20 capsule 0  . fluticasone (FLONASE) 50 MCG/ACT nasal spray Place 2 sprays into both nostrils daily. 16 g 6  . sitaGLIPtin (JANUVIA) 100 MG tablet Take 1 tablet (100 mg total) by mouth daily. 90 tablet 3   No current facility-administered medications on file prior to visit.    Review of Systems  Constitutional: Negative for other unusual diaphoresis or sweats HENT: Negative for ear discharge or swelling Eyes: Negative for other worsening visual disturbances Respiratory: Negative for stridor or other swelling  Gastrointestinal: Negative for worsening distension or other blood Genitourinary: Negative for retention or other urinary change Musculoskeletal: Negative for other MSK pain or swelling Skin: Negative for color change or other new lesions Neurological: Negative for worsening tremors and other numbness  Psychiatric/Behavioral: Negative for worsening agitation or other fatigue All other system neg per pt    Objective:   Physical Exam BP 122/84   Pulse 84   Temp 98.5 F (36.9 C) (Oral)   Ht 5\' 7"  (1.702 m)   Wt 236 lb (107  kg)   LMP 08/06/2015   SpO2 99%   BMI 36.96 kg/m  VS noted,  Constitutional: Pt appears in NAD HENT: Head: NCAT.  Right Ear: External ear normal.  Left Ear: External ear normal.  Eyes: . Pupils are equal, round, and reactive to light. Conjunctivae and EOM are normal Nose: without d/c or deformity Neck: Neck supple. Gross normal ROM Cardiovascular: Normal rate and regular rhythm.   Pulmonary/Chest: Effort normal and breath sounds without rales or wheezing.  Abd:  Soft, NT, ND, + BS, no organomegaly Neurological: Pt is alert. At baseline orientation, motor grossly intact Skin: Skin is warm. No  rashes, other new lesions, no LE edema Psychiatric: Pt behavior is normal without agitation  No other exam findings Lab Results  Component Value Date   WBC 5.3 10/13/2017   HGB 12.8 10/13/2017   HCT 40.7 10/13/2017   PLT 336.0 10/13/2017   GLUCOSE 170 (H) 04/19/2018   CHOL 134 04/19/2018   TRIG 129.0 04/19/2018   HDL 43.70 04/19/2018   LDLDIRECT 137.4 10/20/2009   LDLCALC 64 04/19/2018   ALT 16 04/19/2018   AST 15 04/19/2018   NA 139 04/19/2018   K 4.0 04/19/2018   CL 101 04/19/2018   CREATININE 0.95 04/19/2018   BUN 10 04/19/2018   CO2 30 04/19/2018   TSH 1.56 10/13/2017   HGBA1C 11.4 (H) 04/19/2018   MICROALBUR 9.6 (H) 10/13/2017      Assessment & Plan:

## 2018-04-19 NOTE — Patient Instructions (Addendum)

## 2018-04-20 ENCOUNTER — Telehealth: Payer: Self-pay

## 2018-04-20 NOTE — Telephone Encounter (Signed)
-----   Message from Corwin Levins, MD sent at 04/19/2018  8:11 PM EDT ----- Left message on MyChart, pt to cont same tx except  The test results show that your current treatment is OK, except the A1c is much higher than the most recent.  Please remember to follow the diabetic diet and take all medications.  We should also increase the metformin ER 500 mg to 2 pills per day, as well as increase the glipizide ER 2.5 mg to 5 mg per day, as well as increase the trulicity to 1.5 mg weekly.  I will send the new prescriptions, and you should hear from the office as well.    Shirron to please inform pt, I will do rx x 3

## 2018-04-20 NOTE — Telephone Encounter (Signed)
Pt has been informed of results and expressed understanding.  °

## 2018-04-20 NOTE — Assessment & Plan Note (Signed)
stable overall by history and exam, recent data reviewed with pt, and pt to continue medical treatment as before,  to f/u any worsening symptoms or concerns  

## 2018-05-09 ENCOUNTER — Telehealth: Payer: Self-pay | Admitting: Internal Medicine

## 2018-05-09 MED ORDER — ONETOUCH ULTRASOFT LANCETS MISC: 1.0000 | 2 refills | Status: DC | PRN

## 2018-05-09 NOTE — Telephone Encounter (Signed)
Copied from CRM [id number]. Topic: General - Other >> May 09, 2018  9:51 AM Ronney Lion A wrote: Medication: Lancets Letta Pate ULTRASOFT) lancets   Has the patient contacted their pharmacy? Yes, pt  wasn't sure the exact name of the strips.  Preferred Pharmacy (with phone number or street name):  [id number] [place], [place] - [address] AT [facility] OF [facility] & [address]  [phone] (Phone) [phone] (Fax)    Agent: Please be advised that RX refills may take up to 3 business days. We ask that you follow-up with your pharmacy.

## 2018-05-09 NOTE — Telephone Encounter (Signed)
Refill request that is not on her active medication list.  The lancets or the strips.  PCP:  Jonny Ruiz  See attached

## 2018-05-17 ENCOUNTER — Telehealth: Payer: Self-pay | Admitting: Internal Medicine

## 2018-05-17 NOTE — Telephone Encounter (Signed)
Copied from CRM [id number]. Topic: Quick Communication - Rx Refill/Question >> May 17, 2018  4:48 PM Jessica PontoMcneil, Jessica Daniels wrote: Pt states she has a [facility] doctorTouch Verio Monitor and she needs the test strips  Medication: One Touch Verio test strips  Has the patient contacted their pharmacy? no  Preferred Pharmacy (with phone number or street name): [id number] [place]- [place], [place] - [address] AT [facility] [address] [phone] (Phone)  [phone] (Fax)  Agent: Please be advised that RX refills may take up to 3 business days. We ask that you follow-up with your pharmacy.

## 2018-05-17 NOTE — Telephone Encounter (Signed)
Pt states she has a One Touch Verio Meter and she needs the One Touch Verio test strips ordered. This is a new meter she purchased.   [id number] [place] - [phone] AT [facility] OF [facility] [address] [phone] (Phone) [phone] (Fax)

## 2018-05-18 MED ORDER — ONETOUCH DELICA LANCETS 33G MISC: 5 refills | Status: DC

## 2018-05-18 MED ORDER — GLUCOSE BLOOD VI STRP: ORAL_STRIP | 5 refills | Status: DC

## 2018-05-18 NOTE — Telephone Encounter (Signed)
Reviewed chart pt is up-to-date sent refills to [facility].../lmb  

## 2018-05-30 ENCOUNTER — Ambulatory Visit: Payer: BC Managed Care – PPO | Admitting: Family

## 2018-05-30 ENCOUNTER — Encounter: Payer: Self-pay | Admitting: Family

## 2018-05-30 VITALS — BP 134/82 | HR 75 | Temp 98.0°F | Ht 67.0 in | Wt 238.0 lb

## 2018-05-30 DIAGNOSIS — M545 Low back pain, unspecified: Secondary | ICD-10-CM

## 2018-05-30 MED ORDER — METHOCARBAMOL 500 MG PO TABS: 500.0000 mg | ORAL_TABLET | Freq: Three times a day (TID) | ORAL | 0 refills | Status: DC | PRN

## 2018-05-30 MED ORDER — MELOXICAM 15 MG PO TABS: 15.0000 mg | ORAL_TABLET | Freq: Every day | ORAL | 0 refills | Status: DC

## 2018-05-30 NOTE — Progress Notes (Signed)
Jessica Daniels is a 57 y.o. female with the following history as recorded in EpicCare:  Patient Active Problem List   Diagnosis Date Noted  . Dysuria 03/13/2018  . Cellulitis of right hand 02/09/2018  . Blepharitis of right upper eyelid 02/09/2018  . Fatigue 06/03/2017  . Wheezing 07/13/2016  . Cough 11/13/2015  . Primary osteoarthritis of left knee 10/30/2014  . Diabetes (HCC) 06/04/2014  . Pain in both feet 02/20/2014  . Headache(784.0) 11/01/2013  . Low back pain 01/30/2013  . Preventative health care 01/22/2011  . Palpitations 04/02/2008  . Hyperlipidemia 07/27/2007  . Iron deficiency anemia 04/12/2007  . Anxiety state 04/12/2007  . DEPRESSION 04/12/2007  . ALLERGIC RHINITIS 04/12/2007  . Essential hypertension 02/22/2007    Current Outpatient Medications  Medication Sig Dispense Refill  . amLODipine (NORVASC) 10 MG tablet Take 1 tablet (10 mg total) by mouth daily. 90 tablet 3  . aspirin EC 81 MG tablet Take 1 tablet (81 mg total) by mouth daily. 90 tablet 11  . atorvastatin (LIPITOR) 20 MG tablet Take 1 tablet (20 mg total) by mouth daily. 90 tablet 3  . benazepril (LOTENSIN) 40 MG tablet Take 1 tablet (40 mg total) by mouth daily. 90 tablet 3  . Dulaglutide (TRULICITY) 1.5 MG/0.5ML SOPN Inject 0.5 mLs into the skin once a week. 6 mL 3  . fluticasone (FLONASE) 50 MCG/ACT nasal spray Place 2 sprays into both nostrils daily. 16 g 6  . glipiZIDE (GLUCOTROL XL) 5 MG 24 hr tablet Take 1 tablet (5 mg total) by mouth daily with breakfast. 90 tablet 3  . glucose blood (ONETOUCH VERIO) test strip Use to check blood sugars twice a day 100 each 5  . metFORMIN (GLUCOPHAGE-XR) 500 MG 24 hr tablet Take 2 tablets (1,000 mg total) by mouth daily with breakfast. 180 tablet 3  . ONETOUCH DELICA LANCETS 33G MISC Use to help check blood sugars twice a day 100 each 5  . sitaGLIPtin (JANUVIA) 100 MG tablet Take 1 tablet (100 mg total) by mouth daily. 90 tablet 3  . meloxicam (MOBIC) 15 MG  tablet Take 1 tablet (15 mg total) by mouth daily. 30 tablet 0  . methocarbamol (ROBAXIN) 500 MG tablet Take 1 tablet (500 mg total) by mouth every 8 (eight) hours as needed. 30 tablet 0   No current facility-administered medications for this visit.     Allergies: Crestor [rosuvastatin]  Past Medical History:  Diagnosis Date  . Allergic rhinitis   . Anemia   . Anxiety   . Depression   . Diabetes mellitus   . Hyperlipidemia   . Hypertension     Past Surgical History:  Procedure Laterality Date  . CHOLECYSTECTOMY    . LUMBAR LAMINECTOMY      Family History  Problem Relation Age of Onset  . Diabetes Unknown        1st degree relative  . Heart attack Mother   . Lung cancer Father     Social History   Tobacco Use  . Smoking status: Never Smoker  . Smokeless tobacco: Never Used  Substance Use Topics  . 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  Head: Normocephalic and atraumatic  Lungs: Respirations unlabored; clear to auscultation bilaterally without wheeze, rales, rhonchi  CVS exam: normal rate and regular rhythm.  Musculoskeletal: No deformities; no active joint inflammation  Extremities: No edema, cyanosis, clubbing  Vessels: Symmetric bilaterally  Neurologic: Alert and oriented; speech intact; face symmetrical; moves all extremities well; CNII-XII intact without focal deficit   Assessment:  1. Acute  bilateral low back pain without sciatica     Plan:  Rx for Mobic 15 mg qd, Robaxin 500 mg tid; apply heat, rest and follow-up worse, no better.    No follow-ups on file.  No orders of the defined types were placed in this encounter.   Requested Prescriptions   Signed Prescriptions Disp Refills  . meloxicam (MOBIC) 15 MG tablet 30 tablet 0    Sig: Take 1 tablet (15 mg total) by mouth daily.  . methocarbamol (ROBAXIN) 500 MG tablet 30 tablet 0    Sig: Take 1 tablet (500 mg total) by mouth every 8 (eight) hours as needed.

## 2018-07-21 ENCOUNTER — Ambulatory Visit: Payer: BC Managed Care – PPO | Admitting: Internal Medicine

## 2018-07-25 ENCOUNTER — Other Ambulatory Visit: Payer: Self-pay | Admitting: Orthopedic Surgery

## 2018-07-25 DIAGNOSIS — M545 Low back pain, unspecified: Secondary | ICD-10-CM

## 2018-08-03 ENCOUNTER — Other Ambulatory Visit: Payer: BC Managed Care – PPO

## 2018-10-19 ENCOUNTER — Ambulatory Visit: Payer: BC Managed Care – PPO | Admitting: Internal Medicine

## 2018-10-24 ENCOUNTER — Other Ambulatory Visit: Payer: Self-pay | Admitting: Internal Medicine

## 2018-11-02 ENCOUNTER — Other Ambulatory Visit: Payer: Self-pay | Admitting: Internal Medicine

## 2018-11-03 ENCOUNTER — Ambulatory Visit: Payer: Self-pay | Admitting: *Deleted

## 2018-11-03 ENCOUNTER — Encounter: Payer: Self-pay | Admitting: Internal Medicine

## 2018-11-03 ENCOUNTER — Ambulatory Visit (INDEPENDENT_AMBULATORY_CARE_PROVIDER_SITE_OTHER): Payer: BC Managed Care – PPO | Admitting: Internal Medicine

## 2018-11-03 DIAGNOSIS — F411 Generalized anxiety disorder: Secondary | ICD-10-CM

## 2018-11-03 DIAGNOSIS — I10 Essential (primary) hypertension: Secondary | ICD-10-CM

## 2018-11-03 DIAGNOSIS — E119 Type 2 diabetes mellitus without complications: Secondary | ICD-10-CM | POA: Diagnosis not present

## 2018-11-03 MED ORDER — HYDROCHLOROTHIAZIDE 25 MG PO TABS: 25.0000 mg | ORAL_TABLET | Freq: Every day | ORAL | 3 refills | Status: DC

## 2018-11-03 NOTE — Telephone Encounter (Signed)
Patient is calling from work- she works at [employer]. Patient has had headache for 2 weeks- she had BP checked this morning and it was elevated. She can get off work to do virtual visit- but does not have a way to check her BP at home. She is going to go down to have the nurse check her BP again. Call to office- they will discuss with PCP to see how to handle patient- she is going now to get additional reading for PCP. Patient will expect call back- Contact: Wynetta Emery- [phone]  Reason for Disposition . Systolic BP  >= 160 OR Diastolic >= 100  Answer Assessment - Initial Assessment Questions 1. BLOOD PRESSURE: "What is the blood pressure?" "Did you take at least two measurements 5 minutes apart?"     157/104 2. ONSET: "When did you take your blood pressure?"     6:30 3. HOW: "How did you obtain the blood pressure?" (e.g., visiting nurse, automatic home BP monitor)     cuff 4. HISTORY: "Do you have a history of high blood pressure?"     yes 5. MEDICATIONS: "Are you taking any medications for blood pressure?" "Have you missed any doses recently?"     Yes- no missed doses 6. OTHER SYMPTOMS: "Do you have any symptoms?" (e.g., headache, chest pain, blurred vision, difficulty breathing, weakness)     Headache 2 weeks, light nose bleed- spot with blowing nose 7. PREGNANCY: "Is there any chance you are pregnant?" "When was your last menstrual period?"     n/a  Protocols used: HIGH BLOOD PRESSURE-A-AH

## 2018-11-03 NOTE — Telephone Encounter (Signed)
Called patient at work and never could reach her. I have also called her cell phone and LVM for patient to call back and set up a virtual visit.

## 2018-11-03 NOTE — Telephone Encounter (Signed)
Noted  

## 2018-11-03 NOTE — Patient Instructions (Signed)
Please take all new medication as prescribed - the HCT 25 mg per day  Please continue all other medications as before, and refills have been done if requested.  Please have the pharmacy call with any other refills you may need.  Please continue your efforts at being more active, low cholesterol diabetic diet, and weight control.  Please keep your appointments with your specialists as you may have planned  Please go to the LAB in the Basement (turn left off the elevator) for the tests to be done in 2 weeks

## 2018-11-03 NOTE — Assessment & Plan Note (Signed)
stable overall by history and exam, and pt to continue medical treatment as before,  to f/u any worsening symptoms or concerns 

## 2018-11-03 NOTE — Assessment & Plan Note (Signed)
Mod to severely uncontrolled, for add hct 25 qd, check labs in 2 wks, and f/u BP at home and next visit

## 2018-11-03 NOTE — Assessment & Plan Note (Signed)
stable overall by history and exam, recent data reviewed with pt, and pt to continue medical treatment as before,  to f/u any worsening symptoms or concerns, for f/u a1c with labs 

## 2018-11-03 NOTE — Progress Notes (Signed)
Patient ID: Jessica Daniels, female   DOB: 09/21/1960, 58 y.o.   MRN: [id number]  Virtual Visit via Video Note  I connected with Jessica Daniels on 11/03/18 at  3:40 PM EDT by a video enabled telemedicine application and verified that I am speaking with the correct person using two identifiers.  Location: Patient: at [facility], no others present Provider: at office   I discussed the limitations of evaluation and management by telemedicine and the availability of in person appointments. The patient expressed understanding and agreed to proceed.  History of Present Illness: Here to f/u; overall doing ok,  Pt denies chest pain, increasing sob or doe, wheezing, orthopnea, PND, increased LE swelling, palpitations, dizziness or syncope.  Pt denies new neurological symptoms such as new facial or extremity weakness or numbness.  Pt denies polydipsia, polyuria, or low sugar episode.  Pt states overall good compliance with meds, mostly trying to follow appropriate diet, with wt overall stable,  but little exercise however.  BP have been 170/104 right arm and left arm 178/93 x 2 wks asosciated with HA.  No other new complaints  Denies worsening depressive symptoms, suicidal ideation, or panic BP Readings from Last 3 Encounters:  05/30/18 134/82  04/19/18 122/84  03/13/18 126/84   Past Medical History:  Diagnosis Date  . Allergic rhinitis   . Anemia   . Anxiety   . Depression   . Diabetes mellitus   . Hyperlipidemia   . Hypertension    Past Surgical History:  Procedure Laterality Date  . CHOLECYSTECTOMY    . LUMBAR LAMINECTOMY      reports that she has never smoked. She has never used smokeless tobacco. She reports that she does not drink alcohol or use drugs. family history includes Diabetes in her unknown relative; Heart attack in her mother; Lung cancer in her father. Allergies  Allergen Reactions  . Crestor [Rosuvastatin]     Elevated liver enzymes   Current Outpatient Medications on  File Prior to Visit  Medication Sig Dispense Refill  . amLODipine (NORVASC) 10 MG tablet TAKE 1 TABLET(10 MG) BY MOUTH DAILY 90 tablet 0  . aspirin EC 81 MG tablet Take 1 tablet (81 mg total) by mouth daily. 90 tablet 11  . atorvastatin (LIPITOR) 20 MG tablet TAKE 1 TABLET(20 MG) BY MOUTH DAILY 90 tablet 1  . benazepril (LOTENSIN) 40 MG tablet Take 1 tablet (40 mg total) by mouth daily. 90 tablet 3  . Dulaglutide (TRULICITY) 1.5 MG/0.5ML SOPN Inject 0.5 mLs into the skin once a week. 6 mL 3  . fluticasone (FLONASE) 50 MCG/ACT nasal spray Place 2 sprays into both nostrils daily. 16 g 6  . glipiZIDE (GLUCOTROL XL) 5 MG 24 hr tablet Take 1 tablet (5 mg total) by mouth daily with breakfast. 90 tablet 3  . glucose blood (ONETOUCH VERIO) test strip Use to check blood sugars twice a day 100 each 5  . meloxicam (MOBIC) 15 MG tablet Take 1 tablet (15 mg total) by mouth daily. 30 tablet 0  . metFORMIN (GLUCOPHAGE-XR) 500 MG 24 hr tablet Take 2 tablets (1,000 mg total) by mouth daily with breakfast. 180 tablet 3  . methocarbamol (ROBAXIN) 500 MG tablet Take 1 tablet (500 mg total) by mouth every 8 (eight) hours as needed. 30 tablet 0  . ONETOUCH DELICA LANCETS 33G MISC Use to help check blood sugars twice a day 100 each 5  . sitaGLIPtin (JANUVIA) 100 MG tablet Take 1 tablet (100 mg total) by  mouth daily. 90 tablet 3   No current facility-administered medications on file prior to visit.    Observations/Objective: Alert, NAD, appropriate mood and affect, obese, not ill appearing, cn 2-12 intact, moves all 4s, no visible rash or swelling Lab Results  Component Value Date   WBC 5.3 10/13/2017   HGB 12.8 10/13/2017   HCT 40.7 10/13/2017   PLT 336.0 10/13/2017   GLUCOSE 170 (H) 04/19/2018   CHOL 134 04/19/2018   TRIG 129.0 04/19/2018   HDL 43.70 04/19/2018   LDLDIRECT 137.4 10/20/2009   LDLCALC 64 04/19/2018   ALT 16 04/19/2018   AST 15 04/19/2018   NA 139 04/19/2018   K 4.0 04/19/2018   CL 101  04/19/2018   CREATININE 0.95 04/19/2018   BUN 10 04/19/2018   CO2 30 04/19/2018   TSH 1.56 10/13/2017   HGBA1C 11.4 (H) 04/19/2018   MICROALBUR 9.6 (H) 10/13/2017   Assessment and Plan: See notes  Follow Up Instructions: See notes   I discussed the assessment and treatment plan with the patient. The patient was provided an opportunity to ask questions and all were answered. The patient agreed with the plan and demonstrated an understanding of the instructions.   The patient was advised to call back or seek an in-person evaluation if the symptoms worsen or if the condition fails to improve as anticipated.  Oliver Barre, MD

## 2018-11-29 ENCOUNTER — Other Ambulatory Visit (INDEPENDENT_AMBULATORY_CARE_PROVIDER_SITE_OTHER): Payer: BC Managed Care – PPO

## 2018-11-29 DIAGNOSIS — E119 Type 2 diabetes mellitus without complications: Secondary | ICD-10-CM

## 2018-11-29 LAB — URINALYSIS, ROUTINE W REFLEX MICROSCOPIC
Bilirubin Urine: NEGATIVE
Hgb urine dipstick: NEGATIVE
Ketones, ur: NEGATIVE
Leukocytes,Ua: NEGATIVE
Nitrite: NEGATIVE
RBC / HPF: NONE SEEN (ref 0–?)
Specific Gravity, Urine: 1.025 (ref 1.000–1.030)
Urine Glucose: NEGATIVE
Urobilinogen, UA: 0.2 (ref 0.0–1.0)
pH: 5.5 (ref 5.0–8.0)

## 2018-11-29 LAB — BASIC METABOLIC PANEL
BUN: 14 mg/dL (ref 6–23)
CO2: 32 mEq/L (ref 19–32)
Calcium: 10.4 mg/dL (ref 8.4–10.5)
Chloride: 98 mEq/L (ref 96–112)
Creatinine, Ser: 1.03 mg/dL (ref 0.40–1.20)
GFR: 67.04 mL/min (ref >60.00)
Glucose, Bld: 116 mg/dL — ABNORMAL HIGH (ref 70–99)
Potassium: 3.8 mEq/L (ref 3.5–5.1)
Sodium: 139 mEq/L (ref 135–145)

## 2018-11-29 LAB — CBC WITH DIFFERENTIAL/PLATELET
Basophils Absolute: 0 10*3/uL (ref 0.0–0.1)
Basophils Relative: 0.7 % (ref 0.0–3.0)
Eosinophils Absolute: 0.1 10*3/uL (ref 0.0–0.7)
Eosinophils Relative: 1.1 % (ref 0.0–5.0)
HCT: 41 % (ref 36.0–46.0)
Hemoglobin: 13.1 g/dL (ref 12.0–15.0)
Lymphocytes Relative: 32.1 % (ref 12.0–46.0)
Lymphs Abs: 2 10*3/uL (ref 0.7–4.0)
MCHC: 31.8 g/dL (ref 30.0–36.0)
MCV: 69.2 fl — ABNORMAL LOW (ref 78.0–100.0)
Monocytes Absolute: 0.5 10*3/uL (ref 0.1–1.0)
Monocytes Relative: 7.2 % (ref 3.0–12.0)
Neutro Abs: 3.7 10*3/uL (ref 1.4–7.7)
Neutrophils Relative %: 58.9 % (ref 43.0–77.0)
Platelets: 343 10*3/uL (ref 150.0–400.0)
RBC: 5.92 Mil/uL — ABNORMAL HIGH (ref 3.87–5.11)
RDW: 15.2 % (ref 11.5–15.5)
WBC: 6.3 10*3/uL (ref 4.0–10.5)

## 2018-11-29 LAB — HEPATIC FUNCTION PANEL
ALT: 16 U/L (ref 0–35)
AST: 16 U/L (ref 0–37)
Albumin: 4.6 g/dL (ref 3.5–5.2)
Alkaline Phosphatase: 145 U/L — ABNORMAL HIGH (ref 39–117)
Bilirubin, Direct: 0 mg/dL (ref 0.0–0.3)
Total Bilirubin: 0.5 mg/dL (ref 0.2–1.2)
Total Protein: 8.4 g/dL — ABNORMAL HIGH (ref 6.0–8.3)

## 2018-11-29 LAB — TSH: TSH: 2.39 u[IU]/mL (ref 0.35–4.50)

## 2018-11-29 LAB — LIPID PANEL
Cholesterol: 157 mg/dL (ref 0–200)
HDL: 53 mg/dL (ref >39.00)
LDL Cholesterol: 83 mg/dL (ref 0–99)
NonHDL: 103.69
Total CHOL/HDL Ratio: 3
Triglycerides: 103 mg/dL (ref 0.0–149.0)
VLDL: 20.6 mg/dL (ref 0.0–40.0)

## 2018-11-29 LAB — HEMOGLOBIN A1C: Hgb A1c MFr Bld: 8 % — ABNORMAL HIGH (ref 4.6–6.5)

## 2018-11-29 LAB — MICROALBUMIN / CREATININE URINE RATIO
Creatinine,U: 216.3 mg/dL
Microalb Creat Ratio: 3.7 mg/g (ref 0.0–30.0)
Microalb, Ur: 8.1 mg/dL — ABNORMAL HIGH (ref 0.0–1.9)

## 2018-12-05 ENCOUNTER — Other Ambulatory Visit: Payer: Self-pay | Admitting: Internal Medicine

## 2018-12-11 ENCOUNTER — Encounter: Payer: Self-pay | Admitting: Internal Medicine

## 2018-12-11 ENCOUNTER — Other Ambulatory Visit: Payer: Self-pay

## 2018-12-11 ENCOUNTER — Ambulatory Visit (INDEPENDENT_AMBULATORY_CARE_PROVIDER_SITE_OTHER): Payer: BC Managed Care – PPO | Admitting: Internal Medicine

## 2018-12-11 VITALS — BP 120/80 | HR 85 | Temp 98.1°F | Ht 67.0 in | Wt 237.0 lb

## 2018-12-11 DIAGNOSIS — Z0000 Encounter for general adult medical examination without abnormal findings: Secondary | ICD-10-CM

## 2018-12-11 DIAGNOSIS — E119 Type 2 diabetes mellitus without complications: Secondary | ICD-10-CM | POA: Diagnosis not present

## 2018-12-11 DIAGNOSIS — E538 Deficiency of other specified B group vitamins: Secondary | ICD-10-CM | POA: Diagnosis not present

## 2018-12-11 DIAGNOSIS — E611 Iron deficiency: Secondary | ICD-10-CM

## 2018-12-11 DIAGNOSIS — E559 Vitamin D deficiency, unspecified: Secondary | ICD-10-CM | POA: Diagnosis not present

## 2018-12-11 MED ORDER — METFORMIN HCL 500 MG PO TABS: ORAL_TABLET | ORAL | 3 refills | Status: DC

## 2018-12-11 MED ORDER — GLUCOSE BLOOD VI STRP: ORAL_STRIP | 11 refills | Status: DC

## 2018-12-11 MED ORDER — ATORVASTATIN CALCIUM 20 MG PO TABS: ORAL_TABLET | ORAL | 3 refills | Status: DC

## 2018-12-11 MED ORDER — GLIPIZIDE ER 5 MG PO TB24: 5.0000 mg | ORAL_TABLET | Freq: Every day | ORAL | 3 refills | Status: DC

## 2018-12-11 MED ORDER — METFORMIN HCL 500 MG PO TABS: 500.0000 mg | ORAL_TABLET | Freq: Two times a day (BID) | ORAL | 3 refills | Status: DC

## 2018-12-11 MED ORDER — DULAGLUTIDE 1.5 MG/0.5ML ~~LOC~~ SOAJ: 0.5000 mL | SUBCUTANEOUS | 3 refills | Status: DC

## 2018-12-11 MED ORDER — AMLODIPINE BESYLATE 10 MG PO TABS: ORAL_TABLET | ORAL | 3 refills | Status: DC

## 2018-12-11 NOTE — Assessment & Plan Note (Signed)

## 2018-12-11 NOTE — Progress Notes (Signed)
Subjective:    Patient ID: Jessica Daniels, female    DOB: 1961-06-08, 58 y.o.   MRN: [id number]  HPI  Here for wellness and f/u;  Overall doing ok;  Pt denies Chest pain, worsening SOB, DOE, wheezing, orthopnea, PND, worsening LE edema, palpitations, dizziness or syncope.  Pt denies neurological change such as new headache, facial or extremity weakness.  Pt denies polydipsia, polyuria, or low sugar symptoms. Pt states overall good compliance with treatment and medications, good tolerability, and has been trying to follow appropriate diet.  Pt denies worsening depressive symptoms, suicidal ideation or panic. No fever, night sweats, wt loss, loss of appetite, or other constitutional symptoms.  Pt states good ability with ADL's, has low fall risk, home safety reviewed and adequate, no other significant changes in hearing or vision, and not active with exercise.  No new complaints Past Medical History:  Diagnosis Date   Allergic rhinitis    Anemia    Anxiety    Depression    Diabetes mellitus    Hyperlipidemia    Hypertension    Past Surgical History:  Procedure Laterality Date   CHOLECYSTECTOMY     LUMBAR LAMINECTOMY      reports that she has never smoked. She has never used smokeless tobacco. She reports that she does not drink alcohol or use drugs. family history includes Diabetes in her unknown relative; Heart attack in her mother; Lung cancer in her father. Allergies  Allergen Reactions   Crestor [Rosuvastatin]     Elevated liver enzymes   Current Outpatient Medications on File Prior to Visit  Medication Sig Dispense Refill   aspirin EC 81 MG tablet Take 1 tablet (81 mg total) by mouth daily. 90 tablet 11   benazepril (LOTENSIN) 40 MG tablet TAKE 1 TABLET(40 MG) BY MOUTH DAILY 90 tablet 1   fluticasone (FLONASE) 50 MCG/ACT nasal spray Place 2 sprays into both nostrils daily. 16 g 6   hydrochlorothiazide (HYDRODIURIL) 25 MG tablet Take 1 tablet (25 mg total) by  mouth daily. 90 tablet 3   meloxicam (MOBIC) 15 MG tablet Take 1 tablet (15 mg total) by mouth daily. 30 tablet 0   methocarbamol (ROBAXIN) 500 MG tablet Take 1 tablet (500 mg total) by mouth every 8 (eight) hours as needed. 30 tablet 0   Multiple Vitamin (MULTI-VITAMIN) tablet Take 1 tablet by mouth daily.     ONETOUCH DELICA LANCETS 25K MISC Use to help check blood sugars twice a day 100 each 5   sitaGLIPtin (JANUVIA) 100 MG tablet Take 1 tablet (100 mg total) by mouth daily. 90 tablet 3   No current facility-administered medications on file prior to visit.    Review of Systems Constitutional: Negative for other unusual diaphoresis, sweats, appetite or weight changes HENT: Negative for other worsening hearing loss, ear pain, facial swelling, mouth sores or neck stiffness.   Eyes: Negative for other worsening pain, redness or other visual disturbance.  Respiratory: Negative for other stridor or swelling Cardiovascular: Negative for other palpitations or other chest pain  Gastrointestinal: Negative for worsening diarrhea or loose stools, blood in stool, distention or other pain Genitourinary: Negative for hematuria, flank pain or other change in urine volume.  Musculoskeletal: Negative for myalgias or other joint swelling.  Skin: Negative for other color change, or other wound or worsening drainage.  Neurological: Negative for other syncope or numbness. Hematological: Negative for other adenopathy or swelling Psychiatric/Behavioral: Negative for hallucinations, other worsening agitation, SI, self-injury, or new decreased concentration  All other system neg per pt    Objective:   Physical Exam BP 120/80 (BP Location: Left Arm, Patient Position: Sitting, Cuff Size: Large)    Pulse 85    Temp 98.1 F (36.7 C) (Oral)    Ht 5\' 7"  (1.702 m)    Wt 237 lb (107.5 kg)    LMP 08/06/2015    SpO2 98%    BMI 37.12 kg/m  VS noted,  Constitutional: Pt is oriented to person, place, and time. Appears  well-developed and well-nourished, in no significant distress and comfortable Head: Normocephalic and atraumatic  Eyes: Conjunctivae and EOM are normal. Pupils are equal, round, and reactive to light Right Ear: External ear normal without discharge Left Ear: External ear normal without discharge Nose: Nose without discharge or deformity Mouth/Throat: Oropharynx is without other ulcerations and moist  Neck: Normal range of motion. Neck supple. No JVD present. No tracheal deviation present or significant neck LA or mass Cardiovascular: Normal rate, regular rhythm, normal heart sounds and intact distal pulses.   Pulmonary/Chest: WOB normal and breath sounds without rales or wheezing  Abdominal: Soft. Bowel sounds are normal. NT. No HSM  Musculoskeletal: Normal range of motion. Exhibits no edema Lymphadenopathy: Has no other cervical adenopathy.  Neurological: Pt is alert and oriented to person, place, and time. Pt has normal reflexes. No cranial nerve deficit. Motor grossly intact, Gait intact Skin: Skin is warm and dry. No rash noted or new ulcerations Psychiatric:  Has normal mood and affect. Behavior is normal without agitation No other exam finding Lab Results  Component Value Date   WBC 6.3 11/29/2018   HGB 13.1 11/29/2018   HCT 41.0 11/29/2018   PLT 343.0 11/29/2018   GLUCOSE 116 (H) 11/29/2018   CHOL 157 11/29/2018   TRIG 103.0 11/29/2018   HDL 53.00 11/29/2018   LDLDIRECT 137.4 10/20/2009   LDLCALC 83 11/29/2018   ALT 16 11/29/2018   AST 16 11/29/2018   NA 139 11/29/2018   K 3.8 11/29/2018   CL 98 11/29/2018   CREATININE 1.03 11/29/2018   BUN 14 11/29/2018   CO2 32 11/29/2018   TSH 2.39 11/29/2018   HGBA1C 8.0 (H) 11/29/2018   MICROALBUR 8.1 (H) 11/29/2018      Assessment & Plan:

## 2018-12-11 NOTE — Assessment & Plan Note (Signed)
Mild uncontrolled, to increase metofrmin asd,  to f/u any worsening symptoms or concerns

## 2018-12-11 NOTE — Patient Instructions (Addendum)
Thanks for your inquiry about the concern related to a possible cancer causing contaminant to the metformin ER. -   We will simply need to change your metformin ER to the regular metformin as there has been no concern about this with the regular metformin.  We will send a new prescription, and simply start this when you are able instead,   and we will need to increase the metformin today to 2 of the 500 mg pills in the AM, and 1 in the PM  Please remember to ask your Insurance if the Cologuard is covered  Please continue all other medications as before, and refills have been done if requested.  Please have the pharmacy call with any other refills you may need.  Please continue your efforts at being more active, low cholesterol diet, and weight control.  You are otherwise up to date with prevention measures today.  Please keep your appointments with your specialists as you may have planned  Please return in 6 months, or sooner if needed, with Lab testing done 3-5 days before

## 2019-03-19 ENCOUNTER — Other Ambulatory Visit: Payer: Self-pay

## 2019-03-19 DIAGNOSIS — Z20822 Contact with and (suspected) exposure to covid-19: Secondary | ICD-10-CM

## 2019-03-20 LAB — NOVEL CORONAVIRUS, NAA: SARS-CoV-2, NAA: NOT DETECTED

## 2019-04-11 ENCOUNTER — Other Ambulatory Visit: Payer: Self-pay

## 2019-04-11 DIAGNOSIS — Z20822 Contact with and (suspected) exposure to covid-19: Secondary | ICD-10-CM

## 2019-04-13 LAB — NOVEL CORONAVIRUS, NAA: SARS-CoV-2, NAA: NOT DETECTED

## 2019-05-02 ENCOUNTER — Other Ambulatory Visit: Payer: Self-pay | Admitting: Orthopedic Surgery

## 2019-05-02 DIAGNOSIS — M545 Low back pain, unspecified: Secondary | ICD-10-CM

## 2019-05-10 ENCOUNTER — Ambulatory Visit
Admission: RE | Admit: 2019-05-10 | Discharge: 2019-05-10 | Disposition: A | Discharge status: Home / Self Care | Payer: BC Managed Care – PPO | Source: Ambulatory Visit | Attending: Orthopedic Surgery | Admitting: Orthopedic Surgery

## 2019-05-10 ENCOUNTER — Other Ambulatory Visit: Payer: Self-pay

## 2019-05-10 DIAGNOSIS — M545 Low back pain, unspecified: Secondary | ICD-10-CM

## 2019-06-05 ENCOUNTER — Other Ambulatory Visit: Payer: Self-pay | Admitting: Internal Medicine

## 2019-06-05 MED ORDER — BENAZEPRIL HCL 40 MG PO TABS: ORAL_TABLET | ORAL | 0 refills | Status: DC

## 2019-06-05 NOTE — Telephone Encounter (Signed)
Medication Refill - Medication: benazepril (LOTENSIN) 40 MG tablet  Pt has appt on  12/10.  Preferred Pharmacy:  [facility] [place], [place] - [address] AT [address] [phone] (Phone) [phone] (Fax)     Pt was advised that RX refills may take up to 3 business days. We ask that you follow-up with your pharmacy.

## 2019-06-12 ENCOUNTER — Ambulatory Visit: Payer: BC Managed Care – PPO | Admitting: Internal Medicine

## 2019-06-14 ENCOUNTER — Ambulatory Visit (INDEPENDENT_AMBULATORY_CARE_PROVIDER_SITE_OTHER): Payer: BC Managed Care – PPO | Admitting: Internal Medicine

## 2019-06-14 ENCOUNTER — Other Ambulatory Visit: Payer: Self-pay

## 2019-06-14 ENCOUNTER — Other Ambulatory Visit (INDEPENDENT_AMBULATORY_CARE_PROVIDER_SITE_OTHER): Payer: BC Managed Care – PPO

## 2019-06-14 ENCOUNTER — Other Ambulatory Visit: Payer: Self-pay | Admitting: Internal Medicine

## 2019-06-14 ENCOUNTER — Encounter: Payer: Self-pay | Admitting: Internal Medicine

## 2019-06-14 VITALS — BP 140/88 | HR 80 | Temp 98.1°F | Ht 67.0 in | Wt 234.0 lb

## 2019-06-14 DIAGNOSIS — E538 Deficiency of other specified B group vitamins: Secondary | ICD-10-CM

## 2019-06-14 DIAGNOSIS — D509 Iron deficiency anemia, unspecified: Secondary | ICD-10-CM

## 2019-06-14 DIAGNOSIS — E119 Type 2 diabetes mellitus without complications: Secondary | ICD-10-CM | POA: Diagnosis not present

## 2019-06-14 DIAGNOSIS — E559 Vitamin D deficiency, unspecified: Secondary | ICD-10-CM

## 2019-06-14 DIAGNOSIS — I10 Essential (primary) hypertension: Secondary | ICD-10-CM

## 2019-06-14 DIAGNOSIS — R002 Palpitations: Secondary | ICD-10-CM | POA: Diagnosis not present

## 2019-06-14 DIAGNOSIS — E611 Iron deficiency: Secondary | ICD-10-CM

## 2019-06-14 DIAGNOSIS — E785 Hyperlipidemia, unspecified: Secondary | ICD-10-CM

## 2019-06-14 LAB — CBC WITH DIFFERENTIAL/PLATELET
Basophils Absolute: 0 10*3/uL (ref 0.0–0.1)
Basophils Relative: 0.9 % (ref 0.0–3.0)
Eosinophils Absolute: 0.1 10*3/uL (ref 0.0–0.7)
Eosinophils Relative: 2.1 % (ref 0.0–5.0)
HCT: 40.6 % (ref 36.0–46.0)
Hemoglobin: 12.6 g/dL (ref 12.0–15.0)
Lymphocytes Relative: 30.5 % (ref 12.0–46.0)
Lymphs Abs: 1.6 10*3/uL (ref 0.7–4.0)
MCHC: 31 g/dL (ref 30.0–36.0)
MCV: 69.8 fl — ABNORMAL LOW (ref 78.0–100.0)
Monocytes Absolute: 0.4 10*3/uL (ref 0.1–1.0)
Monocytes Relative: 7.3 % (ref 3.0–12.0)
Neutro Abs: 3.1 10*3/uL (ref 1.4–7.7)
Neutrophils Relative %: 59.2 % (ref 43.0–77.0)
Platelets: 365 10*3/uL (ref 150.0–400.0)
RBC: 5.81 Mil/uL — ABNORMAL HIGH (ref 3.87–5.11)
RDW: 15.9 % — ABNORMAL HIGH (ref 11.5–15.5)
WBC: 5.3 10*3/uL (ref 4.0–10.5)

## 2019-06-14 LAB — T4, FREE: Free T4: 0.79 ng/dL (ref 0.60–1.60)

## 2019-06-14 LAB — HEPATIC FUNCTION PANEL
ALT: 17 U/L (ref 0–35)
AST: 18 U/L (ref 0–37)
Albumin: 4.7 g/dL (ref 3.5–5.2)
Alkaline Phosphatase: 111 U/L (ref 39–117)
Bilirubin, Direct: 0.1 mg/dL (ref 0.0–0.3)
Total Bilirubin: 0.4 mg/dL (ref 0.2–1.2)
Total Protein: 8.1 g/dL (ref 6.0–8.3)

## 2019-06-14 LAB — BASIC METABOLIC PANEL
BUN: 12 mg/dL (ref 6–23)
CO2: 28 mEq/L (ref 19–32)
Calcium: 10.1 mg/dL (ref 8.4–10.5)
Chloride: 101 mEq/L (ref 96–112)
Creatinine, Ser: 1.11 mg/dL (ref 0.40–1.20)
GFR: 61.38 mL/min (ref >60.00)
Glucose, Bld: 113 mg/dL — ABNORMAL HIGH (ref 70–99)
Potassium: 3.8 mEq/L (ref 3.5–5.1)
Sodium: 139 mEq/L (ref 135–145)

## 2019-06-14 LAB — LIPID PANEL
Cholesterol: 168 mg/dL (ref 0–200)
HDL: 44.7 mg/dL (ref >39.00)
LDL Cholesterol: 87 mg/dL (ref 0–99)
NonHDL: 123.74
Total CHOL/HDL Ratio: 4
Triglycerides: 184 mg/dL — ABNORMAL HIGH (ref 0.0–149.0)
VLDL: 36.8 mg/dL (ref 0.0–40.0)

## 2019-06-14 LAB — IBC PANEL
Iron: 106 ug/dL (ref 42–145)
Saturation Ratios: 24 % (ref 20.0–50.0)
Transferrin: 316 mg/dL (ref 212.0–360.0)

## 2019-06-14 LAB — HEMOGLOBIN A1C: Hgb A1c MFr Bld: 8.4 % — ABNORMAL HIGH (ref 4.6–6.5)

## 2019-06-14 LAB — VITAMIN D 25 HYDROXY (VIT D DEFICIENCY, FRACTURES): VITD: 17.04 ng/mL — ABNORMAL LOW (ref 30.00–100.00)

## 2019-06-14 LAB — VITAMIN B12: Vitamin B-12: 302 pg/mL (ref 211–911)

## 2019-06-14 LAB — TSH: TSH: 1.8 u[IU]/mL (ref 0.35–4.50)

## 2019-06-14 MED ORDER — AMLODIPINE BESYLATE 10 MG PO TABS: ORAL_TABLET | ORAL | 3 refills | Status: DC

## 2019-06-14 MED ORDER — VITAMIN D (ERGOCALCIFEROL) 1.25 MG (50000 UNIT) PO CAPS: 50000.0000 [IU] | ORAL_CAPSULE | ORAL | 0 refills | Status: DC

## 2019-06-14 MED ORDER — GLIPIZIDE ER 10 MG PO TB24: 10.0000 mg | ORAL_TABLET | Freq: Every day | ORAL | 3 refills | Status: DC

## 2019-06-14 MED ORDER — TRULICITY 1.5 MG/0.5ML ~~LOC~~ SOAJ: 0.5000 mL | SUBCUTANEOUS | 3 refills | Status: DC

## 2019-06-14 MED ORDER — MELOXICAM 15 MG PO TABS: 15.0000 mg | ORAL_TABLET | Freq: Every day | ORAL | 0 refills | Status: DC

## 2019-06-14 MED ORDER — METFORMIN HCL 500 MG PO TABS: ORAL_TABLET | ORAL | 3 refills | Status: DC

## 2019-06-14 MED ORDER — METHOCARBAMOL 500 MG PO TABS: 500.0000 mg | ORAL_TABLET | Freq: Three times a day (TID) | ORAL | 0 refills | Status: DC | PRN

## 2019-06-14 MED ORDER — BENAZEPRIL HCL 40 MG PO TABS: ORAL_TABLET | ORAL | 0 refills | Status: DC

## 2019-06-14 MED ORDER — HYDROCHLOROTHIAZIDE 25 MG PO TABS: 25.0000 mg | ORAL_TABLET | Freq: Every day | ORAL | 3 refills | Status: DC

## 2019-06-14 MED ORDER — SITAGLIPTIN PHOSPHATE 100 MG PO TABS: 100.0000 mg | ORAL_TABLET | Freq: Every day | ORAL | 3 refills | Status: DC

## 2019-06-14 NOTE — Patient Instructions (Addendum)
Your EKG was OK today  Please continue all other medications as before, and refills have been done if requested.  Please have the pharmacy call with any other refills you may need.  Please continue your efforts at being more active, low cholesterol diet, and weight control.  Please keep your appointments with your specialists as you may have planned  You will be contacted regarding the referral for: Echocardiogram  Please return in 6 months, or sooner if needed

## 2019-06-14 NOTE — Progress Notes (Signed)
Subjective:    Patient ID: Jessica Daniels, female    DOB: 1960-08-22, 58 y.o.   MRN: [id number]  HPI  Here to f/u; overall doing ok,  Pt denies chest pain, increasing sob or doe, wheezing, orthopnea, PND, increased LE swelling, dizziness or syncope, but has had intermittent palpitations worse in the past 2 months.  Pt denies new neurological symptoms such as new headache, or facial or extremity weakness or numbness.  Pt denies polydipsia, polyuria, or low sugar episode.  Pt states overall good compliance with meds, mostly trying to follow appropriate diet, with wt overall stable,  but little exercise however. Pt continues to have recurring lumbar LBP without change in severity, bowel or bladder change, fever, wt loss,  worsening LE pain/numbness/weakness, gait change or falls, except for left sciatica like pain intermittent mild  Maybe several times per wk for several months.   Past Medical History:  Diagnosis Date  . Allergic rhinitis   . Anemia   . Anxiety   . Depression   . Diabetes mellitus   . Hyperlipidemia   . Hypertension    Past Surgical History:  Procedure Laterality Date  . CHOLECYSTECTOMY    . LUMBAR LAMINECTOMY      reports that she has never smoked. She has never used smokeless tobacco. She reports that she does not drink alcohol or use drugs. family history includes Diabetes in her unknown relative; Heart attack in her mother; Lung cancer in her father. Allergies  Allergen Reactions  . Crestor [Rosuvastatin]     Elevated liver enzymes   Current Outpatient Medications on File Prior to Visit  Medication Sig Dispense Refill  . aspirin EC 81 MG tablet Take 1 tablet (81 mg total) by mouth daily. 90 tablet 11  . atorvastatin (LIPITOR) 20 MG tablet TAKE 1 TABLET(20 MG) BY MOUTH DAILY 90 tablet 3  . fluticasone (FLONASE) 50 MCG/ACT nasal spray Place 2 sprays into both nostrils daily. 16 g 6  . glucose blood (ONETOUCH VERIO) test strip Use to check blood sugars twice a day  100 each 11  . Multiple Vitamin (MULTI-VITAMIN) tablet Take 1 tablet by mouth daily.    Glory Rosebush DELICA LANCETS 92J MISC Use to help check blood sugars twice a day 100 each 5   No current facility-administered medications on file prior to visit.   Review of Systems  Constitutional: Negative for other unusual diaphoresis or sweats HENT: Negative for ear discharge or swelling Eyes: Negative for other worsening visual disturbances Respiratory: Negative for stridor or other swelling  Gastrointestinal: Negative for worsening distension or other blood Genitourinary: Negative for retention or other urinary change Musculoskeletal: Negative for other MSK pain or swelling Skin: Negative for color change or other new lesions Neurological: Negative for worsening tremors and other numbness  Psychiatric/Behavioral: Negative for worsening agitation or other fatigue All otherwise neg per pt     Objective:   Physical Exam BP 140/88   Pulse 80   Temp 98.1 F (36.7 C) (Oral)   Ht 5\' 7"  (1.702 m)   Wt 234 lb (106.1 kg)   LMP 08/06/2015   SpO2 98%   BMI 36.65 kg/m  VS noted,  Constitutional: Pt appears in NAD HENT: Head: NCAT.  Right Ear: External ear normal.  Left Ear: External ear normal.  Eyes: . Pupils are equal, round, and reactive to light. Conjunctivae and EOM are normal Nose: without d/c or deformity Neck: Neck supple. Gross normal ROM Cardiovascular: Normal rate and regular rhythm.  Pulmonary/Chest: Effort normal and breath sounds without rales or wheezing.  Abd:  Soft, NT, ND, + BS, no organomegaly Neurological: Pt is alert. At baseline orientation, motor grossly intact Skin: Skin is warm. No rashes, other new lesions, no LE edema Psychiatric: Pt behavior is normal without agitation  All otherwise neg per pt  ECG I have personally interpreted:  NSR non specific st changes  69     Assessment & Plan:

## 2019-06-17 ENCOUNTER — Encounter: Payer: Self-pay | Admitting: Internal Medicine

## 2019-06-17 NOTE — Assessment & Plan Note (Signed)
ecg reviewed, for echo,  to f/u any worsening symptoms or concerns

## 2019-06-17 NOTE — Assessment & Plan Note (Signed)
stable overall by history and exam, recent data reviewed with pt, and pt to continue medical treatment as before,  to f/u any worsening symptoms or concerns  

## 2019-06-17 NOTE — Assessment & Plan Note (Signed)
For fu lab,  to f/u any worsening symptoms or concerns  

## 2019-06-27 ENCOUNTER — Ambulatory Visit (HOSPITAL_COMMUNITY): Payer: BC Managed Care – PPO | Attending: Cardiology

## 2019-06-27 ENCOUNTER — Other Ambulatory Visit: Payer: Self-pay

## 2019-06-27 DIAGNOSIS — R002 Palpitations: Secondary | ICD-10-CM | POA: Diagnosis present

## 2019-07-16 ENCOUNTER — Other Ambulatory Visit: Payer: Self-pay

## 2019-07-16 MED ORDER — MELOXICAM 15 MG PO TABS: 15.0000 mg | ORAL_TABLET | Freq: Every day | ORAL | 0 refills | Status: DC

## 2019-09-02 ENCOUNTER — Other Ambulatory Visit: Payer: Self-pay | Admitting: Internal Medicine

## 2019-09-02 NOTE — Telephone Encounter (Signed)
No need for further high dose vit d  Please change to OTC Vitamin D3 at 2000 units per day, indefinitely.

## 2019-10-19 ENCOUNTER — Other Ambulatory Visit: Payer: Self-pay | Admitting: Internal Medicine

## 2019-11-26 ENCOUNTER — Other Ambulatory Visit: Payer: Self-pay | Admitting: Internal Medicine

## 2019-11-30 ENCOUNTER — Other Ambulatory Visit: Payer: Self-pay | Admitting: Internal Medicine

## 2019-11-30 NOTE — Telephone Encounter (Signed)
Please refill as per office routine med refill policy (all routine meds refilled for 3 mo or monthly per pt preference up to one year from last visit, then month to month grace period for 3 mo, then further med refills will have to be denied)  

## 2019-12-13 ENCOUNTER — Encounter: Payer: Self-pay | Admitting: Internal Medicine

## 2019-12-13 ENCOUNTER — Ambulatory Visit: Payer: BC Managed Care – PPO | Admitting: Internal Medicine

## 2019-12-13 ENCOUNTER — Other Ambulatory Visit: Payer: Self-pay

## 2019-12-13 VITALS — BP 122/78 | HR 83 | Temp 97.7°F | Ht 67.0 in | Wt 236.0 lb

## 2019-12-13 DIAGNOSIS — M7062 Trochanteric bursitis, left hip: Secondary | ICD-10-CM

## 2019-12-13 DIAGNOSIS — E611 Iron deficiency: Secondary | ICD-10-CM

## 2019-12-13 DIAGNOSIS — E538 Deficiency of other specified B group vitamins: Secondary | ICD-10-CM | POA: Diagnosis not present

## 2019-12-13 DIAGNOSIS — E559 Vitamin D deficiency, unspecified: Secondary | ICD-10-CM | POA: Diagnosis not present

## 2019-12-13 DIAGNOSIS — D509 Iron deficiency anemia, unspecified: Secondary | ICD-10-CM

## 2019-12-13 DIAGNOSIS — R252 Cramp and spasm: Secondary | ICD-10-CM

## 2019-12-13 DIAGNOSIS — Z0000 Encounter for general adult medical examination without abnormal findings: Secondary | ICD-10-CM

## 2019-12-13 DIAGNOSIS — E119 Type 2 diabetes mellitus without complications: Secondary | ICD-10-CM

## 2019-12-13 DIAGNOSIS — I10 Essential (primary) hypertension: Secondary | ICD-10-CM

## 2019-12-13 DIAGNOSIS — Z0001 Encounter for general adult medical examination with abnormal findings: Secondary | ICD-10-CM

## 2019-12-13 DIAGNOSIS — E785 Hyperlipidemia, unspecified: Secondary | ICD-10-CM

## 2019-12-13 NOTE — Progress Notes (Signed)
Subjective:    Patient ID: Jessica Daniels, female    DOB: May 16, 1961, 59 y.o.   MRN: [id number]  HPI  Here for wellness and f/u;  Overall doing ok;  Pt denies Chest pain, worsening SOB, DOE, wheezing, orthopnea, PND, worsening LE edema, palpitations, dizziness or syncope.  Pt denies neurological change such as new headache, facial or extremity weakness.  Pt denies polydipsia, polyuria, or low sugar symptoms. Pt states overall good compliance with treatment and medications, good tolerability, and has been trying to follow appropriate diet.  Pt denies worsening depressive symptoms, suicidal ideation or panic. No fever, night sweats, wt loss, loss of appetite, or other constitutional symptoms.  Pt states good ability with ADL's, has low fall risk, home safety reviewed and adequate, no other significant changes in hearing or vision, and only occasionally active with exercise  Plans to have eye doctor appt soon Also c/o left lateral hip tender pain sharp intermittent mild to mod worse to lie on left side at night, better to not do this, worse to walk sometimes, for over 1 mo.  Also has intermittent bilteral leg cramps worse at night despite drinking plenty of fluids and non overexertion.   Past Medical History:  Diagnosis Date  . Allergic rhinitis   . Anemia   . Anxiety   . Depression   . Diabetes mellitus   . Hyperlipidemia   . Hypertension    Past Surgical History:  Procedure Laterality Date  . CHOLECYSTECTOMY    . LUMBAR LAMINECTOMY      reports that she has never smoked. She has never used smokeless tobacco. She reports that she does not drink alcohol and does not use drugs. family history includes Diabetes in her unknown relative; Heart attack in her mother; Lung cancer in her father. Allergies  Allergen Reactions  . Crestor [Rosuvastatin]     Elevated liver enzymes   Current Outpatient Medications on File Prior to Visit  Medication Sig Dispense Refill  . amLODipine (NORVASC) 10 MG  tablet TAKE 1 TABLET(10 MG) BY MOUTH DAILY 90 tablet 3  . aspirin EC 81 MG tablet Take 1 tablet (81 mg total) by mouth daily. 90 tablet 11  . atorvastatin (LIPITOR) 20 MG tablet TAKE 1 TABLET(20 MG) BY MOUTH DAILY 90 tablet 3  . benazepril (LOTENSIN) 40 MG tablet TAKE 1 TABLET(40 MG) BY MOUTH DAILY 90 tablet 0  . diclofenac (VOLTAREN) 75 MG EC tablet Take 75 mg by mouth 2 (two) times daily as needed.    . Dulaglutide (TRULICITY) 1.5 MG/0.5ML SOPN Inject 0.5 mLs into the skin once a week. 6 mL 3  . fluticasone (FLONASE) 50 MCG/ACT nasal spray Place 2 sprays into both nostrils daily. 16 g 6  . glipiZIDE (GLUCOTROL XL) 10 MG 24 hr tablet Take 1 tablet (10 mg total) by mouth daily with breakfast. 90 tablet 3  . glucose blood (ONETOUCH VERIO) test strip Use to check blood sugars twice a day 100 each 11  . hydrochlorothiazide (HYDRODIURIL) 25 MG tablet Take 1 tablet (25 mg total) by mouth daily. Annual appt due in June must see provider for future refills 90 tablet 0  . meloxicam (MOBIC) 15 MG tablet Take 1 tablet (15 mg total) by mouth daily. 30 tablet 0  . metFORMIN (GLUCOPHAGE) 500 MG tablet TAKE 1 TABLET(500 MG) BY MOUTH TWICE DAILY WITH A MEAL 180 tablet 0  . methocarbamol (ROBAXIN) 500 MG tablet Take 1 tablet (500 mg total) by mouth every 8 (eight) hours  as needed. 30 tablet 0  . Multiple Vitamin (MULTI-VITAMIN) tablet Take 1 tablet by mouth daily.    Glory Rosebush DELICA LANCETS 46T MISC Use to help check blood sugars twice a day 100 each 5  . sitaGLIPtin (JANUVIA) 100 MG tablet Take 1 tablet (100 mg total) by mouth daily. 90 tablet 3  . Vitamin D, Ergocalciferol, (DRISDOL) 1.25 MG (50000 UT) CAPS capsule Take 1 capsule (50,000 Units total) by mouth every 7 (seven) days. 12 capsule 0   No current facility-administered medications on file prior to visit.   Review of Systems All otherwise neg per pt    Objective:   Physical Exam BP 122/78 (BP Location: Left Arm, Patient Position: Sitting, Cuff  Size: Large)   Pulse 83   Temp 97.7 F (36.5 C) (Oral)   Ht 5\' 7"  (1.702 m)   Wt 236 lb (107 kg)   LMP 08/06/2015   SpO2 97%   BMI 36.96 kg/m  VS noted,  Constitutional: Pt appears in NAD HENT: Head: NCAT.  Right Ear: External ear normal.  Left Ear: External ear normal.  Eyes: . Pupils are equal, round, and reactive to light. Conjunctivae and EOM are normal Nose: without d/c or deformity Neck: Neck supple. Gross normal ROM Cardiovascular: Normal rate and regular rhythm.   Pulmonary/Chest: Effort normal and breath sounds without rales or wheezing.  Abd:  Soft, NT, ND, + BS, no organomegaly Tender over left lateral greater trochanter Neurological: Pt is alert. At baseline orientation, motor grossly intact Skin: Skin is warm. No rashes, other new lesions, no LE edema Psychiatric: Pt behavior is normal without agitation  All otherwise neg per pt Lab Results  Component Value Date   WBC 5.3 06/14/2019   HGB 12.6 06/14/2019   HCT 40.6 06/14/2019   PLT 365.0 06/14/2019   GLUCOSE 113 (H) 06/14/2019   CHOL 168 06/14/2019   TRIG 184.0 (H) 06/14/2019   HDL 44.70 06/14/2019   LDLDIRECT 137.4 10/20/2009   LDLCALC 87 06/14/2019   ALT 17 06/14/2019   AST 18 06/14/2019   NA 139 06/14/2019   K 3.8 06/14/2019   CL 101 06/14/2019   CREATININE 1.11 06/14/2019   BUN 12 06/14/2019   CO2 28 06/14/2019   TSH 1.80 06/14/2019   HGBA1C 8.4 (H) 06/14/2019   MICROALBUR 8.1 (H) 11/29/2018      Assessment & Plan:

## 2019-12-13 NOTE — Patient Instructions (Signed)
Please continue all other medications as before, and refills have been done if requested.  Please have the pharmacy call with any other refills you may need.  Please continue your efforts at being more active, low cholesterol diet, and weight control.  You are otherwise up to date with prevention measures today.  Please keep your appointments with your specialists as you may have planned  Please go to the LAB at the blood drawing area for the tests to be done - tomorrow at the [facility] lab  You will be contacted by phone if any changes need to be made immediately.  Otherwise, you will receive a letter about your results with an explanation, but please check with MyChart first.  Please remember to sign up for MyChart if you have not done so, as this will be important to you in the future with finding out test results, communicating by private email, and scheduling acute appointments online when needed.  Please make an Appointment to return in 6 months, or sooner if needed

## 2019-12-14 ENCOUNTER — Other Ambulatory Visit: Payer: BC Managed Care – PPO

## 2019-12-14 ENCOUNTER — Other Ambulatory Visit: Payer: Self-pay | Admitting: Internal Medicine

## 2019-12-14 LAB — CBC WITH DIFFERENTIAL/PLATELET
Basophils Absolute: 0 10*3/uL (ref 0.0–0.1)
Basophils Relative: 0.4 % (ref 0.0–3.0)
Eosinophils Absolute: 0.1 10*3/uL (ref 0.0–0.7)
Eosinophils Relative: 1.7 % (ref 0.0–5.0)
HCT: 38.2 % (ref 36.0–46.0)
Hemoglobin: 11.9 g/dL — ABNORMAL LOW (ref 12.0–15.0)
Lymphocytes Relative: 32.9 % (ref 12.0–46.0)
Lymphs Abs: 2.1 10*3/uL (ref 0.7–4.0)
MCHC: 31.2 g/dL (ref 30.0–36.0)
MCV: 70.7 fl — ABNORMAL LOW (ref 78.0–100.0)
Monocytes Absolute: 0.4 10*3/uL (ref 0.1–1.0)
Monocytes Relative: 6.8 % (ref 3.0–12.0)
Neutro Abs: 3.7 10*3/uL (ref 1.4–7.7)
Neutrophils Relative %: 58.2 % (ref 43.0–77.0)
Platelets: 347 10*3/uL (ref 150.0–400.0)
RBC: 5.4 Mil/uL — ABNORMAL HIGH (ref 3.87–5.11)
RDW: 16.3 % — ABNORMAL HIGH (ref 11.5–15.5)
WBC: 6.4 10*3/uL (ref 4.0–10.5)

## 2019-12-14 LAB — TSH: TSH: 2.43 u[IU]/mL (ref 0.35–4.50)

## 2019-12-14 LAB — MICROALBUMIN / CREATININE URINE RATIO
Creatinine,U: 95.5 mg/dL
Microalb Creat Ratio: 1 mg/g (ref 0.0–30.0)
Microalb, Ur: 1 mg/dL (ref 0.0–1.9)

## 2019-12-14 LAB — BASIC METABOLIC PANEL
BUN: 15 mg/dL (ref 6–23)
CO2: 30 mEq/L (ref 19–32)
Calcium: 9.8 mg/dL (ref 8.4–10.5)
Chloride: 98 mEq/L (ref 96–112)
Creatinine, Ser: 1.31 mg/dL — ABNORMAL HIGH (ref 0.40–1.20)
GFR: 50.6 mL/min — ABNORMAL LOW (ref >60.00)
Glucose, Bld: 265 mg/dL — ABNORMAL HIGH (ref 70–99)
Potassium: 3.7 mEq/L (ref 3.5–5.1)
Sodium: 135 mEq/L (ref 135–145)

## 2019-12-14 LAB — URINALYSIS, ROUTINE W REFLEX MICROSCOPIC
Bilirubin Urine: NEGATIVE
Hgb urine dipstick: NEGATIVE
Ketones, ur: NEGATIVE
Leukocytes,Ua: NEGATIVE
Nitrite: NEGATIVE
RBC / HPF: NONE SEEN (ref 0–?)
Specific Gravity, Urine: 1.025 (ref 1.000–1.030)
Total Protein, Urine: NEGATIVE
Urine Glucose: 500 — AB
Urobilinogen, UA: 0.2 (ref 0.0–1.0)
WBC, UA: NONE SEEN (ref 0–?)
pH: 5.5 (ref 5.0–8.0)

## 2019-12-14 LAB — IBC PANEL
Iron: 88 ug/dL (ref 42–145)
Saturation Ratios: 20 % (ref 20.0–50.0)
Transferrin: 314 mg/dL (ref 212.0–360.0)

## 2019-12-14 LAB — HEPATIC FUNCTION PANEL
ALT: 25 U/L (ref 0–35)
AST: 21 U/L (ref 0–37)
Albumin: 4.4 g/dL (ref 3.5–5.2)
Alkaline Phosphatase: 120 U/L — ABNORMAL HIGH (ref 39–117)
Bilirubin, Direct: 0.1 mg/dL (ref 0.0–0.3)
Total Bilirubin: 0.5 mg/dL (ref 0.2–1.2)
Total Protein: 7.5 g/dL (ref 6.0–8.3)

## 2019-12-14 LAB — FERRITIN: Ferritin: 96.6 ng/mL (ref 10.0–291.0)

## 2019-12-14 LAB — LIPID PANEL
Cholesterol: 143 mg/dL (ref 0–200)
HDL: 38.8 mg/dL — ABNORMAL LOW (ref >39.00)
LDL Cholesterol: 69 mg/dL (ref 0–99)
NonHDL: 104.25
Total CHOL/HDL Ratio: 4
Triglycerides: 175 mg/dL — ABNORMAL HIGH (ref 0.0–149.0)
VLDL: 35 mg/dL (ref 0.0–40.0)

## 2019-12-14 LAB — VITAMIN B12: Vitamin B-12: 598 pg/mL (ref 211–911)

## 2019-12-14 LAB — VITAMIN D 25 HYDROXY (VIT D DEFICIENCY, FRACTURES): VITD: 28.48 ng/mL — ABNORMAL LOW (ref 30.00–100.00)

## 2019-12-14 NOTE — Telephone Encounter (Signed)
Please refill as per office routine med refill policy (all routine meds refilled for 3 mo or monthly per pt preference up to one year from last visit, then month to month grace period for 3 mo, then further med refills will have to be denied)  

## 2019-12-16 ENCOUNTER — Encounter: Payer: Self-pay | Admitting: Internal Medicine

## 2019-12-16 DIAGNOSIS — R252 Cramp and spasm: Secondary | ICD-10-CM | POA: Insufficient documentation

## 2019-12-16 DIAGNOSIS — M7072 Other bursitis of hip, left hip: Secondary | ICD-10-CM | POA: Insufficient documentation

## 2019-12-16 NOTE — Assessment & Plan Note (Signed)
stable overall by history and exam, recent data reviewed with pt, and pt to continue medical treatment as before,  to f/u any worsening symptoms or concerns  

## 2019-12-16 NOTE — Assessment & Plan Note (Signed)

## 2019-12-16 NOTE — Assessment & Plan Note (Signed)
Etiology unclear, for tonic water prn

## 2019-12-16 NOTE — Assessment & Plan Note (Signed)
Cont oral replacement 

## 2019-12-16 NOTE — Assessment & Plan Note (Signed)
No overt bleeding, for f/u lab 

## 2019-12-16 NOTE — Assessment & Plan Note (Addendum)
Mild to mod, for sport med referral,  to f/u any worsening symptoms or concerns  I spent 31 minutes in preparing to see the patient by review of recent labs, imaging and procedures, obtaining and reviewing separately obtained history, communicating with the patient and family or caregiver, ordering medications, tests or procedures, and documenting clinical information in the EHR including the differential Dx, treatment, and any further evaluation and other management ofleft hip bursitis, leg cramps, dm, htn, hld, iron def anemia, vit d deficieicy

## 2019-12-17 LAB — HEMOGLOBIN A1C: Hgb A1c MFr Bld: 8.4 % — ABNORMAL HIGH (ref 4.6–6.5)

## 2019-12-19 ENCOUNTER — Other Ambulatory Visit: Payer: Self-pay | Admitting: Internal Medicine

## 2019-12-19 MED ORDER — VITAMIN D (ERGOCALCIFEROL) 1.25 MG (50000 UNIT) PO CAPS: 50000.0000 [IU] | ORAL_CAPSULE | ORAL | 0 refills | Status: DC

## 2019-12-19 MED ORDER — METFORMIN HCL 1000 MG PO TABS: 1000.0000 mg | ORAL_TABLET | Freq: Two times a day (BID) | ORAL | 3 refills | Status: DC

## 2019-12-30 ENCOUNTER — Other Ambulatory Visit: Payer: Self-pay | Admitting: Internal Medicine

## 2019-12-30 NOTE — Telephone Encounter (Signed)
Please refill as per office routine med refill policy (all routine meds refilled for 3 mo or monthly per pt preference up to one year from last visit, then month to month grace period for 3 mo, then further med refills will have to be denied)  

## 2020-02-18 ENCOUNTER — Other Ambulatory Visit: Payer: Self-pay | Admitting: Internal Medicine

## 2020-02-18 NOTE — Telephone Encounter (Signed)
Please refill as per office routine med refill policy (all routine meds refilled for 3 mo or monthly per pt preference up to one year from last visit, then month to month grace period for 3 mo, then further med refills will have to be denied)  

## 2020-03-01 ENCOUNTER — Other Ambulatory Visit: Payer: Self-pay | Admitting: Internal Medicine

## 2020-03-02 NOTE — Telephone Encounter (Signed)
Please refill as per office routine med refill policy (all routine meds refilled for 3 mo or monthly per pt preference up to one year from last visit, then month to month grace period for 3 mo, then further med refills will have to be denied)  

## 2020-03-21 ENCOUNTER — Other Ambulatory Visit: Payer: Self-pay | Admitting: Internal Medicine

## 2020-03-21 NOTE — Telephone Encounter (Signed)
Please change to OTC Vitamin D3 at 2000 units per day, indefinitely.  

## 2020-06-16 ENCOUNTER — Telehealth: Payer: Self-pay | Admitting: Internal Medicine

## 2020-06-16 ENCOUNTER — Other Ambulatory Visit: Payer: Self-pay

## 2020-06-16 NOTE — Telephone Encounter (Signed)
Patient called and was wondering if Dr. Jonny Ruiz could sign the prior auth for benazepril (LOTENSIN) 40 MG tablet

## 2020-06-17 ENCOUNTER — Encounter: Payer: Self-pay | Admitting: Internal Medicine

## 2020-06-17 ENCOUNTER — Ambulatory Visit (INDEPENDENT_AMBULATORY_CARE_PROVIDER_SITE_OTHER): Payer: BC Managed Care – PPO | Admitting: Internal Medicine

## 2020-06-17 VITALS — BP 118/78 | HR 90 | Temp 97.8°F | Ht 67.0 in | Wt 228.0 lb

## 2020-06-17 DIAGNOSIS — D509 Iron deficiency anemia, unspecified: Secondary | ICD-10-CM | POA: Diagnosis not present

## 2020-06-17 DIAGNOSIS — E559 Vitamin D deficiency, unspecified: Secondary | ICD-10-CM | POA: Diagnosis not present

## 2020-06-17 DIAGNOSIS — E1165 Type 2 diabetes mellitus with hyperglycemia: Secondary | ICD-10-CM | POA: Diagnosis not present

## 2020-06-17 DIAGNOSIS — E785 Hyperlipidemia, unspecified: Secondary | ICD-10-CM

## 2020-06-17 DIAGNOSIS — I10 Essential (primary) hypertension: Secondary | ICD-10-CM

## 2020-06-17 NOTE — Progress Notes (Deleted)
   Subjective:    Patient ID: Jessica Daniels, female    DOB: 03-24-1961, 59 y.o.   MRN: [id number]  HPI   Plans to have booster soon.   Wt Readings from Last 3 Encounters:  06/17/20 228 lb (103.4 kg)  12/13/19 236 lb (107 kg)  06/14/19 234 lb (106.1 kg)      Review of Systems     Objective:   Physical Exam        Assessment & Plan:

## 2020-06-17 NOTE — Patient Instructions (Signed)
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  Please have the pharmacy call with any other refills you may need.  Please continue your efforts at being more active, low cholesterol diet, and weight control.  Please keep your appointments with your specialists as you may have planned  You will be contacted regarding the referral for: endocrinology  Please make an Appointment to return in 6 months, or sooner if needed, also with Lab Appointment for testing done 3-5 days before at the FIRST FLOOR Lab (so this is for TWO appointments - please see the scheduling desk as you leave)  Due to the ongoing Covid 19 pandemic, our lab now requires an appointment for any labs done at our office.  If you need labs done and do not have an appointment, please call our office ahead of time to schedule before presenting to the lab for your testing.

## 2020-06-19 IMAGING — MR MR LUMBAR SPINE W/O CM
4 of 5 series · 25 of 48 positions shown · non-contrast
Comparison: Lumbar radiographs dated 10/30/2014

CLINICAL DATA: Progressive right lower extremity pain.

EXAM:
MRI LUMBAR SPINE WITHOUT CONTRAST
TECHNIQUE: Multiplanar, multisequence MR imaging of the lumbar spine was
performed. No intravenous contrast was administered.

[Series 2: T2 · sagittal · 4.0mm · 0.55mm/px · 6 of 16 slices shown (1 of 2)]
[im 1/16]
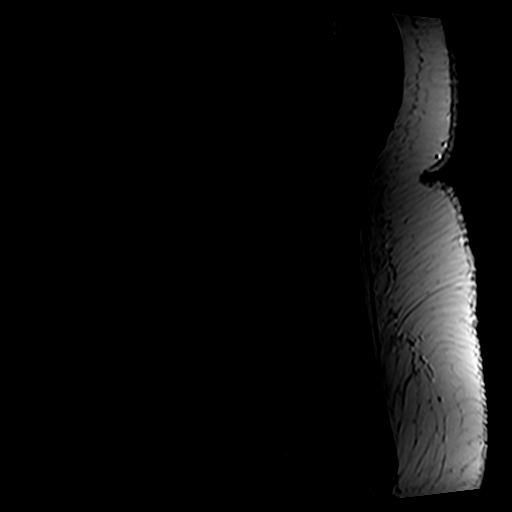
[im 4/16]
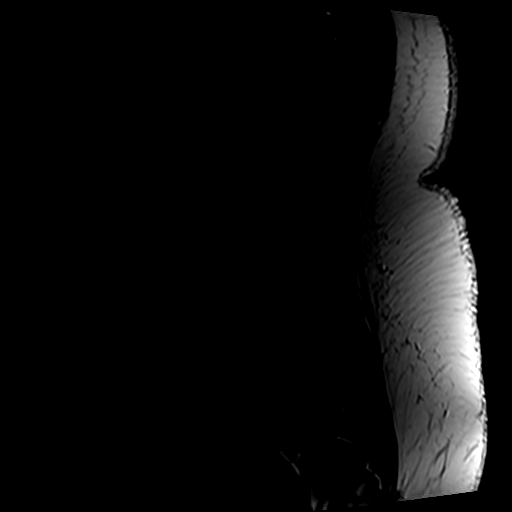
[im 7/16]
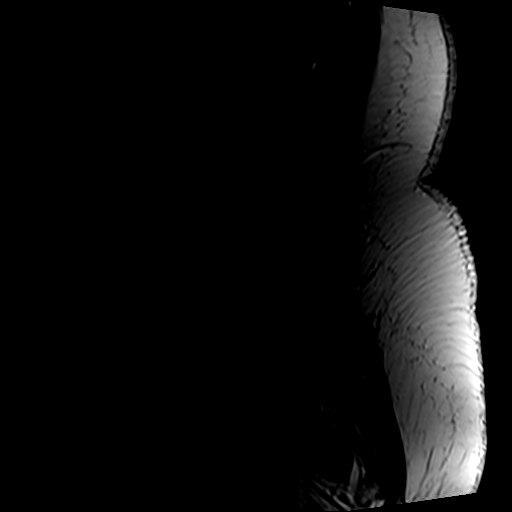
[im 10/16]
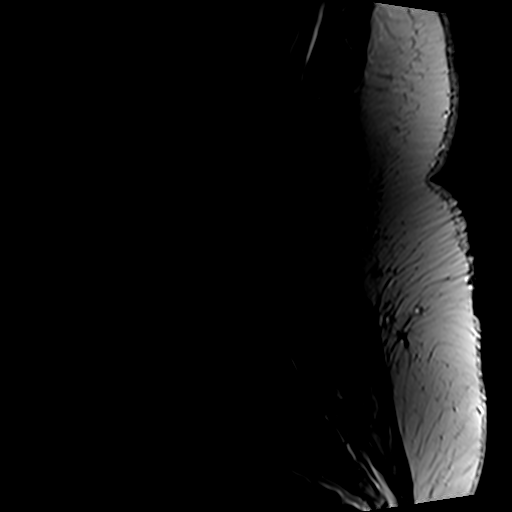
[im 13/16]
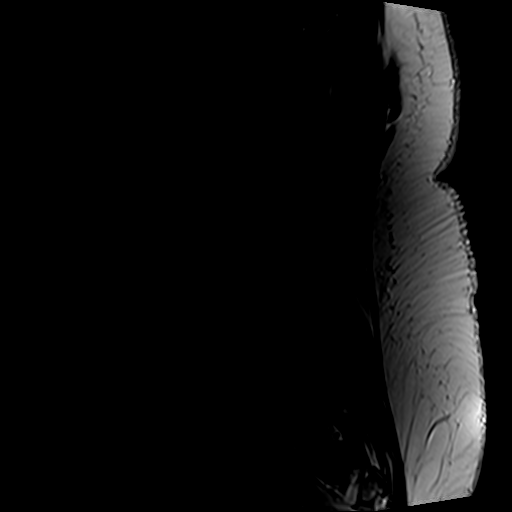
[im 16/16]
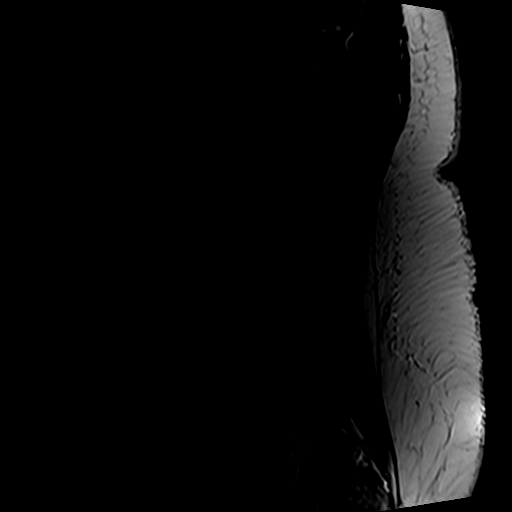

[Series 4: T1 · sagittal · 4.0mm · 0.55mm/px · 6 of 16 slices shown (1 of 2)]
[im 1/16]
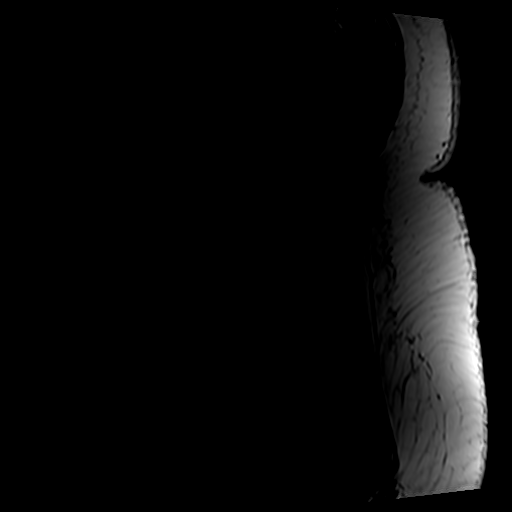
[im 4/16]
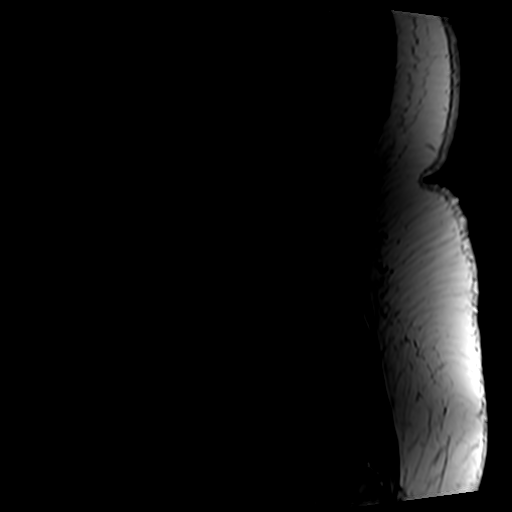
[im 7/16]
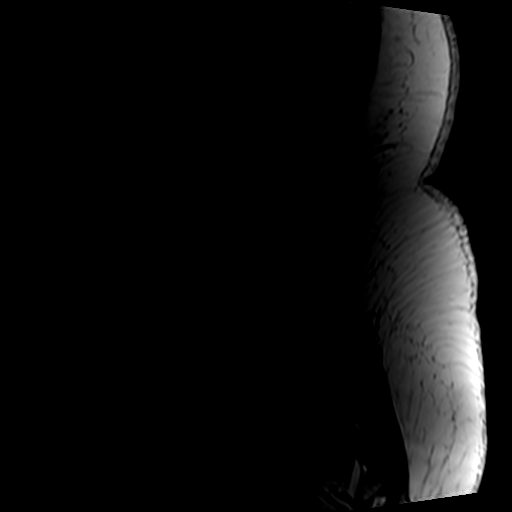
[im 10/16]
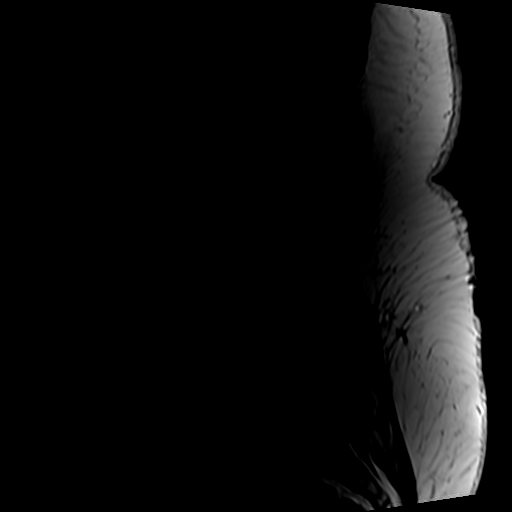
[im 13/16]
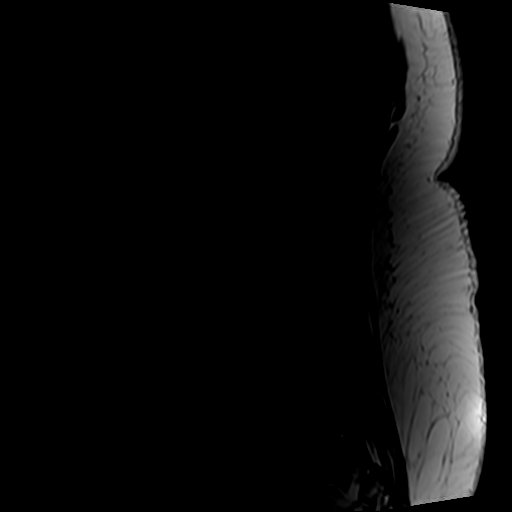
[im 16/16]
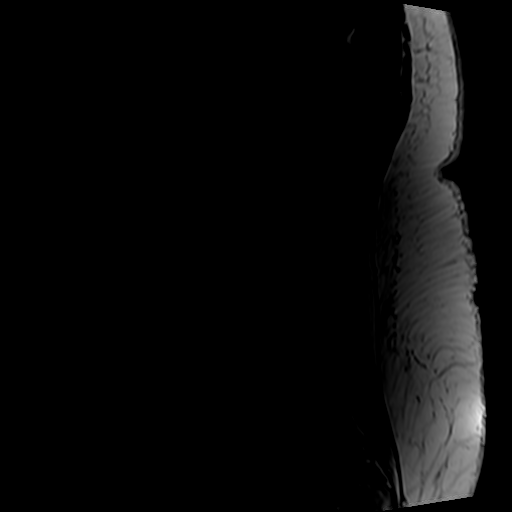

[Series 5: T2 · axial · 4.0mm · 0.70mm/px · z∈[-82,+131]mm · 9 of 38 slices shown (2 of 2)]
[im 1/38]
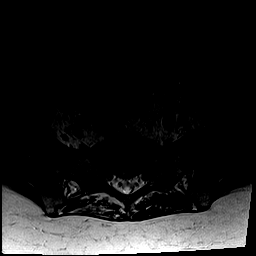
[im 6/38]
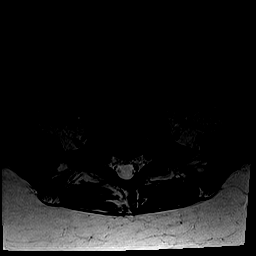
[im 11/38]
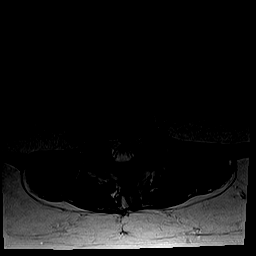
[im 16/38]
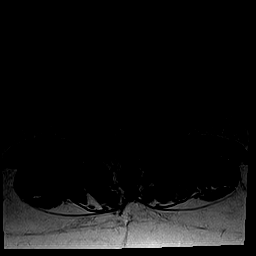
[im 19/38]
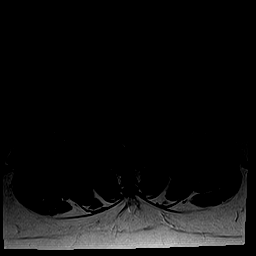
[im 22/38]
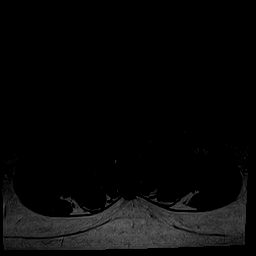
[im 27/38]
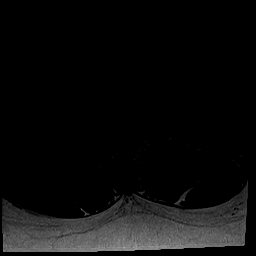
[im 32/38]
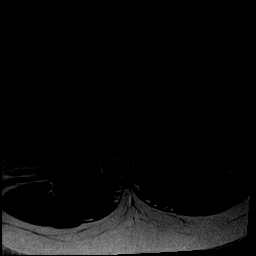
[im 38/38]
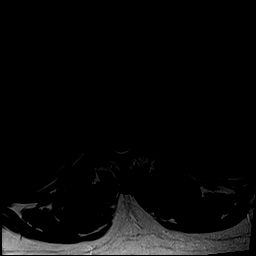

[Series 6: T1 · axial · 4.0mm · 0.35mm/px · z∈[-82,+99]mm · 4 of 38 slices shown (2 of 2)]
[im 1/38]
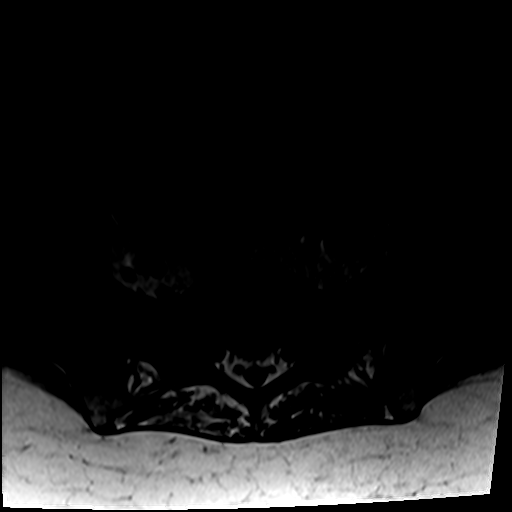
[im 6/38]
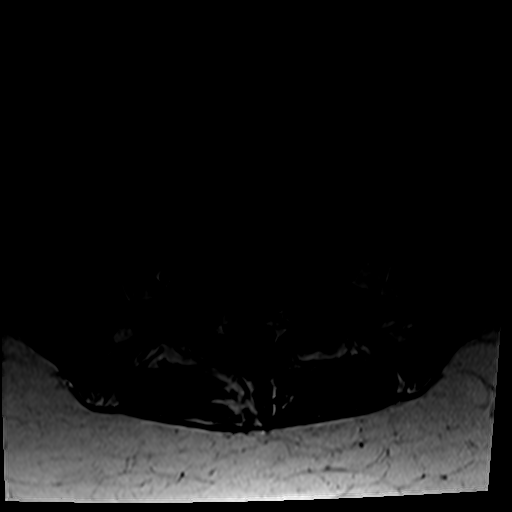
[im 19/38]
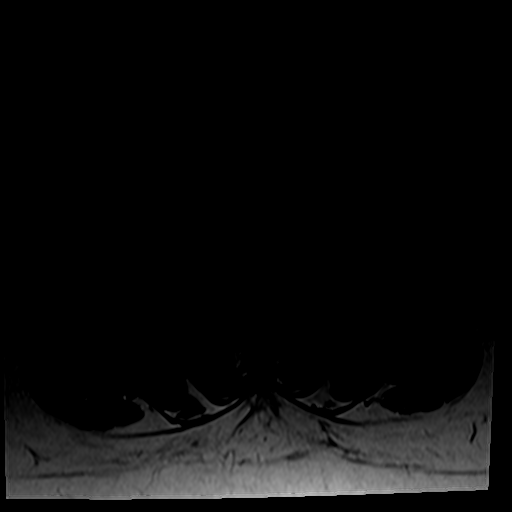
[im 32/38]
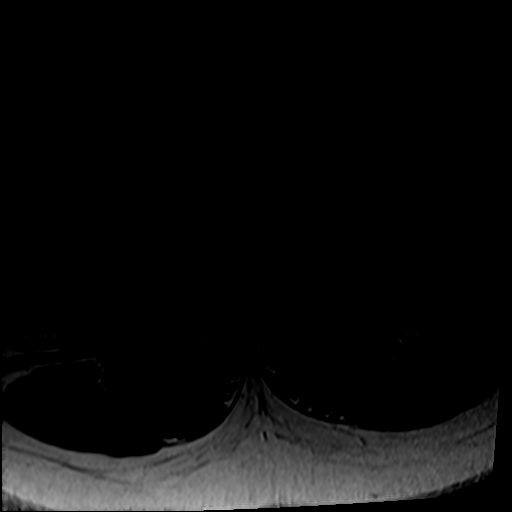

[25 of 48 positions shown; findings below may reference images not displayed]

FINDINGS: Segmentation:  Standard.

Alignment:  Physiologic.

Vertebrae: Vestigial disc at S1-2. No fractures or bone destruction.
No discitis.

Conus medullaris and cauda equina: Conus extends to the L1-2 level.
Conus and cauda equina appear normal.

Paraspinal and other soft tissues: Negative.

Disc levels:

T12-L1: Normal.

L1-2: Normal.

L2-3: Tiny disc bulge into the inferior aspect of the right neural
foramen without neural impingement. Otherwise negative.

L3-4: Tiny broad-based disc bulge with no impingement.

L4-5: Severe bilateral facet arthritis with marked hypertrophy of
the facet joints. Small broad-based bulge of the uncovered disc with
a small soft disc protrusion into the inferior aspect of the right
neural foramen. The exiting L4 nerves are not impinged upon. The
thecal sac is slightly compressed below the level of the disc space
by the hypertrophied ligamentum flavum and facet joints.

L5-S1: Marked chronic disc space narrowing. Degenerative changes of
the vertebral endplates. Osteophytes fuse the left-side of L5 and S1
segments. No foraminal stenosis. No neural impingement. Evidence of
previous right laminectomy.
IMPRESSION: 1. Severe bilateral facet arthritis at L4-5 with a small soft disc
protrusion into the inferior aspect of the right neural foramen
without neural impingement. Slight spinal stenosis just below the
L4-5 disc space created by hypertrophied ligamentum flavum and facet
joints.
2. Postsurgical changes at L5-S1 on the right.

## 2020-06-20 ENCOUNTER — Other Ambulatory Visit: Payer: Self-pay

## 2020-06-20 MED ORDER — BENAZEPRIL HCL 40 MG PO TABS
ORAL_TABLET | ORAL | 0 refills | Status: DC
Start: 2020-06-20 — End: 2020-09-23

## 2020-06-20 NOTE — Telephone Encounter (Signed)
Patient came in person to check on the status of the authorization.   Needs a refill of benazepril (LOTENSIN) 40 MG tablet  Patient is completely out.   Preferred pharmacy:  [facility] [id number] - [place], [place] - [phone] AT [facility] [address] & [facility] Phone:  [phone]  Fax:  [phone]

## 2020-07-03 ENCOUNTER — Encounter: Payer: Self-pay | Admitting: Internal Medicine

## 2020-07-03 NOTE — Assessment & Plan Note (Signed)
Lab Results  Component Value Date   LDLCALC 69 12/13/2019   stable overall by history and exam, recent data reviewed with pt, and pt to continue medical treatment as before,  to f/u any worsening symptoms or concerns

## 2020-07-03 NOTE — Assessment & Plan Note (Signed)
BP Readings from Last 3 Encounters:  06/17/20 118/78  12/13/19 122/78  06/14/19 140/88  stable overall by history and exam, recent data reviewed with pt, and pt to continue medical treatment as before,  to f/u any worsening symptoms or concerns

## 2020-07-03 NOTE — Assessment & Plan Note (Signed)
Last vitamin D Lab Results  Component Value Date   VD25OH 28.48 (L) 12/13/2019  for vit d 2000 u qd

## 2020-07-03 NOTE — Assessment & Plan Note (Addendum)
Lab Results  Component Value Date   HGBA1C 8.4 Repeated and verified X2. 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

## 2020-07-03 NOTE — Progress Notes (Signed)
Established Patient Office Visit  Subjective:  Patient ID: Jessica Daniels, female    DOB: [birth year]  Age: 59 y.o. MRN: [id number]      Chief Complaint: (concise statement describing the symptom, problem, condition, diagnosis, physician recommended return, or other factor as reason for encounter) follow up HTN, HLD and hyperglycemia , anemia, vit d deficiency, low mcv       HPI:  Jessica Daniels is a 59 y.o. female here to f/u; overall doing ok,  Pt denies chest pain, increasing sob or doe, wheezing, orthopnea, PND, increased LE swelling, palpitations, dizziness or syncope.  Pt denies new neurological symptoms such as new headache, or facial or extremity weakness or numbness.  Pt denies polydipsia, polyuria, or symptomatic low sugars. Pt states overall good compliance with meds, mostly trying to follow appropriate diet, with wt overall stable,  but little exercise however. No overt bleeding .        Wt Readings from Last 3 Encounters:  06/17/20 228 lb (103.4 kg)  12/13/19 236 lb (107 kg)  06/14/19 234 lb (106.1 kg)   BP Readings from Last 3 Encounters:  06/17/20 118/78  12/13/19 122/78  06/14/19 140/88        Past Medical History:  Diagnosis Date  . Allergic rhinitis   . Anemia   . Anxiety   . Depression   . Diabetes mellitus   . Hyperlipidemia   . Hypertension    Past Surgical History:  Procedure Laterality Date  . CHOLECYSTECTOMY    . LUMBAR LAMINECTOMY      reports that she has never smoked. She has never used smokeless tobacco. She reports that she does not drink alcohol and does not use drugs. family history includes Diabetes in her unknown relative; Heart attack in her mother; Lung cancer in her father. Allergies  Allergen Reactions  . Crestor [Rosuvastatin]     Elevated liver enzymes   Current Outpatient Medications on File Prior to Visit  Medication Sig Dispense Refill  . amLODipine (NORVASC) 10 MG tablet TAKE 1 TABLET(10 MG) BY MOUTH DAILY 90 tablet 3  .  aspirin EC 81 MG tablet Take 1 tablet (81 mg total) by mouth daily. 90 tablet 11  . atorvastatin (LIPITOR) 20 MG tablet TAKE 1 TABLET(20 MG) BY MOUTH DAILY 90 tablet 3  . diclofenac (VOLTAREN) 75 MG EC tablet Take 75 mg by mouth 2 (two) times daily as needed.    . fluticasone (FLONASE) 50 MCG/ACT nasal spray Place 2 sprays into both nostrils daily. 16 g 6  . glipiZIDE (GLUCOTROL XL) 10 MG 24 hr tablet Take 1 tablet (10 mg total) by mouth daily with breakfast. 90 tablet 3  . glipiZIDE (GLUCOTROL XL) 5 MG 24 hr tablet TAKE 1 TABLET(5 MG) BY MOUTH DAILY WITH BREAKFAST 90 tablet 2  . glucose blood (ONETOUCH VERIO) test strip Use to check blood sugars twice a day 100 each 11  . hydrochlorothiazide (HYDRODIURIL) 25 MG tablet Take 1 tablet (25 mg total) by mouth daily. Annual appt due in June must see provider for future refills 90 tablet 0  . meloxicam (MOBIC) 15 MG tablet Take 1 tablet (15 mg total) by mouth daily. 30 tablet 0  . metFORMIN (GLUCOPHAGE) 1000 MG tablet Take 1 tablet (1,000 mg total) by mouth 2 (two) times daily with a meal. 180 tablet 3  . methocarbamol (ROBAXIN) 500 MG tablet Take 1 tablet (500 mg total) by mouth every 8 (eight) hours as needed. 30 tablet 0  .  Multiple Vitamin (MULTI-VITAMIN) tablet Take 1 tablet by mouth daily.    Letta Pate DELICA LANCETS 33G MISC Use to help check blood sugars twice a day 100 each 5  . sitaGLIPtin (JANUVIA) 100 MG tablet Take 1 tablet (100 mg total) by mouth daily. 90 tablet 3  . TRULICITY 1.5 MG/0.5ML SOPN ADMINISTER 0.5 ML UNDER THE SKIN 1 TIME A WEEK 6 mL 3  . RESTASIS 0.05 % ophthalmic emulsion 1 drop 2 (two) times daily.     No current facility-administered medications on file prior to visit.        ROS:  All others reviewed and negative.  Objective        PE:  BP 118/78 (BP Location: Left Arm, Patient Position: Sitting, Cuff Size: Large)   Pulse 90   Temp 97.8 F (36.6 C) (Oral)   Ht 5\' 7"  (1.702 m)   Wt 228 lb (103.4 kg)   LMP  08/06/2015   SpO2 96%   BMI 35.71 kg/m                 Constitutional: Pt appears in NAD               HENT: Head: NCAT.                Right Ear: External ear normal.                 Left Ear: External ear normal.                Eyes: . Pupils are equal, round, and reactive to light. Conjunctivae and EOM are normal               Nose: without d/c or deformity               Neck: Neck supple. Gross normal ROM               Cardiovascular: Normal rate and regular rhythm.                 Pulmonary/Chest: Effort normal and breath sounds without rales or wheezing.                Abd:  Soft, NT, ND, + BS, no organomegaly               Neurological: Pt is alert. At baseline orientation, motor grossly intact               Skin: Skin is warm. No rashes, no other new lesions, LE edema - none               Psychiatric: Pt behavior is normal without agitation   Assessment/Plan:  Jessica Daniels is a 59 y.o. Black or African American [2] female with  has a past medical history of Allergic rhinitis, Anemia, Anxiety, Depression, Diabetes mellitus, Hyperlipidemia, and Hypertension.   Assessment Plan  See problem oriented assessment and plan Labs reviewed for each problem: Lab Results  Component Value Date   WBC 6.4 12/13/2019   HGB 11.9 (L) 12/13/2019   HCT 38.2 12/13/2019   PLT 347.0 12/13/2019   GLUCOSE 265 (H) 12/13/2019   CHOL 143 12/13/2019   TRIG 175.0 (H) 12/13/2019   HDL 38.80 (L) 12/13/2019   LDLDIRECT 137.4 10/20/2009   LDLCALC 69 12/13/2019   ALT 25 12/13/2019   AST 21 12/13/2019   NA 135 12/13/2019   K 3.7 12/13/2019   CL 98 12/13/2019  CREATININE 1.31 (H) 12/13/2019   BUN 15 12/13/2019   CO2 30 12/13/2019   TSH 2.43 12/13/2019   HGBA1C 8.4 Repeated and verified X2. (H) 12/13/2019   MICROALBUR 1.0 12/13/2019    Micro: none  Cardiac tracings I have personally interpreted today:  none  Pertinent Radiological findings (summarize): none   I spent total 34 minutes  in caring for the patient for this visit:  1) by communicating with the patient during the visit  2) by review of pertinent vital sign data, physical examination and labs as documented in the assessment and plan  3) by review of pertinent imaging - none today  4) by review of pertinent procedures - none today  5) by obtaining and reviewing separately obtained information from family/caretaker and Care Everywhere - none today  6) by ordering medications  7) by ordering tests  8) by documenting all of this clinical information in the EHR including the management of each problem noted today in assessment and plan   Health Maintenance Due  Topic Date Due  . HEMOGLOBIN A1C  06/13/2020    There are no preventive care reminders to display for this patient.   Problem List Items Addressed This Visit      Medium   Vitamin D deficiency    Last vitamin D Lab Results  Component Value Date   VD25OH 28.48 (L) 12/13/2019  for vit d 2000 u qd      Iron deficiency anemia    Asympt,  Lab Results  Component Value Date   WBC 6.4 12/13/2019   HGB 11.9 (L) 12/13/2019   HCT 38.2 12/13/2019   MCV 70.7 (L) 12/13/2019   PLT 347.0 12/13/2019  stable overall by history and exam, recent data reviewed with pt, and pt to continue medical treatment as before,  to f/u any worsening symptoms or concerns      Hyperlipidemia    Lab Results  Component Value Date   LDLCALC 69 12/13/2019   stable overall by history and exam, recent data reviewed with pt, and pt to continue medical treatment as before,  to f/u any worsening symptoms or concerns       Essential hypertension    BP Readings from Last 3 Encounters:  06/17/20 118/78  12/13/19 122/78  06/14/19 140/88  stable overall by history and exam, recent data reviewed with pt, and pt to continue medical treatment as before,  to f/u any worsening symptoms or concerns       Diabetes (HCC) - Primary    Lab Results  Component Value Date    HGBA1C 8.4 Repeated and verified X2. 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   Follow-up: Return in about 6 months (around 12/16/2020).   Oliver Barre, MD 07/03/2020 8:49 PM [facility]

## 2020-07-03 NOTE — Assessment & Plan Note (Signed)
Asympt,  Lab Results  Component Value Date   WBC 6.4 12/13/2019   HGB 11.9 (L) 12/13/2019   HCT 38.2 12/13/2019   MCV 70.7 (L) 12/13/2019   PLT 347.0 12/13/2019  stable overall by history and exam, recent data reviewed with pt, and pt to continue medical treatment as before,  to f/u any worsening symptoms or concerns

## 2020-08-04 ENCOUNTER — Encounter: Payer: Self-pay | Admitting: Internal Medicine

## 2020-08-04 ENCOUNTER — Other Ambulatory Visit: Payer: Self-pay

## 2020-08-04 ENCOUNTER — Ambulatory Visit (INDEPENDENT_AMBULATORY_CARE_PROVIDER_SITE_OTHER): Payer: BC Managed Care – PPO | Admitting: Internal Medicine

## 2020-08-04 VITALS — BP 158/96 | HR 73 | Temp 98.2°F | Ht 67.0 in | Wt 225.0 lb

## 2020-08-04 DIAGNOSIS — D509 Iron deficiency anemia, unspecified: Secondary | ICD-10-CM

## 2020-08-04 DIAGNOSIS — R718 Other abnormality of red blood cells: Secondary | ICD-10-CM | POA: Diagnosis not present

## 2020-08-04 DIAGNOSIS — R002 Palpitations: Secondary | ICD-10-CM | POA: Diagnosis not present

## 2020-08-04 DIAGNOSIS — Z0000 Encounter for general adult medical examination without abnormal findings: Secondary | ICD-10-CM | POA: Diagnosis not present

## 2020-08-04 DIAGNOSIS — I10 Essential (primary) hypertension: Secondary | ICD-10-CM | POA: Diagnosis not present

## 2020-08-04 DIAGNOSIS — E559 Vitamin D deficiency, unspecified: Secondary | ICD-10-CM | POA: Diagnosis not present

## 2020-08-04 DIAGNOSIS — E1165 Type 2 diabetes mellitus with hyperglycemia: Secondary | ICD-10-CM | POA: Diagnosis not present

## 2020-08-04 DIAGNOSIS — Z0001 Encounter for general adult medical examination with abnormal findings: Secondary | ICD-10-CM

## 2020-08-04 LAB — CBC WITH DIFFERENTIAL/PLATELET
Basophils Absolute: 0 10*3/uL (ref 0.0–0.1)
Basophils Relative: 0.5 % (ref 0.0–3.0)
Eosinophils Absolute: 0 10*3/uL (ref 0.0–0.7)
Eosinophils Relative: 0.5 % (ref 0.0–5.0)
HCT: 39.9 % (ref 36.0–46.0)
Hemoglobin: 12.5 g/dL (ref 12.0–15.0)
Lymphocytes Relative: 26.8 % (ref 12.0–46.0)
Lymphs Abs: 2 10*3/uL (ref 0.7–4.0)
MCHC: 31.2 g/dL (ref 30.0–36.0)
MCV: 69 fl — ABNORMAL LOW (ref 78.0–100.0)
Monocytes Absolute: 0.5 10*3/uL (ref 0.1–1.0)
Monocytes Relative: 6.3 % (ref 3.0–12.0)
Neutro Abs: 4.9 10*3/uL (ref 1.4–7.7)
Neutrophils Relative %: 65.9 % (ref 43.0–77.0)
Platelets: 371 10*3/uL (ref 150.0–400.0)
RBC: 5.79 Mil/uL — ABNORMAL HIGH (ref 3.87–5.11)
RDW: 16.1 % — ABNORMAL HIGH (ref 11.5–15.5)
WBC: 7.5 10*3/uL (ref 4.0–10.5)

## 2020-08-04 LAB — URINALYSIS, ROUTINE W REFLEX MICROSCOPIC
Bilirubin Urine: NEGATIVE
Hgb urine dipstick: NEGATIVE
Ketones, ur: NEGATIVE
Leukocytes,Ua: NEGATIVE
Nitrite: NEGATIVE
RBC / HPF: NONE SEEN (ref 0–?)
Specific Gravity, Urine: 1.015 (ref 1.000–1.030)
Total Protein, Urine: NEGATIVE
Urine Glucose: NEGATIVE
Urobilinogen, UA: 0.2 (ref 0.0–1.0)
pH: 6.5 (ref 5.0–8.0)

## 2020-08-04 LAB — TSH: TSH: 3.52 u[IU]/mL (ref 0.35–4.50)

## 2020-08-04 LAB — HEPATIC FUNCTION PANEL
ALT: 21 U/L (ref 0–35)
AST: 17 U/L (ref 0–37)
Albumin: 4.7 g/dL (ref 3.5–5.2)
Alkaline Phosphatase: 114 U/L (ref 39–117)
Bilirubin, Direct: 0.1 mg/dL (ref 0.0–0.3)
Total Bilirubin: 0.5 mg/dL (ref 0.2–1.2)
Total Protein: 8.1 g/dL (ref 6.0–8.3)

## 2020-08-04 LAB — HEMOGLOBIN A1C: Hgb A1c MFr Bld: 8.2 % — ABNORMAL HIGH (ref 4.6–6.5)

## 2020-08-04 LAB — LIPID PANEL
Cholesterol: 146 mg/dL (ref 0–200)
HDL: 56.1 mg/dL (ref >39.00)
LDL Cholesterol: 73 mg/dL (ref 0–99)
NonHDL: 89.76
Total CHOL/HDL Ratio: 3
Triglycerides: 84 mg/dL (ref 0.0–149.0)
VLDL: 16.8 mg/dL (ref 0.0–40.0)

## 2020-08-04 LAB — MICROALBUMIN / CREATININE URINE RATIO
Creatinine,U: 56 mg/dL
Microalb Creat Ratio: 10.4 mg/g (ref 0.0–30.0)
Microalb, Ur: 5.8 mg/dL — ABNORMAL HIGH (ref 0.0–1.9)

## 2020-08-04 LAB — VITAMIN D 25 HYDROXY (VIT D DEFICIENCY, FRACTURES): VITD: 29.69 ng/mL — ABNORMAL LOW (ref 30.00–100.00)

## 2020-08-04 NOTE — Progress Notes (Signed)
Established Patient Office Visit  Subjective:  Patient ID: Jessica Daniels, female    DOB: June 14, 1961  Age: 60 y.o. MRN: [id number]        Chief Complaint:: wellness exam and Palpitations (Started about two weeks ago)  and HTN, DM, anxiety       HPI:  Jessica Daniels is a 59 y.o. female here for wellness exam, plans to make her own eye exam appt; declines colonoscopy, flu, pneumovax, tdap    Wt Readings from Last 3 Encounters:  08/04/20 225 lb (102.1 kg)  06/17/20 228 lb (103.4 kg)  12/13/19 236 lb (107 kg)   BP Readings from Last 3 Encounters:  08/04/20 (!) 158/96  06/17/20 118/78  12/13/19 122/78   Immunization History  Administered Date(s) Administered  . Moderna Sars-Covid-2 Vaccination 09/02/2019, 10/01/2019, 05/05/2020  There are no preventive care reminders to display for this patient.       Also Pt denies chest pain, increased sob or doe, wheezing, orthopnea, PND, increased LE swelling, palpitations, dizziness or syncope except has 2 wks intermittent palpitations that seem to gradually start and resolve, she believes likely stress related, does not want cardiac event monitor or echo.  Pt denies new neurological symptoms such as new headache, or facial or extremity weakness or numbness   Pt denies polydipsia, polyuria, BP at home has been controlled until very recent, but has only checked a few times.  Denies worsening depressive symptoms, suicidal ideation, or panic; has ongoing anxiety and stress over husband behavior recent, declines need for counseling referral or other med tx change  Past Medical History:  Diagnosis Date  . Allergic rhinitis   . Anemia   . Anxiety   . Depression   . Diabetes mellitus   . Hyperlipidemia   . Hypertension    Past Surgical History:  Procedure Laterality Date  . CHOLECYSTECTOMY    . LUMBAR LAMINECTOMY      reports that she has never smoked. She has never used smokeless tobacco. She reports that she does not drink alcohol and  does not use drugs. family history includes Diabetes in her unknown relative; Heart attack in her mother; Lung cancer in her father. Allergies  Allergen Reactions  . Crestor [Rosuvastatin]     Elevated liver enzymes   Current Outpatient Medications on File Prior to Visit  Medication Sig Dispense Refill  . amLODipine (NORVASC) 10 MG tablet TAKE 1 TABLET(10 MG) BY MOUTH DAILY 90 tablet 3  . aspirin EC 81 MG tablet Take 1 tablet (81 mg total) by mouth daily. 90 tablet 11  . atorvastatin (LIPITOR) 20 MG tablet TAKE 1 TABLET(20 MG) BY MOUTH DAILY 90 tablet 3  . benazepril (LOTENSIN) 40 MG tablet 1 Tablet by mouth daily. 90 tablet 0  . diclofenac (VOLTAREN) 75 MG EC tablet Take 75 mg by mouth 2 (two) times daily as needed.    . fluticasone (FLONASE) 50 MCG/ACT nasal spray Place 2 sprays into both nostrils daily. 16 g 6  . glipiZIDE (GLUCOTROL XL) 10 MG 24 hr tablet Take 1 tablet (10 mg total) by mouth daily with breakfast. 90 tablet 3  . glipiZIDE (GLUCOTROL XL) 5 MG 24 hr tablet TAKE 1 TABLET(5 MG) BY MOUTH DAILY WITH BREAKFAST 90 tablet 2  . glucose blood (ONETOUCH VERIO) test strip Use to check blood sugars twice a day 100 each 11  . hydrochlorothiazide (HYDRODIURIL) 25 MG tablet Take 1 tablet (25 mg total) by mouth daily. Annual appt due in June must  see provider for future refills 90 tablet 0  . meloxicam (MOBIC) 15 MG tablet Take 1 tablet (15 mg total) by mouth daily. 30 tablet 0  . metFORMIN (GLUCOPHAGE) 1000 MG tablet Take 1 tablet (1,000 mg total) by mouth 2 (two) times daily with a meal. 180 tablet 3  . methocarbamol (ROBAXIN) 500 MG tablet Take 1 tablet (500 mg total) by mouth every 8 (eight) hours as needed. 30 tablet 0  . Multiple Vitamin (MULTI-VITAMIN) tablet Take 1 tablet by mouth daily.    Letta Pate DELICA LANCETS 33G MISC Use to help check blood sugars twice a day 100 each 5  . RESTASIS 0.05 % ophthalmic emulsion 1 drop 2 (two) times daily.    . sitaGLIPtin (JANUVIA) 100 MG  tablet Take 1 tablet (100 mg total) by mouth daily. 90 tablet 3  . TRULICITY 1.5 MG/0.5ML SOPN ADMINISTER 0.5 ML UNDER THE SKIN 1 TIME A WEEK 6 mL 3   No current facility-administered medications on file prior to visit.        ROS:  All others reviewed and negative.  Objective        PE:  BP (!) 158/96   Pulse 73   Temp 98.2 F (36.8 C) (Oral)   Ht 5\' 7"  (1.702 m)   Wt 225 lb (102.1 kg)   LMP 08/06/2015   SpO2 98%   BMI 35.24 kg/m                 Constitutional: Pt appears in NAD               HENT: Head: NCAT.                Right Ear: External ear normal.                 Left Ear: External ear normal.                Eyes: . Pupils are equal, round, and reactive to light. Conjunctivae and EOM are normal               Nose: without d/c or deformity               Neck: Neck supple. Gross normal ROM               Cardiovascular: Normal rate and regular rhythm.                 Pulmonary/Chest: Effort normal and breath sounds without rales or wheezing.                Abd:  Soft, NT, ND, + BS, no organomegaly               Neurological: Pt is alert. At baseline orientation, motor grossly intact               Skin: Skin is warm. No rashes, no other new lesions, LE edema - none               Psychiatric: Pt behavior is normal without agitation   Micro: none  Cardiac tracings I have personally interpreted today:  ECG - NSR without ischemia changes or other arrythmia  Pertinent Radiological findings (summarize): none   Lab Results  Component Value Date   WBC 7.5 08/04/2020   HGB 12.5 08/04/2020   HCT 39.9 08/04/2020   PLT 371.0 08/04/2020   GLUCOSE 265 (H) 12/13/2019   CHOL 146 08/04/2020  TRIG 84.0 08/04/2020   HDL 56.10 08/04/2020   LDLDIRECT 137.4 10/20/2009   LDLCALC 73 08/04/2020   ALT 21 08/04/2020   AST 17 08/04/2020   NA 135 12/13/2019   K 3.7 12/13/2019   CL 98 12/13/2019   CREATININE 1.31 (H) 12/13/2019   BUN 15 12/13/2019   CO2 30 12/13/2019   TSH 3.52  08/04/2020   HGBA1C 8.2 (H) 08/04/2020   MICROALBUR 5.8 (H) 08/04/2020   Assessment/Plan:  Jessica Daniels is a 60 y.o. Black or African American [2] female with  has a past medical history of Allergic rhinitis, Anemia, Anxiety, Depression, Diabetes mellitus, Hyperlipidemia, and Hypertension. Encounter for well adult exam with abnormal findings Age and sex appropriate education and counseling updated with regular exercise and diet Referrals for preventative services - declines colonoscopy Immunizations addressed - declines flu, pneumovax, tdap Smoking counseling  - none needed Evidence for depression or other mood disorder - reassurance, declines referral for counseling Most recent labs reviewed. I have personally reviewed and have noted: 1) the patient's medical and social history 2) The patient's current medications and supplements 3) The patient's height, weight, and BMI have been recorded in the chart   Diabetes . Lab Results  Component Value Date   HGBA1C 8.2 (H) 08/04/2020   Stable, pt to continue current medical treatment glipizide, metformin, januvia, trulicity  Current Outpatient Medications (Endocrine & Metabolic):  .  glipiZIDE (GLUCOTROL XL) 10 MG 24 hr tablet, Take 1 tablet (10 mg total) by mouth daily with breakfast. .  glipiZIDE (GLUCOTROL XL) 5 MG 24 hr tablet, TAKE 1 TABLET(5 MG) BY MOUTH DAILY WITH BREAKFAST .  metFORMIN (GLUCOPHAGE) 1000 MG tablet, Take 1 tablet (1,000 mg total) by mouth 2 (two) times daily with a meal. .  sitaGLIPtin (JANUVIA) 100 MG tablet, Take 1 tablet (100 mg total) by mouth daily. .  TRULICITY 1.5 MG/0.5ML SOPN, ADMINISTER 0.5 ML UNDER THE SKIN 1 TIME A WEEK  Current Outpatient Medications (Cardiovascular):  .  amLODipine (NORVASC) 10 MG tablet, TAKE 1 TABLET(10 MG) BY MOUTH DAILY .  atorvastatin (LIPITOR) 20 MG tablet, TAKE 1 TABLET(20 MG) BY MOUTH DAILY .  benazepril (LOTENSIN) 40 MG tablet, 1 Tablet by mouth daily. .   hydrochlorothiazide (HYDRODIURIL) 25 MG tablet, Take 1 tablet (25 mg total) by mouth daily. Annual appt due in June must see provider for future refills  Current Outpatient Medications (Respiratory):  .  fluticasone (FLONASE) 50 MCG/ACT nasal spray, Place 2 sprays into both nostrils daily.  Current Outpatient Medications (Analgesics):  .  aspirin EC 81 MG tablet, Take 1 tablet (81 mg total) by mouth daily. .  diclofenac (VOLTAREN) 75 MG EC tablet, Take 75 mg by mouth 2 (two) times daily as needed. .  meloxicam (MOBIC) 15 MG tablet, Take 1 tablet (15 mg total) by mouth daily.   Current Outpatient Medications (Other):  .  glucose blood (ONETOUCH VERIO) test strip, Use to check blood sugars twice a day .  methocarbamol (ROBAXIN) 500 MG tablet, Take 1 tablet (500 mg total) by mouth every 8 (eight) hours as needed. .  Multiple Vitamin (MULTI-VITAMIN) tablet, Take 1 tablet by mouth daily. Letta Pate DELICA LANCETS 33G MISC, Use to help check blood sugars twice a day .  RESTASIS 0.05 % ophthalmic emulsion, 1 drop 2 (two) times daily.  Iron deficiency anemia Also for iron lab with f/u, no recent overt bleeding  Low mean corpuscular volume (MCV) Also for hgb electrophoresis, r/o thallasmia  Vitamin  D deficiency Last vitamin D Lab Results  Component Value Date   VD25OH 29.69 (L) 08/04/2020   Low, to start oral replacement D3 2000 u qd  Essential hypertension BP Readings from Last 3 Encounters:  08/04/20 (!) 158/96  06/17/20 118/78  12/13/19 122/78   Stable, pt to continue medical treatment with norvasc, lotension as pt adamant bp at home controlled, to continue to monitor at home   Palpitations Etiology unclear, ecg reviewed, possibly anxiety stress related, pt declines further eval such as echo or event monitor for now, to check TSH with labs  Followup: Return in about 4 months (around 12/16/2020).  Oliver Barre, MD 08/10/2020 10:05 PM [facility]  Primary Care  - [facility]

## 2020-08-04 NOTE — Patient Instructions (Addendum)
Please continue all other medications as before, and refills have been done if requested.  Please have the pharmacy call with any other refills you may need.  Please continue your efforts at being more active, low cholesterol diet, and weight control.  You are otherwise up to date with prevention measures today.  Please keep your appointments with your specialists as you may have planned - endo on feb 22  Please go to the LAB at the blood drawing area for the tests to be done  You will be contacted by phone if any changes need to be made immediately.  Otherwise, you will receive a letter about your results with an explanation, but please check with MyChart first.  Please remember to sign up for MyChart if you have not done so, as this will be important to you in the future with finding out test results, communicating by private email, and scheduling acute appointments online when needed.  Please make an Appointment to return in 6 months, or sooner if needed, also with Lab Appointment for testing done 3-5 days before at the FIRST FLOOR Lab (so this is for TWO appointments - please see the scheduling desk as you leave)  Due to the ongoing Covid 19 pandemic, our lab now requires an appointment for any labs done at our office.  If you need labs done and do not have an appointment, please call our office ahead of time to schedule before presenting to the lab for your testing.  Otherwise well see you at your next appt as scheduled

## 2020-08-05 ENCOUNTER — Encounter: Payer: Self-pay | Admitting: Internal Medicine

## 2020-08-06 ENCOUNTER — Encounter: Payer: Self-pay | Admitting: Internal Medicine

## 2020-08-06 LAB — HGB FRACTIONATION CASCADE
Hgb A2: 2.1 % (ref 1.8–3.2)
Hgb A: 97.9 % (ref 96.4–98.8)
Hgb F: 0 % (ref 0.0–2.0)
Hgb S: 0 %

## 2020-08-10 ENCOUNTER — Encounter: Payer: Self-pay | Admitting: Internal Medicine

## 2020-08-10 NOTE — Assessment & Plan Note (Signed)
Also for hgb electrophoresis, r/o thallasmia

## 2020-08-10 NOTE — Assessment & Plan Note (Addendum)
Etiology unclear, ecg reviewed, possibly anxiety stress related, pt declines further eval such as echo or event monitor for now, to check TSH with labs

## 2020-08-10 NOTE — Assessment & Plan Note (Signed)
Age and sex appropriate education and counseling updated with regular exercise and diet Referrals for preventative services - declines colonoscopy Immunizations addressed - declines flu, pneumovax, tdap Smoking counseling  - none needed Evidence for depression or other mood disorder - reassurance, declines referral for counseling Most recent labs reviewed. I have personally reviewed and have noted: 1) the patient's medical and social history 2) The patient's current medications and supplements 3) The patient's height, weight, and BMI have been recorded in the chart

## 2020-08-10 NOTE — Assessment & Plan Note (Addendum)
BP Readings from Last 3 Encounters:  08/04/20 (!) 158/96  06/17/20 118/78  12/13/19 122/78   Stable, pt to continue medical treatment with norvasc, lotension as pt adamant bp at home controlled, to continue to monitor at home

## 2020-08-10 NOTE — Assessment & Plan Note (Signed)
Also for iron lab with f/u, no recent overt bleeding

## 2020-08-10 NOTE — Assessment & Plan Note (Signed)
.   Lab Results  Component Value Date   HGBA1C 8.2 (H) 08/04/2020   Stable, pt to continue current medical treatment glipizide, metformin, januvia, trulicity  Current Outpatient Medications (Endocrine & Metabolic):  .  glipiZIDE (GLUCOTROL XL) 10 MG 24 hr tablet, Take 1 tablet (10 mg total) by mouth daily with breakfast. .  glipiZIDE (GLUCOTROL XL) 5 MG 24 hr tablet, TAKE 1 TABLET(5 MG) BY MOUTH DAILY WITH BREAKFAST .  metFORMIN (GLUCOPHAGE) 1000 MG tablet, Take 1 tablet (1,000 mg total) by mouth 2 (two) times daily with a meal. .  sitaGLIPtin (JANUVIA) 100 MG tablet, Take 1 tablet (100 mg total) by mouth daily. .  TRULICITY 1.5 MG/0.5ML SOPN, ADMINISTER 0.5 ML UNDER THE SKIN 1 TIME A WEEK  Current Outpatient Medications (Cardiovascular):  .  amLODipine (NORVASC) 10 MG tablet, TAKE 1 TABLET(10 MG) BY MOUTH DAILY .  atorvastatin (LIPITOR) 20 MG tablet, TAKE 1 TABLET(20 MG) BY MOUTH DAILY .  benazepril (LOTENSIN) 40 MG tablet, 1 Tablet by mouth daily. .  hydrochlorothiazide (HYDRODIURIL) 25 MG tablet, Take 1 tablet (25 mg total) by mouth daily. Annual appt due in June must see provider for future refills  Current Outpatient Medications (Respiratory):  .  fluticasone (FLONASE) 50 MCG/ACT nasal spray, Place 2 sprays into both nostrils daily.  Current Outpatient Medications (Analgesics):  .  aspirin EC 81 MG tablet, Take 1 tablet (81 mg total) by mouth daily. .  diclofenac (VOLTAREN) 75 MG EC tablet, Take 75 mg by mouth 2 (two) times daily as needed. .  meloxicam (MOBIC) 15 MG tablet, Take 1 tablet (15 mg total) by mouth daily.   Current Outpatient Medications (Other):  .  glucose blood (ONETOUCH VERIO) test strip, Use to check blood sugars twice a day .  methocarbamol (ROBAXIN) 500 MG tablet, Take 1 tablet (500 mg total) by mouth every 8 (eight) hours as needed. .  Multiple Vitamin (MULTI-VITAMIN) tablet, Take 1 tablet by mouth daily. Letta Pate DELICA LANCETS 33G MISC, Use to help check  blood sugars twice a day .  RESTASIS 0.05 % ophthalmic emulsion, 1 drop 2 (two) times daily.

## 2020-08-10 NOTE — Assessment & Plan Note (Signed)
Last vitamin D Lab Results  Component Value Date   VD25OH 29.69 (L) 08/04/2020   Low, to start oral replacement D3 2000 u qd

## 2020-08-21 ENCOUNTER — Telehealth: Payer: Self-pay | Admitting: Internal Medicine

## 2020-08-21 MED ORDER — ONETOUCH VERIO VI STRP: ORAL_STRIP | 11 refills | Status: DC

## 2020-08-21 MED ORDER — AMLODIPINE BESYLATE 10 MG PO TABS: ORAL_TABLET | ORAL | 3 refills | Status: DC

## 2020-08-21 NOTE — Telephone Encounter (Signed)
Patient requesting refill for amLODipine (NORVASC) 10 MG tablet and glucose blood (ONETOUCH VERIO) test strip   Pharmacy [facility] [phone] - , [place] - [address] AT [facility] OF [address] & [facility]

## 2020-08-22 ENCOUNTER — Other Ambulatory Visit: Payer: Self-pay

## 2020-08-26 ENCOUNTER — Encounter: Payer: Self-pay | Admitting: Endocrinology

## 2020-08-26 ENCOUNTER — Other Ambulatory Visit: Payer: Self-pay

## 2020-08-26 ENCOUNTER — Ambulatory Visit: Payer: BC Managed Care – PPO | Admitting: Endocrinology

## 2020-08-26 VITALS — BP 120/80 | HR 91 | Ht 67.0 in | Wt 228.0 lb

## 2020-08-26 DIAGNOSIS — E1165 Type 2 diabetes mellitus with hyperglycemia: Secondary | ICD-10-CM

## 2020-08-26 LAB — POCT GLYCOSYLATED HEMOGLOBIN (HGB A1C): Hemoglobin A1C: 7.3 % — AB (ref 4.0–5.6)

## 2020-08-26 MED ORDER — TRULICITY 3 MG/0.5ML ~~LOC~~ SOAJ: 3.0000 mg | SUBCUTANEOUS | 3 refills | Status: DC

## 2020-08-26 MED ORDER — GLIPIZIDE ER 5 MG PO TB24: 5.0000 mg | ORAL_TABLET | ORAL | 2 refills | Status: DC

## 2020-08-26 NOTE — Progress Notes (Signed)
Subjective:    Patient ID: Jessica Daniels, female    DOB: [birth year], 60 y.o.   MRN: [id number]  HPI pt is referred by Dr Jonny Ruiz, for diabetes.  Pt states DM was dx'ed in 2000; it is complicated by CRI; she has never been on insulin; pt says her diet and exercise are poor; she has never had GDM, pancreatitis, pancreatic surgery, severe hypoglycemia or DKA.  She takes Trulicity and 3 oral meds. Main symptom is fatigue.  She says cbg varies from 98-200.  Past Medical History:  Diagnosis Date  . Allergic rhinitis   . Anemia   . Anxiety   . Depression   . Diabetes mellitus   . Hyperlipidemia   . Hypertension     Past Surgical History:  Procedure Laterality Date  . CHOLECYSTECTOMY    . LUMBAR LAMINECTOMY      Social History   Socioeconomic History  . Marital status: Married    Spouse name: Not on file  . Number of children: Not on file  . Years of education: Not on file  . Highest education level: Not on file  Occupational History  . Occupation: Corporate treasurer    Comment: [employer]  Tobacco Use  . Smoking status: Never Smoker  . Smokeless tobacco: Never Used  Substance and Sexual Activity  . Alcohol use: No  . Drug use: No  . Sexual activity: Not on file  Other Topics Concern  . Not on file  Social History Narrative  . Not on file   Social Determinants of Health   Financial Resource Strain: Not on file  Food Insecurity: Not on file  Transportation Needs: Not on file  Physical Activity: Not on file  Stress: Not on file  Social Connections: Not on file  Intimate Partner Violence: Not on file    Current Outpatient Medications on File Prior to Visit  Medication Sig Dispense Refill  . amLODipine (NORVASC) 10 MG tablet TAKE 1 TABLET(10 MG) BY MOUTH DAILY 90 tablet 3  . aspirin EC 81 MG tablet Take 1 tablet (81 mg total) by mouth daily. 90 tablet 11  . atorvastatin (LIPITOR) 20 MG tablet TAKE 1 TABLET(20 MG) BY MOUTH DAILY 90 tablet 3  . benazepril  (LOTENSIN) 40 MG tablet 1 Tablet by mouth daily. 90 tablet 0  . diclofenac (VOLTAREN) 75 MG EC tablet Take 75 mg by mouth 2 (two) times daily as needed.    . fluticasone (FLONASE) 50 MCG/ACT nasal spray Place 2 sprays into both nostrils daily. 16 g 6  . glucose blood (ONETOUCH VERIO) test strip Use to check blood sugars twice a day 100 each 11  . hydrochlorothiazide (HYDRODIURIL) 25 MG tablet Take 1 tablet (25 mg total) by mouth daily. Annual appt due in June must see provider for future refills 90 tablet 0  . meloxicam (MOBIC) 15 MG tablet Take 1 tablet (15 mg total) by mouth daily. 30 tablet 0  . metFORMIN (GLUCOPHAGE) 1000 MG tablet Take 1 tablet (1,000 mg total) by mouth 2 (two) times daily with a meal. 180 tablet 3  . methocarbamol (ROBAXIN) 500 MG tablet Take 1 tablet (500 mg total) by mouth every 8 (eight) hours as needed. 30 tablet 0  . Multiple Vitamin (MULTI-VITAMIN) tablet Take 1 tablet by mouth daily.    Letta Pate DELICA LANCETS 33G MISC Use to help check blood sugars twice a day 100 each 5  . RESTASIS 0.05 % ophthalmic emulsion 1 drop 2 (two) times daily.  No current facility-administered medications on file prior to visit.    Allergies  Allergen Reactions  . Crestor [Rosuvastatin]     Elevated liver enzymes    Family History  Problem Relation Age of Onset  . Diabetes Other        1st degree relative  . Heart attack Mother   . Lung cancer Father     BP 120/80 (BP Location: Right Arm, Patient Position: Sitting, Cuff Size: Large)   Pulse 91   Ht 5\' 7"  (1.702 m)   Wt 228 lb (103.4 kg)   LMP 08/06/2015   SpO2 98%   BMI 35.71 kg/m    Review of Systems denies chest pain, sob, n/v, urinary frequency, and depression. She has lost a few lbs--intentional.      Objective:   Physical Exam VITAL SIGNS:  See vs page.   GENERAL: no distress.   Pulses: dorsalis pedis intact bilat.   MSK: no deformity of the feet.   CV: no leg edema.   Skin:  no ulcer on the feet.   normal color and temp on the feet.   Neuro: sensation is intact to touch on the feet.     Lab Results  Component Value Date   HGBA1C 8.2 (H) 08/04/2020   Lab Results  Component Value Date   CREATININE 1.31 (H) 12/13/2019   BUN 15 12/13/2019   NA 135 12/13/2019   K 3.7 12/13/2019   CL 98 12/13/2019   CO2 30 12/13/2019   I have reviewed outside records, and summarized: Pt was noted to have elevated A1c, and referred here.  HTN and palpitations were also addressed.       Assessment & Plan:  Type 2 DM: uncontrolled.  CRI: we'll phase out metformin as glycemic control permits.    Patient Instructions  good diet and exercise significantly improve the control of your diabetes.  please let me know if you wish to be referred to a dietician.  high blood sugar is very risky to your health.  you should see an eye doctor and dentist every year.  It is very important to get all recommended vaccinations.  Controlling your blood pressure and cholesterol drastically reduces the damage diabetes does to your body.  Those who smoke should quit.  Please discuss these with your doctor.  check your blood sugar once a day.  vary the time of day when you check, between before the 3 meals, and at bedtime.  also check if you have symptoms of your blood sugar being too high or too low.  please keep a record of the readings and bring it to your next appointment here (or you can bring the meter itself).  You can write it on any piece of paper.  please call 02/12/2020 sooner if your blood sugar goes below 70, or if most of your readings are over 200.   We will need to take this complex situation in stages For now, please: Please continue the same metformin, and: Decrease the glipizide to 5 mg per day, and: I have sent a prescription to your pharmacy, to double the Trulicity.  Please come back for a follow-up appointment in 3 months.

## 2020-08-26 NOTE — Patient Instructions (Addendum)
good diet and exercise significantly improve the control of your diabetes.  please let me know if you wish to be referred to a dietician.  high blood sugar is very risky to your health.  you should see an eye doctor and dentist every year.  It is very important to get all recommended vaccinations.  Controlling your blood pressure and cholesterol drastically reduces the damage diabetes does to your body.  Those who smoke should quit.  Please discuss these with your doctor.  check your blood sugar once a day.  vary the time of day when you check, between before the 3 meals, and at bedtime.  also check if you have symptoms of your blood sugar being too high or too low.  please keep a record of the readings and bring it to your next appointment here (or you can bring the meter itself).  You can write it on any piece of paper.  please call us sooner if your blood sugar goes below 70, or if most of your readings are over 200.   We will need to take this complex situation in stages For now, please: Please continue the same metformin, and: Decrease the glipizide to 5 mg per day, and: I have sent a prescription to your pharmacy, to double the Trulicity.  Please come back for a follow-up appointment in 3 months.

## 2020-09-23 ENCOUNTER — Other Ambulatory Visit: Payer: Self-pay

## 2020-09-23 ENCOUNTER — Telehealth: Payer: Self-pay | Admitting: Internal Medicine

## 2020-09-23 MED ORDER — BENAZEPRIL HCL 40 MG PO TABS: ORAL_TABLET | ORAL | 0 refills | Status: DC

## 2020-09-23 NOTE — Telephone Encounter (Signed)
benazepril (LOTENSIN) 40 MG tablet [facility] [phone] - [place], [place] - [address] AT [facility] OF [address] & [facility] Phone:  [phone]  Fax:  [phone]     Requesting a refill Last seen-02.22.22 Next apt-06.14.22

## 2020-10-28 ENCOUNTER — Telehealth: Payer: Self-pay | Admitting: Internal Medicine

## 2020-10-28 DIAGNOSIS — E1165 Type 2 diabetes mellitus with hyperglycemia: Secondary | ICD-10-CM

## 2020-10-28 DIAGNOSIS — E559 Vitamin D deficiency, unspecified: Secondary | ICD-10-CM

## 2020-10-28 NOTE — Telephone Encounter (Signed)
Ok this is done 

## 2020-10-28 NOTE — Telephone Encounter (Signed)
Patient called and was wondering if lab orders could be placed before her appt on 12-16-20. Please advise

## 2020-11-13 ENCOUNTER — Telehealth: Payer: Self-pay | Admitting: Internal Medicine

## 2020-11-13 MED ORDER — HYDROCHLOROTHIAZIDE 25 MG PO TABS: 25.0000 mg | ORAL_TABLET | Freq: Every day | ORAL | 0 refills | Status: DC

## 2020-11-13 NOTE — Telephone Encounter (Signed)
hydrochlorothiazide (HYDRODIURIL) 25 MG tablet [facility] [phone] - [place], [place] - [address] AT [facility] OF [address] & [facility] Phone:  [phone]  Fax:  [phone]     Last seen- 01.31.22 Next- 06.14.22

## 2020-11-26 ENCOUNTER — Ambulatory Visit: Payer: BC Managed Care – PPO | Admitting: Endocrinology

## 2020-11-26 ENCOUNTER — Other Ambulatory Visit: Payer: Self-pay

## 2020-11-26 VITALS — BP 124/80 | HR 80 | Ht 67.0 in | Wt 227.2 lb

## 2020-11-26 DIAGNOSIS — E1165 Type 2 diabetes mellitus with hyperglycemia: Secondary | ICD-10-CM

## 2020-11-26 LAB — POCT GLYCOSYLATED HEMOGLOBIN (HGB A1C): Hemoglobin A1C: 7.7 % — AB (ref 4.0–5.6)

## 2020-11-26 MED ORDER — TRULICITY 4.5 MG/0.5ML ~~LOC~~ SOAJ: 4.5000 mg | SUBCUTANEOUS | 3 refills | Status: DC

## 2020-11-26 MED ORDER — METFORMIN HCL 1000 MG PO TABS: 1000.0000 mg | ORAL_TABLET | Freq: Every day | ORAL | 3 refills | Status: DC

## 2020-11-26 NOTE — Patient Instructions (Addendum)
check your blood sugar once a day.  vary the time of day when you check, between before the 3 meals, and at bedtime.  also check if you have symptoms of your blood sugar being too high or too low.  please keep a record of the readings and bring it to your next appointment here (or you can bring the meter itself).  You can write it on any piece of paper.  please call us sooner if your blood sugar goes below 70, or if most of your readings are over 200.   We will need to take this complex situation in stages Please decrease the metformin to the morning only I have sent a prescription to your pharmacy, to increase the Trulicity again.  Please come back for a follow-up appointment in 3 months.

## 2020-11-26 NOTE — Progress Notes (Signed)
Subjective:    Patient ID: Jessica Daniels, female    DOB: January 14, 1961, 60 y.o.   MRN: [id number]  HPI  Pt returns for f/u of diabetes mellitus: DM type: 2 Dx'ed: 2000 Complications: stage 3 CRI Therapy: Trulicity and metformin GDM: never DKA: never Severe hypoglycemia: never Pancreatitis: never Pancreatic imaging: normal on 2006 [place] SDOH: none Other: she has never been on insulin Interval history: pt states she feels well in general.  no cbg record, but states cbg's vary from 90-220.  She takes meds as rx'ed.  She has not recently taken glipizide. Past Medical History:  Diagnosis Date  . Allergic rhinitis   . Anemia   . Anxiety   . Depression   . Diabetes mellitus   . Hyperlipidemia   . Hypertension     Past Surgical History:  Procedure Laterality Date  . CHOLECYSTECTOMY    . LUMBAR LAMINECTOMY      Social History   Socioeconomic History  . Marital status: Married    Spouse name: Not on file  . Number of children: Not on file  . Years of education: Not on file  . Highest education level: Not on file  Occupational History  . Occupation: Corporate treasurer    Comment: [employer]  Tobacco Use  . Smoking status: Never Smoker  . Smokeless tobacco: Never Used  Substance and Sexual Activity  . Alcohol use: No  . Drug use: No  . Sexual activity: Not on file  Other Topics Concern  . Not on file  Social History Narrative  . Not on file   Social Determinants of Health   Financial Resource Strain: Not on file  Food Insecurity: Not on file  Transportation Needs: Not on file  Physical Activity: Not on file  Stress: Not on file  Social Connections: Not on file  Intimate Partner Violence: Not on file    Current Outpatient Medications on File Prior to Visit  Medication Sig Dispense Refill  . amLODipine (NORVASC) 10 MG tablet TAKE 1 TABLET(10 MG) BY MOUTH DAILY 90 tablet 3  . aspirin EC 81 MG tablet Take 1 tablet (81 mg total) by mouth daily. 90 tablet  11  . atorvastatin (LIPITOR) 20 MG tablet TAKE 1 TABLET(20 MG) BY MOUTH DAILY 90 tablet 3  . benazepril (LOTENSIN) 40 MG tablet 1 Tablet by mouth daily. 90 tablet 0  . diclofenac (VOLTAREN) 75 MG EC tablet Take 75 mg by mouth 2 (two) times daily as needed.    . fluticasone (FLONASE) 50 MCG/ACT nasal spray Place 2 sprays into both nostrils daily. 16 g 6  . glucose blood (ONETOUCH VERIO) test strip Use to check blood sugars twice a day 100 each 11  . hydrochlorothiazide (HYDRODIURIL) 25 MG tablet Take 1 tablet (25 mg total) by mouth daily. Annual appt due in June must see provider for future refills 90 tablet 0  . meloxicam (MOBIC) 15 MG tablet Take 1 tablet (15 mg total) by mouth daily. 30 tablet 0  . methocarbamol (ROBAXIN) 500 MG tablet Take 1 tablet (500 mg total) by mouth every 8 (eight) hours as needed. 30 tablet 0  . Multiple Vitamin (MULTI-VITAMIN) tablet Take 1 tablet by mouth daily.    Letta Pate DELICA LANCETS 33G MISC Use to help check blood sugars twice a day 100 each 5  . RESTASIS 0.05 % ophthalmic emulsion 1 drop 2 (two) times daily.     No current facility-administered medications on file prior to visit.  Allergies  Allergen Reactions  . Crestor [Rosuvastatin]     Elevated liver enzymes    Family History  Problem Relation Age of Onset  . Diabetes Other        1st degree relative  . Heart attack Mother   . Lung cancer Father     BP 124/80 (BP Location: Right Arm, Patient Position: Sitting, Cuff Size: Large)   Pulse 80   Ht 5\' 7"  (1.702 m)   Wt 227 lb 3.2 oz (103.1 kg)   LMP 08/06/2015   SpO2 95%   BMI 35.58 kg/m   Review of Systems Denies n/v/heartburn    Objective:   Physical Exam VITAL SIGNS:  See vs page GENERAL: no distress Pulses: dorsalis pedis intact bilat.   MSK: no deformity of the feet CV: no leg edema Skin:  no ulcer on the feet.  normal color and temp on the feet. Neuro: sensation is intact to touch on the feet   Lab Results  Component  Value Date   CREATININE 1.31 (H) 12/13/2019   BUN 15 12/13/2019   NA 135 12/13/2019   K 3.7 12/13/2019   CL 98 12/13/2019   CO2 30 12/13/2019   A1c=7.7%    Assessment & Plan:  Type 2 DM: uncontrolled CRI: we need to minimize metformin  Patient Instructions  check your blood sugar once a day.  vary the time of day when you check, between before the 3 meals, and at bedtime.  also check if you have symptoms of your blood sugar being too high or too low.  please keep a record of the readings and bring it to your next appointment here (or you can bring the meter itself).  You can write it on any piece of paper.  please call 02/12/2020 sooner if your blood sugar goes below 70, or if most of your readings are over 200.   We will need to take this complex situation in stages Please decrease the metformin to the morning only I have sent a prescription to your pharmacy, to increase the Trulicity again.  Please come back for a follow-up appointment in 3 months.

## 2020-12-08 ENCOUNTER — Other Ambulatory Visit (INDEPENDENT_AMBULATORY_CARE_PROVIDER_SITE_OTHER): Payer: BC Managed Care – PPO

## 2020-12-08 DIAGNOSIS — E1165 Type 2 diabetes mellitus with hyperglycemia: Secondary | ICD-10-CM | POA: Diagnosis not present

## 2020-12-08 DIAGNOSIS — E559 Vitamin D deficiency, unspecified: Secondary | ICD-10-CM

## 2020-12-08 LAB — HEPATIC FUNCTION PANEL
ALT: 18 U/L (ref 0–35)
AST: 18 U/L (ref 0–37)
Albumin: 4.6 g/dL (ref 3.5–5.2)
Alkaline Phosphatase: 104 U/L (ref 39–117)
Bilirubin, Direct: 0.1 mg/dL (ref 0.0–0.3)
Total Bilirubin: 0.4 mg/dL (ref 0.2–1.2)
Total Protein: 7.8 g/dL (ref 6.0–8.3)

## 2020-12-08 LAB — BASIC METABOLIC PANEL
BUN: 12 mg/dL (ref 6–23)
CO2: 29 mEq/L (ref 19–32)
Calcium: 10.2 mg/dL (ref 8.4–10.5)
Chloride: 100 mEq/L (ref 96–112)
Creatinine, Ser: 1.12 mg/dL (ref 0.40–1.20)
GFR: 54.34 mL/min — ABNORMAL LOW (ref >60.00)
Glucose, Bld: 162 mg/dL — ABNORMAL HIGH (ref 70–99)
Potassium: 3.6 mEq/L (ref 3.5–5.1)
Sodium: 138 mEq/L (ref 135–145)

## 2020-12-08 LAB — VITAMIN D 25 HYDROXY (VIT D DEFICIENCY, FRACTURES): VITD: 26.97 ng/mL — ABNORMAL LOW (ref 30.00–100.00)

## 2020-12-08 LAB — LIPID PANEL
Cholesterol: 128 mg/dL (ref 0–200)
HDL: 42.2 mg/dL (ref >39.00)
LDL Cholesterol: 60 mg/dL (ref 0–99)
NonHDL: 86.2
Total CHOL/HDL Ratio: 3
Triglycerides: 129 mg/dL (ref 0.0–149.0)
VLDL: 25.8 mg/dL (ref 0.0–40.0)

## 2020-12-08 LAB — HEMOGLOBIN A1C: Hgb A1c MFr Bld: 7.9 % — ABNORMAL HIGH (ref 4.6–6.5)

## 2020-12-13 ENCOUNTER — Other Ambulatory Visit: Payer: Self-pay

## 2020-12-13 ENCOUNTER — Encounter: Payer: Self-pay | Admitting: Emergency Medicine

## 2020-12-13 ENCOUNTER — Ambulatory Visit
Admission: EM | Admit: 2020-12-13 | Discharge: 2020-12-13 | Disposition: A | Discharge status: Home / Self Care | Payer: BC Managed Care – PPO | Attending: Emergency Medicine | Admitting: Emergency Medicine

## 2020-12-13 DIAGNOSIS — J069 Acute upper respiratory infection, unspecified: Secondary | ICD-10-CM

## 2020-12-13 MED ORDER — BENZONATATE 200 MG PO CAPS: 200.0000 mg | ORAL_CAPSULE | Freq: Three times a day (TID) | ORAL | 0 refills | Status: AC | PRN

## 2020-12-13 MED ORDER — LORATADINE 10 MG PO TABS: 10.0000 mg | ORAL_TABLET | Freq: Every day | ORAL | 0 refills | Status: DC

## 2020-12-13 MED ORDER — DM-GUAIFENESIN ER 30-600 MG PO TB12: 1.0000 | ORAL_TABLET | Freq: Two times a day (BID) | ORAL | 0 refills | Status: DC

## 2020-12-13 NOTE — ED Provider Notes (Signed)
[facility]    CSN: [id number] Arrival date & time: 12/13/20  1011      History   Chief Complaint Chief Complaint  Patient presents with   Sore Throat    HPI Jessica Daniels is a 60 y.o. female history of hypertension, hyperlipidemia, DM type II, presenting today for evaluation of sore throat.  Reports cough congestion and sore throat over the past 3 days.  Denies fevers chills or body aches.  Denies nausea vomiting or diarrhea.  Denies history of any lung problems, is prone to developing bronchitis with URIs.  Denies any wheezing chest tightness or shortness of breath of recently.  Using nasal spray and cough medicine over-the-counter without relief  HPI  Past Medical History:  Diagnosis Date   Allergic rhinitis    Anemia    Anxiety    Depression    Diabetes mellitus    Hyperlipidemia    Hypertension     Patient Active Problem List   Diagnosis Date Noted   Low mean corpuscular volume (MCV) 08/04/2020   Hip bursitis, left 12/16/2019   Bilateral leg cramps 12/16/2019   Vitamin D deficiency 12/13/2019   Dysuria 03/13/2018   Cellulitis of right hand 02/09/2018   Blepharitis of right upper eyelid 02/09/2018   Fatigue 06/03/2017   Wheezing 07/13/2016   Cough 11/13/2015   Ganglion of left wrist 12/05/2014   Primary osteoarthritis of left knee 10/30/2014   Diabetes (HCC) 06/04/2014   Pain in both feet 02/20/2014   Headache(784.0) 11/01/2013   Low back pain 01/30/2013   Encounter for well adult exam with abnormal findings 01/22/2011   Palpitations 04/02/2008   Hyperlipidemia 07/27/2007   Iron deficiency anemia 04/12/2007   Anxiety state 04/12/2007   DEPRESSION 04/12/2007   ALLERGIC RHINITIS 04/12/2007   Essential hypertension 02/22/2007    Past Surgical History:  Procedure Laterality Date   CHOLECYSTECTOMY     LUMBAR LAMINECTOMY      OB History   No obstetric history on file.      Home Medications    Prior to Admission medications    Medication Sig Start Date End Date Taking? Authorizing Provider  benzonatate (TESSALON) 200 MG capsule Take 1 capsule (200 mg total) by mouth 3 (three) times daily as needed for up to 7 days for cough. 12/13/20 12/20/20 Yes Airi Copado C, PA-C  dextromethorphan-guaiFENesin (MUCINEX DM) 30-600 MG 12hr tablet Take 1 tablet by mouth 2 (two) times daily. 12/13/20  Yes Iverson Sees C, PA-C  loratadine (CLARITIN) 10 MG tablet Take 1 tablet (10 mg total) by mouth daily. 12/13/20  Yes Korin Setzler C, PA-C  amLODipine (NORVASC) 10 MG tablet TAKE 1 TABLET(10 MG) BY MOUTH DAILY 08/21/20   Corwin Levins, MD  aspirin EC 81 MG tablet Take 1 tablet (81 mg total) by mouth daily. 10/13/16   Corwin Levins, MD  atorvastatin (LIPITOR) 20 MG tablet TAKE 1 TABLET(20 MG) BY MOUTH DAILY 12/31/19   Corwin Levins, MD  benazepril (LOTENSIN) 40 MG tablet 1 Tablet by mouth daily. 09/23/20   Corwin Levins, MD  diclofenac (VOLTAREN) 75 MG EC tablet Take 75 mg by mouth 2 (two) times daily as needed. 09/03/19   [provider]  Dulaglutide (TRULICITY) 4.5 MG/0.5ML SOPN Inject 4.5 mg as directed once a week. 11/26/20   Romero Belling, MD  fluticasone [facility]) 50 MCG/ACT nasal spray Place 2 sprays into both nostrils daily. 09/30/17   Olive Bass, FNP  glucose blood (ONETOUCH VERIO)  test strip Use to check blood sugars twice a day 08/21/20   Corwin Levins, MD  hydrochlorothiazide (HYDRODIURIL) 25 MG tablet Take 1 tablet (25 mg total) by mouth daily. Annual appt due in June must see provider for future refills 11/13/20   Corwin Levins, MD  meloxicam (MOBIC) 15 MG tablet Take 1 tablet (15 mg total) by mouth daily. 07/16/19   Corwin Levins, MD  metFORMIN (GLUCOPHAGE) 1000 MG tablet Take 1 tablet (1,000 mg total) by mouth daily with breakfast. 11/26/20   Romero Belling, MD  methocarbamol (ROBAXIN) 500 MG tablet Take 1 tablet (500 mg total) by mouth every 8 (eight) hours as needed. 06/14/19   Corwin Levins, MD  Multiple  Vitamin (MULTI-VITAMIN) tablet Take 1 tablet by mouth daily.    [provider]  Dola Argyle LANCETS 33G MISC Use to help check blood sugars twice a day 05/18/18   Corwin Levins, MD  RESTASIS 0.05 % ophthalmic emulsion 1 drop 2 (two) times daily. 01/02/20   [provider]    Family History Family History  Problem Relation Age of Onset   Diabetes Other        1st degree relative   Heart attack Mother    Lung cancer Father     Social History Social History   Tobacco Use   Smoking status: Never   Smokeless tobacco: Never  Substance Use Topics   Alcohol use: No   Drug use: No     Allergies   Crestor [rosuvastatin]   Review of Systems Review of Systems  Constitutional:  Negative for activity change, appetite change, chills, fatigue and fever.  HENT:  Positive for congestion and sore throat. Negative for ear pain, rhinorrhea, sinus pressure and trouble swallowing.   Eyes:  Negative for discharge and redness.  Respiratory:  Positive for cough. Negative for chest tightness and shortness of breath.   Cardiovascular:  Negative for chest pain.  Gastrointestinal:  Negative for abdominal pain, diarrhea, nausea and vomiting.  Musculoskeletal:  Negative for myalgias.  Skin:  Negative for rash.  Neurological:  Negative for dizziness, light-headedness and headaches.    Physical Exam Triage Vital Signs ED Triage Vitals [12/13/20 1031]  Enc Vitals Group     BP 116/75     Pulse Rate 77     Resp 20     Temp 98.4 F (36.9 C)     Temp Source Oral     SpO2 97 %     Weight      Height      Head Circumference      Peak Flow      Pain Score      Pain Loc      Pain Edu?      Excl. in GC?    No data found.  Updated Vital Signs BP 116/75 (BP Location: Left Arm)   Pulse 77   Temp 98.4 F (36.9 C) (Oral)   Resp 20   LMP 08/06/2015   SpO2 97%   Visual Acuity Right Eye Distance:   Left Eye Distance:   Bilateral Distance:    Right Eye Near:   Left  Eye Near:    Bilateral Near:     Physical Exam Vitals and nursing note reviewed.  Constitutional:      Appearance: She is well-developed.     Comments: No acute distress  HENT:     Head: Normocephalic and atraumatic.     Ears:  Comments: Bilateral ears without tenderness to palpation of external auricle, tragus and mastoid, EAC's without erythema or swelling, TM's with good bony landmarks and cone of light. Non erythematous.      Nose: Nose normal.     Mouth/Throat:     Comments: Oral mucosa pink and moist, no tonsillar enlargement or exudate. Posterior pharynx patent and nonerythematous, no uvula deviation or swelling. Normal phonation.  Eyes:     Conjunctiva/sclera: Conjunctivae normal.  Cardiovascular:     Rate and Rhythm: Normal rate.  Pulmonary:     Effort: Pulmonary effort is normal. No respiratory distress.     Comments: Breathing comfortably at rest, CTABL, no wheezing, rales or other adventitious sounds auscultated  Abdominal:     General: There is no distension.  Musculoskeletal:        General: Normal range of motion.     Cervical back: Neck supple.  Skin:    General: Skin is warm and dry.  Neurological:     Mental Status: She is alert and oriented to person, place, and time.     UC Treatments / Results  Labs (all labs ordered are listed, but only abnormal results are displayed) Labs Reviewed  NOVEL CORONAVIRUS, NAA    EKG   Radiology No results found.  Procedures Procedures (including critical care time)  Medications Ordered in UC Medications - No data to display  Initial Impression / Assessment and Plan / UC Course  I have reviewed the triage vital signs and the nursing notes.  Pertinent labs & imaging results that were available during my care of the patient were reviewed by me and considered in my medical decision making (see chart for details).    Viral URI with cough-COVID test pending, recommending symptomatic and supportive care,  recommendations provided, rest and fluids.  Continue to monitor,Discussed strict return precautions. Patient verbalized understanding and is agreeable with plan.  Final Clinical Impressions(s) / UC Diagnoses   Final diagnoses:  Viral URI with cough     Discharge Instructions      COVID test pending, monitor MyChart for results Rest and fluids Continue nasal spray, add in daily Claritin to help with throat irritation, drainage Mucinex DM twice daily to further help with congestion and cough Tessalon every 8 hours for cough Follow-up if not improving or worsening     ED Prescriptions     Medication Sig Dispense Auth. Provider   benzonatate (TESSALON) 200 MG capsule Take 1 capsule (200 mg total) by mouth 3 (three) times daily as needed for up to 7 days for cough. 28 capsule Zaharah Amir C, PA-C   dextromethorphan-guaiFENesin (MUCINEX DM) 30-600 MG 12hr tablet Take 1 tablet by mouth 2 (two) times daily. 15 tablet Vadis Slabach C, PA-C   loratadine (CLARITIN) 10 MG tablet Take 1 tablet (10 mg total) by mouth daily. 15 tablet Zaydon Kinser, [place] C, PA-C      PDMP not reviewed this encounter.   Lew Dawes, PA-C 12/13/20 1118

## 2020-12-13 NOTE — Discharge Instructions (Addendum)
COVID test pending, monitor MyChart for results Rest and fluids Continue nasal spray, add in daily Claritin to help with throat irritation, drainage Mucinex DM twice daily to further help with congestion and cough Tessalon every 8 hours for cough Follow-up if not improving or worsening

## 2020-12-13 NOTE — ED Triage Notes (Signed)
Pt here for sore throat and cough x 3 days; denies fever

## 2020-12-14 LAB — NOVEL CORONAVIRUS, NAA: SARS-CoV-2, NAA: NOT DETECTED

## 2020-12-14 LAB — SARS-COV-2, NAA 2 DAY TAT

## 2020-12-16 ENCOUNTER — Ambulatory Visit: Payer: BC Managed Care – PPO | Admitting: Internal Medicine

## 2020-12-16 ENCOUNTER — Encounter: Payer: Self-pay | Admitting: Internal Medicine

## 2020-12-16 ENCOUNTER — Other Ambulatory Visit: Payer: Self-pay

## 2020-12-16 VITALS — BP 110/76 | HR 73 | Temp 97.9°F | Ht 67.0 in | Wt 223.6 lb

## 2020-12-16 DIAGNOSIS — R252 Cramp and spasm: Secondary | ICD-10-CM

## 2020-12-16 DIAGNOSIS — I10 Essential (primary) hypertension: Secondary | ICD-10-CM

## 2020-12-16 DIAGNOSIS — E538 Deficiency of other specified B group vitamins: Secondary | ICD-10-CM

## 2020-12-16 DIAGNOSIS — E1165 Type 2 diabetes mellitus with hyperglycemia: Secondary | ICD-10-CM

## 2020-12-16 DIAGNOSIS — E559 Vitamin D deficiency, unspecified: Secondary | ICD-10-CM

## 2020-12-16 MED ORDER — BENAZEPRIL HCL 40 MG PO TABS: ORAL_TABLET | ORAL | 3 refills | Status: DC

## 2020-12-16 MED ORDER — AMLODIPINE BESYLATE 10 MG PO TABS: ORAL_TABLET | ORAL | 3 refills | Status: DC

## 2020-12-16 MED ORDER — THERA-D 4000 100 MCG (4000 UT) PO TABS: ORAL_TABLET | ORAL | 99 refills | Status: DC

## 2020-12-16 MED ORDER — ATORVASTATIN CALCIUM 20 MG PO TABS: ORAL_TABLET | ORAL | 3 refills | Status: DC

## 2020-12-16 MED ORDER — HYDROCHLOROTHIAZIDE 12.5 MG PO CAPS: 12.5000 mg | ORAL_CAPSULE | Freq: Every day | ORAL | 3 refills | Status: DC

## 2020-12-16 NOTE — Addendum Note (Signed)
Addended by: Corwin Levins on: 12/16/2020 09:37 PM   Modules accepted: Orders

## 2020-12-16 NOTE — Assessment & Plan Note (Signed)
Also likely to improve with decrased hct 12.5 mg qd,  to f/u any worsening symptoms or concerns

## 2020-12-16 NOTE — Progress Notes (Signed)
Patient ID: Jessica Daniels, female   DOB: 1960/09/26, 60 y.o.   MRN: [id number]        Chief Complaint: dizzy, htn, lov vit d, and DM       HPI:  Jessica Daniels is a 60 y.o. female here after lost 20 lbs recently with trulicity tx and DM management doing well;  Pt denies polydipsia, polyuria, or new focal neuro s/s, but has mild near daily episodes of orthostatic symptoms on current BP meds as well.   Taking Vit D 2000 u qd. Also has occasional leg cramps at night occasionally severe.   Pt denies fever, wt loss, night sweats, loss of appetite, or other constitutional symptoms        Wt Readings from Last 3 Encounters:  12/16/20 223 lb 9.6 oz (101.4 kg)  11/26/20 227 lb 3.2 oz (103.1 kg)  08/26/20 228 lb (103.4 kg)   BP Readings from Last 3 Encounters:  12/16/20 110/76  12/13/20 116/75  11/26/20 124/80         Past Medical History:  Diagnosis Date   Allergic rhinitis    Anemia    Anxiety    Depression    Diabetes mellitus    Hyperlipidemia    Hypertension    Past Surgical History:  Procedure Laterality Date   CHOLECYSTECTOMY     LUMBAR LAMINECTOMY      reports that she has never smoked. She has never used smokeless tobacco. She reports that she does not drink alcohol and does not use drugs. family history includes Diabetes in an other family member; Heart attack in her mother; Lung cancer in her father. Allergies  Allergen Reactions   Crestor [Rosuvastatin]     Elevated liver enzymes   Current Outpatient Medications on File Prior to Visit  Medication Sig Dispense Refill   aspirin EC 81 MG tablet Take 1 tablet (81 mg total) by mouth daily. 90 tablet 11   benzonatate (TESSALON) 200 MG capsule Take 1 capsule (200 mg total) by mouth 3 (three) times daily as needed for up to 7 days for cough. 28 capsule 0   dextromethorphan-guaiFENesin (MUCINEX DM) 30-600 MG 12hr tablet Take 1 tablet by mouth 2 (two) times daily. 15 tablet 0   diclofenac (VOLTAREN) 75 MG EC tablet Take 75  mg by mouth 2 (two) times daily as needed.     Dulaglutide (TRULICITY) 4.5 MG/0.5ML SOPN Inject 4.5 mg as directed once a week. 6 mL 3   fluticasone (FLONASE) 50 MCG/ACT nasal spray Place 2 sprays into both nostrils daily. 16 g 6   glucose blood (ONETOUCH VERIO) test strip Use to check blood sugars twice a day 100 each 11   loratadine (CLARITIN) 10 MG tablet Take 1 tablet (10 mg total) by mouth daily. 15 tablet 0   meloxicam (MOBIC) 15 MG tablet Take 1 tablet (15 mg total) by mouth daily. 30 tablet 0   metFORMIN (GLUCOPHAGE) 1000 MG tablet Take 1 tablet (1,000 mg total) by mouth daily with breakfast. 90 tablet 3   methocarbamol (ROBAXIN) 500 MG tablet Take 1 tablet (500 mg total) by mouth every 8 (eight) hours as needed. 30 tablet 0   Multiple Vitamin (MULTI-VITAMIN) tablet Take 1 tablet by mouth daily.     ONETOUCH DELICA LANCETS 33G MISC Use to help check blood sugars twice a day 100 each 5   RESTASIS 0.05 % ophthalmic emulsion 1 drop 2 (two) times daily.     No current facility-administered medications on file  prior to visit.        ROS:  All others reviewed and negative.  Objective        PE:  BP 110/76 (BP Location: Left Arm, Patient Position: Sitting, Cuff Size: Large)   Pulse 73   Temp 97.9 F (36.6 C) (Oral)   Ht 5\' 7"  (1.702 m)   Wt 223 lb 9.6 oz (101.4 kg)   LMP 08/06/2015   SpO2 99%   BMI 35.02 kg/m                 Constitutional: Pt appears in NAD               HENT: Head: NCAT.                Right Ear: External ear normal.                 Left Ear: External ear normal.                Eyes: . Pupils are equal, round, and reactive to light. Conjunctivae and EOM are normal               Nose: without d/c or deformity               Neck: Neck supple. Gross normal ROM               Cardiovascular: Normal rate and regular rhythm.                 Pulmonary/Chest: Effort normal and breath sounds without rales or wheezing.                Abd:  Soft, NT, ND, + BS, no  organomegaly               Neurological: Pt is alert. At baseline orientation, motor grossly intact               Skin: Skin is warm. No rashes, no other new lesions, LE edema - none               Psychiatric: Pt behavior is normal without agitation   Micro: none  Cardiac tracings I have personally interpreted today:  none  Pertinent Radiological findings (summarize): none   Lab Results  Component Value Date   WBC 7.5 08/04/2020   HGB 12.5 08/04/2020   HCT 39.9 08/04/2020   PLT 371.0 08/04/2020   GLUCOSE 162 (H) 12/08/2020   CHOL 128 12/08/2020   TRIG 129.0 12/08/2020   HDL 42.20 12/08/2020   LDLDIRECT 137.4 10/20/2009   LDLCALC 60 12/08/2020   ALT 18 12/08/2020   AST 18 12/08/2020   NA 138 12/08/2020   K 3.6 12/08/2020   CL 100 12/08/2020   CREATININE 1.12 12/08/2020   BUN 12 12/08/2020   CO2 29 12/08/2020   TSH 3.52 08/04/2020   HGBA1C 7.9 (H) 12/08/2020   MICROALBUR 5.8 (H) 08/04/2020   Assessment/Plan:  Jessica Daniels is a 60 y.o. Black or African American [2] female with  has a past medical history of Allergic rhinitis, Anemia, Anxiety, Depression, Diabetes mellitus, Hyperlipidemia, and Hypertension.  Essential hypertension Appears overcontrolled due to orthostatic symptoms, for decreased hct 12.5 qd, cont other rmeds - amlodipine , lotension,   BP Readings from Last 3 Encounters:  12/16/20 110/76  12/13/20 116/75  11/26/20 124/80    Bilateral leg cramps Also likely to improve with decrased hct 12.5 mg qd,  to  f/u any worsening symptoms or concerns  Diabetes improveing with DM management, but unclear as to why a1c incresaing Lab Results  Component Value Date   HGBA1C 7.9 (H) 12/08/2020  to f/u endo as planned  Vitamin D deficiency Taking vit d 2000 u qd, still low  Last vitamin D Lab Results  Component Value Date   VD25OH 26.97 (L) 12/08/2020   Ok to increase to 4000 u qd  Followup: Return in about 6 months (around 06/17/2021).  Oliver Barre, MD 12/16/2020 9:35 PM [facility]

## 2020-12-16 NOTE — Assessment & Plan Note (Signed)
Appears overcontrolled due to orthostatic symptoms, for decreased hct 12.5 qd, cont other rmeds - amlodipine , lotension,   BP Readings from Last 3 Encounters:  12/16/20 110/76  12/13/20 116/75  11/26/20 124/80

## 2020-12-16 NOTE — Patient Instructions (Signed)
Ok to decrease the HCT fluid pill to 12.5 mg per day (half of the 25 mg pill) - I also sent a new prescription  Ok to increase the Vitamin D to 4000 units per day  Please continue all other medications as before, and refills have been done if requested.  Please have the pharmacy call with any other refills you may need.  Please continue your efforts at being more active, low cholesterol diet, and weight control.  Please keep your appointments with your specialists as you may have planned - Dr Everardo All about aug 2022  Please make an Appointment to return in 6 months, or sooner if needed, also with Lab Appointment for testing done 3-5 days before at the FIRST FLOOR Lab (so this is for TWO appointments - please see the scheduling desk as you leave)  Due to the ongoing Covid 19 pandemic, our lab now requires an appointment for any labs done at our office.  If you need labs done and do not have an appointment, please call our office ahead of time to schedule before presenting to the lab for your testing.

## 2020-12-16 NOTE — Assessment & Plan Note (Signed)
improveing with DM management, but unclear as to why a1c incresaing Lab Results  Component Value Date   HGBA1C 7.9 (H) 12/08/2020  to f/u endo as planned

## 2020-12-16 NOTE — Assessment & Plan Note (Signed)
Taking vit d 2000 u qd, still low  Last vitamin D Lab Results  Component Value Date   VD25OH 26.97 (L) 12/08/2020   Ok to increase to 4000 u qd

## 2020-12-26 LAB — HM MAMMOGRAPHY

## 2021-01-16 ENCOUNTER — Encounter: Payer: Self-pay | Admitting: Internal Medicine

## 2021-02-02 ENCOUNTER — Other Ambulatory Visit: Payer: Self-pay | Admitting: Physical Medicine and Rehabilitation

## 2021-02-02 DIAGNOSIS — M79604 Pain in right leg: Secondary | ICD-10-CM

## 2021-02-26 ENCOUNTER — Ambulatory Visit: Payer: BC Managed Care – PPO | Admitting: Endocrinology

## 2021-02-27 ENCOUNTER — Ambulatory Visit: Payer: BC Managed Care – PPO | Admitting: Endocrinology

## 2021-02-27 ENCOUNTER — Other Ambulatory Visit: Payer: BC Managed Care – PPO

## 2021-02-27 ENCOUNTER — Other Ambulatory Visit: Payer: Self-pay

## 2021-02-27 VITALS — BP 120/76 | HR 74 | Ht 67.0 in | Wt 219.6 lb

## 2021-02-27 DIAGNOSIS — E1165 Type 2 diabetes mellitus with hyperglycemia: Secondary | ICD-10-CM | POA: Diagnosis not present

## 2021-02-27 LAB — POCT GLYCOSYLATED HEMOGLOBIN (HGB A1C): Hemoglobin A1C: 6.9 % — AB (ref 4.0–5.6)

## 2021-02-27 MED ORDER — DAPAGLIFLOZIN PROPANEDIOL 10 MG PO TABS: 10.0000 mg | ORAL_TABLET | Freq: Every day | ORAL | 3 refills | Status: DC

## 2021-02-27 MED ORDER — GLIPIZIDE ER 2.5 MG PO TB24: 2.5000 mg | ORAL_TABLET | Freq: Every day | ORAL | 3 refills | Status: DC

## 2021-02-27 NOTE — Progress Notes (Signed)
Subjective:    Patient ID: Jessica Daniels, female    DOB: [birth year], 60 y.o.   MRN: [id number]  HPI Pt returns for f/u of diabetes mellitus: DM type: 2 Dx'ed: 2000 Complications: stage 3 CRI Therapy: Trulicity and metformin GDM: never DKA: never Severe hypoglycemia: never Pancreatitis: never Pancreatic imaging: normal on 2006 [place] SDOH: none Other: she has never been on insulin Interval history: pt states she feels well in general.  no cbg record, but states cbg's vary from 70-210.  She takes meds as rx'ed.  Pt says she takes glipizide 5/d. Past Medical History:  Diagnosis Date   Allergic rhinitis    Anemia    Anxiety    Depression    Diabetes mellitus    Hyperlipidemia    Hypertension     Past Surgical History:  Procedure Laterality Date   CHOLECYSTECTOMY     LUMBAR LAMINECTOMY      Social History   Socioeconomic History   Marital status: Married    Spouse name: Not on file   Number of children: Not on file   Years of education: Not on file   Highest education level: Not on file  Occupational History   Occupation: Corporate treasurer    Comment: [employer]  Tobacco Use   Smoking status: Never   Smokeless tobacco: Never  Substance and Sexual Activity   Alcohol use: No   Drug use: No   Sexual activity: Not on file  Other Topics Concern   Not on file  Social History Narrative   Not on file   Social Determinants of Health   Financial Resource Strain: Not on file  Food Insecurity: Not on file  Transportation Needs: Not on file  Physical Activity: Not on file  Stress: Not on file  Social Connections: Not on file  Intimate Partner Violence: Not on file    Current Outpatient Medications on File Prior to Visit  Medication Sig Dispense Refill   amLODipine (NORVASC) 10 MG tablet TAKE 1 TABLET(10 MG) BY MOUTH DAILY 90 tablet 3   aspirin EC 81 MG tablet Take 1 tablet (81 mg total) by mouth daily. 90 tablet 11   atorvastatin (LIPITOR) 20 MG tablet  1 tab by mouth once daily 90 tablet 3   benazepril (LOTENSIN) 40 MG tablet 1 Tablet by mouth daily. 90 tablet 3   Cholecalciferol (THERA-D 4000) 100 MCG (4000 UT) TABS 1 tab by mouth once daily 30 tablet 99   diclofenac (VOLTAREN) 75 MG EC tablet Take 75 mg by mouth 2 (two) times daily as needed.     Dulaglutide (TRULICITY) 4.5 MG/0.5ML SOPN Inject 4.5 mg as directed once a week. 6 mL 3   fluticasone (FLONASE) 50 MCG/ACT nasal spray Place 2 sprays into both nostrils daily. 16 g 6   glucose blood (ONETOUCH VERIO) test strip Use to check blood sugars twice a day 100 each 11   hydrochlorothiazide (MICROZIDE) 12.5 MG capsule Take 1 capsule (12.5 mg total) by mouth daily. 90 capsule 3   loratadine (CLARITIN) 10 MG tablet Take 1 tablet (10 mg total) by mouth daily. 15 tablet 0   meloxicam (MOBIC) 15 MG tablet Take 1 tablet (15 mg total) by mouth daily. 30 tablet 0   metFORMIN (GLUCOPHAGE) 1000 MG tablet Take 1 tablet (1,000 mg total) by mouth daily with breakfast. 90 tablet 3   methocarbamol (ROBAXIN) 500 MG tablet Take 1 tablet (500 mg total) by mouth every 8 (eight) hours as needed. 30 tablet  0   Multiple Vitamin (MULTI-VITAMIN) tablet Take 1 tablet by mouth daily.     ONETOUCH DELICA LANCETS 33G MISC Use to help check blood sugars twice a day 100 each 5   RESTASIS 0.05 % ophthalmic emulsion 1 drop 2 (two) times daily.     No current facility-administered medications on file prior to visit.    Allergies  Allergen Reactions   Crestor [Rosuvastatin]     Elevated liver enzymes    Family History  Problem Relation Age of Onset   Diabetes Other        1st degree relative   Heart attack Mother    Lung cancer Father     BP 120/76 (BP Location: Right Arm, Patient Position: Sitting, Cuff Size: Normal)   Pulse 74   Ht 5\' 7"  (1.702 m)   Wt 219 lb 9.6 oz (99.6 kg)   LMP 08/06/2015   SpO2 95%   BMI 34.39 kg/m    Review of Systems Denies n/v/hb    Objective:   Physical Exam Pulses:  dorsalis pedis intact bilat.   MSK: no deformity of the feet CV: no leg edema Skin:  no ulcer on the feet.  normal color and temp on the feet. Neuro: sensation is intact to touch on the feet  Lab Results  Component Value Date   CREATININE 1.12 12/08/2020   BUN 12 12/08/2020   NA 138 12/08/2020   K 3.6 12/08/2020   CL 100 12/08/2020   CO2 29 12/08/2020    A1c=6.9%    Assessment & Plan:  Type 2 DM: overcontrolled, for this SU-containing regimen.   Patient Instructions  check your blood sugar once a day.  vary the time of day when you check, between before the 3 meals, and at bedtime.  also check if you have symptoms of your blood sugar being too high or too low.  please keep a record of the readings and bring it to your next appointment here (or you can bring the meter itself).  You can write it on any piece of paper.  please call 02/07/2021 sooner if your blood sugar goes below 70, or if most of your readings are over 200.   We will need to take this complex situation in stages. I have sent a prescription to your pharmacy, to reduce the glipizide, and to add [place].  Please continue the same other medications Please come back for a follow-up appointment in 3 months

## 2021-02-27 NOTE — Patient Instructions (Addendum)
check your blood sugar once a day.  vary the time of day when you check, between before the 3 meals, and at bedtime.  also check if you have symptoms of your blood sugar being too high or too low.  please keep a record of the readings and bring it to your next appointment here (or you can bring the meter itself).  You can write it on any piece of paper.  please call us sooner if your blood sugar goes below 70, or if most of your readings are over 200.   We will need to take this complex situation in stages. I have sent a prescription to your pharmacy, to reduce the glipizide, and to add [place].  Please continue the same other medications Please come back for a follow-up appointment in 3 months

## 2021-03-10 ENCOUNTER — Ambulatory Visit
Admission: RE | Admit: 2021-03-10 | Discharge: 2021-03-10 | Disposition: A | Discharge status: Home / Self Care | Payer: BC Managed Care – PPO | Source: Ambulatory Visit | Attending: Physical Medicine and Rehabilitation | Admitting: Physical Medicine and Rehabilitation

## 2021-03-10 ENCOUNTER — Other Ambulatory Visit: Payer: Self-pay

## 2021-03-10 DIAGNOSIS — M79604 Pain in right leg: Secondary | ICD-10-CM

## 2021-06-01 ENCOUNTER — Ambulatory Visit: Payer: BC Managed Care – PPO | Admitting: Endocrinology

## 2021-06-01 ENCOUNTER — Other Ambulatory Visit: Payer: Self-pay

## 2021-06-01 VITALS — BP 120/78 | HR 76 | Ht 67.0 in | Wt 220.0 lb

## 2021-06-01 DIAGNOSIS — E1165 Type 2 diabetes mellitus with hyperglycemia: Secondary | ICD-10-CM | POA: Diagnosis not present

## 2021-06-01 LAB — POCT GLYCOSYLATED HEMOGLOBIN (HGB A1C): Hemoglobin A1C: 6.6 % — AB (ref 4.0–5.6)

## 2021-06-01 MED ORDER — DAPAGLIFLOZIN PROPANEDIOL 10 MG PO TABS: 10.0000 mg | ORAL_TABLET | Freq: Every day | ORAL | 3 refills | Status: DC

## 2021-06-01 MED ORDER — METFORMIN HCL 1000 MG PO TABS: 1000.0000 mg | ORAL_TABLET | Freq: Every day | ORAL | 3 refills | Status: DC

## 2021-06-01 MED ORDER — REPAGLINIDE 0.5 MG PO TABS: 0.5000 mg | ORAL_TABLET | Freq: Two times a day (BID) | ORAL | 3 refills | Status: DC

## 2021-06-01 MED ORDER — TRULICITY 4.5 MG/0.5ML ~~LOC~~ SOAJ
4.5000 mg | SUBCUTANEOUS | 3 refills | Status: DC
Start: 2021-06-01 — End: 2021-08-11

## 2021-06-01 NOTE — Patient Instructions (Addendum)
check your blood sugar once a day.  vary the time of day when you check, between before the 3 meals, and at bedtime.  also check if you have symptoms of your blood sugar being too high or too low.  please keep a record of the readings and bring it to your next appointment here (or you can bring the meter itself).  You can write it on any piece of paper.  please call us sooner if your blood sugar goes below 70, or if most of your readings are over 200.  I have sent a prescription to your pharmacy, to change the glipizide to repaglinide.  Please continue the same other medications Please come back for a follow-up appointment in 3 months.

## 2021-06-01 NOTE — Progress Notes (Signed)
Subjective:    Patient ID: Jessica Daniels, female    DOB: 03-Feb-1961, 60 y.o.   MRN: [id number]  HPI Pt returns for f/u of diabetes mellitus: DM type: 2 Dx'ed: 2000 Complications: stage 3 CRI Therapy: Trulicity and metformin GDM: never DKA: never Severe hypoglycemia: never Pancreatitis: never Pancreatic imaging: normal on 2006 [place] SDOH: none Other: she has never been on insulin.   Interval history: pt states she feels well in general.  no cbg record, but states cbg's vary from 70-170.  She takes meds as rx'ed.  Pt says she takes glipizide 5/d. She seldom has hypoglycemia, and these episodes are mild.   Past Medical History:  Diagnosis Date   Allergic rhinitis    Anemia    Anxiety    Depression    Diabetes mellitus    Hyperlipidemia    Hypertension     Past Surgical History:  Procedure Laterality Date   CHOLECYSTECTOMY     LUMBAR LAMINECTOMY      Social History   Socioeconomic History   Marital status: Married    Spouse name: Not on file   Number of children: Not on file   Years of education: Not on file   Highest education level: Not on file  Occupational History   Occupation: Corporate treasurer    Comment: [employer]  Tobacco Use   Smoking status: Never   Smokeless tobacco: Never  Substance and Sexual Activity   Alcohol use: No   Drug use: No   Sexual activity: Not on file  Other Topics Concern   Not on file  Social History Narrative   Not on file   Social Determinants of Health   Financial Resource Strain: Not on file  Food Insecurity: Not on file  Transportation Needs: Not on file  Physical Activity: Not on file  Stress: Not on file  Social Connections: Not on file  Intimate Partner Violence: Not on file    Current Outpatient Medications on File Prior to Visit  Medication Sig Dispense Refill   amLODipine (NORVASC) 10 MG tablet TAKE 1 TABLET(10 MG) BY MOUTH DAILY 90 tablet 3   aspirin EC 81 MG tablet Take 1 tablet (81 mg total) by  mouth daily. 90 tablet 11   atorvastatin (LIPITOR) 20 MG tablet 1 tab by mouth once daily 90 tablet 3   benazepril (LOTENSIN) 40 MG tablet 1 Tablet by mouth daily. 90 tablet 3   Cholecalciferol (THERA-D 4000) 100 MCG (4000 UT) TABS 1 tab by mouth once daily 30 tablet 99   diclofenac (VOLTAREN) 75 MG EC tablet Take 75 mg by mouth 2 (two) times daily as needed.     fluticasone (FLONASE) 50 MCG/ACT nasal spray Place 2 sprays into both nostrils daily. 16 g 6   glucose blood (ONETOUCH VERIO) test strip Use to check blood sugars twice a day 100 each 11   hydrochlorothiazide (MICROZIDE) 12.5 MG capsule Take 1 capsule (12.5 mg total) by mouth daily. 90 capsule 3   loratadine (CLARITIN) 10 MG tablet Take 1 tablet (10 mg total) by mouth daily. 15 tablet 0   meloxicam (MOBIC) 15 MG tablet Take 1 tablet (15 mg total) by mouth daily. 30 tablet 0   methocarbamol (ROBAXIN) 500 MG tablet Take 1 tablet (500 mg total) by mouth every 8 (eight) hours as needed. 30 tablet 0   Multiple Vitamin (MULTI-VITAMIN) tablet Take 1 tablet by mouth daily.     ONETOUCH DELICA LANCETS 33G MISC Use to help check  blood sugars twice a day 100 each 5   RESTASIS 0.05 % ophthalmic emulsion 1 drop 2 (two) times daily.     No current facility-administered medications on file prior to visit.    Allergies  Allergen Reactions   Crestor [Rosuvastatin]     Elevated liver enzymes    Family History  Problem Relation Age of Onset   Diabetes Other        1st degree relative   Heart attack Mother    Lung cancer Father     BP 120/78   Pulse 76   Ht 5\' 7"  (1.702 m)   Wt 220 lb (99.8 kg)   LMP 08/06/2015   SpO2 94%   BMI 34.46 kg/m    Review of Systems     Objective:   Physical Exam    Lab Results  Component Value Date   HGBA1C 6.6 (A) 06/01/2021      Assessment & Plan:  Type 2 DM Hypoglycemia, due to glipizide.    Patient Instructions  check your blood sugar once a day.  vary the time of day when you check,  between before the 3 meals, and at bedtime.  also check if you have symptoms of your blood sugar being too high or too low.  please keep a record of the readings and bring it to your next appointment here (or you can bring the meter itself).  You can write it on any piece of paper.  please call 06/03/2021 sooner if your blood sugar goes below 70, or if most of your readings are over 200.  I have sent a prescription to your pharmacy, to change the glipizide to repaglinide.  Please continue the same other medications Please come back for a follow-up appointment in 3 months.

## 2021-06-18 ENCOUNTER — Other Ambulatory Visit: Payer: Self-pay

## 2021-06-18 ENCOUNTER — Encounter: Payer: Self-pay | Admitting: Internal Medicine

## 2021-06-18 ENCOUNTER — Ambulatory Visit: Payer: BC Managed Care – PPO | Admitting: Internal Medicine

## 2021-06-18 VITALS — BP 116/74 | HR 78 | Temp 97.4°F | Ht 67.0 in | Wt 214.0 lb

## 2021-06-18 DIAGNOSIS — E785 Hyperlipidemia, unspecified: Secondary | ICD-10-CM

## 2021-06-18 DIAGNOSIS — F32A Depression, unspecified: Secondary | ICD-10-CM

## 2021-06-18 DIAGNOSIS — N1831 Chronic kidney disease, stage 3a: Secondary | ICD-10-CM

## 2021-06-18 DIAGNOSIS — Z1211 Encounter for screening for malignant neoplasm of colon: Secondary | ICD-10-CM

## 2021-06-18 DIAGNOSIS — E559 Vitamin D deficiency, unspecified: Secondary | ICD-10-CM

## 2021-06-18 DIAGNOSIS — E538 Deficiency of other specified B group vitamins: Secondary | ICD-10-CM

## 2021-06-18 DIAGNOSIS — I10 Essential (primary) hypertension: Secondary | ICD-10-CM

## 2021-06-18 DIAGNOSIS — E1165 Type 2 diabetes mellitus with hyperglycemia: Secondary | ICD-10-CM

## 2021-06-18 NOTE — Patient Instructions (Addendum)
Please take OTC Vitamin D3 at 2000 units per day, indefinitely  We have discussed the Cardiac CT Score test to measure the calcification level (if any) in your heart arteries.  This test has been ordered in our Computer System, so please call [facility] directly, as they prefer this, at [phone] to be scheduled.  Please continue all other medications as before, and refills have been done if requested.  Please have the pharmacy call with any other refills you may need.  Please continue your efforts at being more active, low cholesterol diet, and weight control.  You are otherwise up to date with prevention measures today.  Please keep your appointments with your specialists as you may have planned  You will be contacted regarding the referral for: colonoscopy  Please make an Appointment to return in 3 months, or sooner if needed, also with Labs to be done a few days ahead at the [facility]

## 2021-06-18 NOTE — Progress Notes (Signed)
Patient ID: Jessica Daniels, female   DOB: Sep 11, 1960, 60 y.o.   MRN: [id number]        Chief Complaint: follow up HTN, HLD and hyperglycemia, ckd, depression, low vit d       HPI:  Jessica Daniels is a 60 y.o. female here overall doing ok; Pt denies chest pain, increased sob or doe, wheezing, orthopnea, PND, increased LE swelling, palpitations, dizziness or syncope.   Pt denies polydipsia, polyuria, or new focal neuro s/s.   Pt denies fever, wt loss, night sweats, loss of appetite, or other constitutional symptoms  Conts to see endo for DM.  Denies worsening depressive symptoms, suicidal ideation, or panic.  Due for colonoscopy  Not taking vit d       Wt Readings from Last 3 Encounters:  06/18/21 214 lb (97.1 kg)  06/01/21 220 lb (99.8 kg)  02/27/21 219 lb 9.6 oz (99.6 kg)   BP Readings from Last 3 Encounters:  06/18/21 116/74  06/01/21 120/78  02/27/21 120/76         Past Medical History:  Diagnosis Date   Allergic rhinitis    Anemia    Anxiety    Depression    Diabetes mellitus    Hyperlipidemia    Hypertension    Past Surgical History:  Procedure Laterality Date   CHOLECYSTECTOMY     LUMBAR LAMINECTOMY      reports that she has never smoked. She has never used smokeless tobacco. She reports that she does not drink alcohol and does not use drugs. family history includes Diabetes in an other family member; Heart attack in her mother; Lung cancer in her father. Allergies  Allergen Reactions   Crestor [Rosuvastatin]     Elevated liver enzymes   Current Outpatient Medications on File Prior to Visit  Medication Sig Dispense Refill   amLODipine (NORVASC) 10 MG tablet TAKE 1 TABLET(10 MG) BY MOUTH DAILY 90 tablet 3   aspirin EC 81 MG tablet Take 1 tablet (81 mg total) by mouth daily. 90 tablet 11   atorvastatin (LIPITOR) 20 MG tablet 1 tab by mouth once daily 90 tablet 3   benazepril (LOTENSIN) 40 MG tablet 1 Tablet by mouth daily. 90 tablet 3   Cholecalciferol (THERA-D  4000) 100 MCG (4000 UT) TABS 1 tab by mouth once daily 30 tablet 99   dapagliflozin propanediol (FARXIGA) 10 MG TABS tablet Take 1 tablet (10 mg total) by mouth daily before breakfast. 90 tablet 3   diclofenac (VOLTAREN) 75 MG EC tablet Take 75 mg by mouth 2 (two) times daily as needed.     Dulaglutide (TRULICITY) 4.5 MG/0.5ML SOPN Inject 4.5 mg as directed once a week. 6 mL 3   fluticasone (FLONASE) 50 MCG/ACT nasal spray Place 2 sprays into both nostrils daily. 16 g 6   glucose blood (ONETOUCH VERIO) test strip Use to check blood sugars twice a day 100 each 11   hydrochlorothiazide (MICROZIDE) 12.5 MG capsule Take 1 capsule (12.5 mg total) by mouth daily. 90 capsule 3   loratadine (CLARITIN) 10 MG tablet Take 1 tablet (10 mg total) by mouth daily. 15 tablet 0   meloxicam (MOBIC) 15 MG tablet Take 1 tablet (15 mg total) by mouth daily. 30 tablet 0   metFORMIN (GLUCOPHAGE) 1000 MG tablet Take 1 tablet (1,000 mg total) by mouth daily with breakfast. 90 tablet 3   methocarbamol (ROBAXIN) 500 MG tablet Take 1 tablet (500 mg total) by mouth every 8 (eight) hours as  needed. 30 tablet 0   Multiple Vitamin (MULTI-VITAMIN) tablet Take 1 tablet by mouth daily.     ONETOUCH DELICA LANCETS 33G MISC Use to help check blood sugars twice a day 100 each 5   repaglinide (PRANDIN) 0.5 MG tablet Take 1 tablet (0.5 mg total) by mouth 2 (two) times daily before a meal. 180 tablet 3   RESTASIS 0.05 % ophthalmic emulsion 1 drop 2 (two) times daily.     No current facility-administered medications on file prior to visit.        ROS:  All others reviewed and negative.  Objective        PE:  BP 116/74 (BP Location: Right Arm, Patient Position: Sitting, Cuff Size: Large)    Pulse 78    Temp (!) 97.4 F (36.3 C) (Oral)    Ht 5\' 7"  (1.702 m)    Wt 214 lb (97.1 kg)    LMP 08/06/2015    SpO2 100%    BMI 33.52 kg/m                 Constitutional: Pt appears in NAD               HENT: Head: NCAT.                Right  Ear: External ear normal.                 Left Ear: External ear normal.                Eyes: . Pupils are equal, round, and reactive to light. Conjunctivae and EOM are normal               Nose: without d/c or deformity               Neck: Neck supple. Gross normal ROM               Cardiovascular: Normal rate and regular rhythm.                 Pulmonary/Chest: Effort normal and breath sounds without rales or wheezing.                Abd:  Soft, NT, ND, + BS, no organomegaly               Neurological: Pt is alert. At baseline orientation, motor grossly intact               Skin: Skin is warm. No rashes, no other new lesions, LE edema - none               Psychiatric: Pt behavior is normal without agitation   Micro: none  Cardiac tracings I have personally interpreted today:  none  Pertinent Radiological findings (summarize): none   Lab Results  Component Value Date   WBC 7.5 08/04/2020   HGB 12.5 08/04/2020   HCT 39.9 08/04/2020   PLT 371.0 08/04/2020   GLUCOSE 162 (H) 12/08/2020   CHOL 128 12/08/2020   TRIG 129.0 12/08/2020   HDL 42.20 12/08/2020   LDLDIRECT 137.4 10/20/2009   LDLCALC 60 12/08/2020   ALT 18 12/08/2020   AST 18 12/08/2020   NA 138 12/08/2020   K 3.6 12/08/2020   CL 100 12/08/2020   CREATININE 1.12 12/08/2020   BUN 12 12/08/2020   CO2 29 12/08/2020   TSH 3.52 08/04/2020   HGBA1C 6.6 (A) 06/01/2021   MICROALBUR 5.8 (H) 08/04/2020  Assessment/Plan:  [name] is a 60 y.o. Black or African American [2] female with  has a past medical history of Allergic rhinitis, Anemia, Anxiety, Depression, Diabetes mellitus, Hyperlipidemia, and Hypertension.  Hyperlipidemia Lab Results  Component Value Date   LDLCALC 60 12/08/2020   Stable, pt to continue current statin lipitor 20, also for cardiac CT score   Depression Stable overall, declines need for change in tx or counseling referral  Essential hypertension BP Readings from Last 3 Encounters:   06/18/21 116/74  06/01/21 120/78  02/27/21 120/76   Stable, pt to continue medical treatment lotension, hct, norvasc,   Diabetes Lab Results  Component Value Date   HGBA1C 6.6 (A) 06/01/2021   Stable, pt to continue current medical treatment farxiga, trulicity, metformin   Vitamin D deficiency Last vitamin D Lab Results  Component Value Date   VD25OH 26.97 (L) 12/08/2020   Low, to start oral replacement   CKD (chronic kidney disease) stage 3, GFR 30-59 ml/min (HCC) Lab Results  Component Value Date   CREATININE 1.12 12/08/2020   Stable overall, cont to avoid nephrotoxins  Followup: Return in about 3 months (around 09/16/2021).  Oliver Barre, MD 06/21/2021 7:56 PM [facility]  Primary Care - [facility]

## 2021-06-21 ENCOUNTER — Encounter: Payer: Self-pay | Admitting: Internal Medicine

## 2021-06-21 DIAGNOSIS — N183 Chronic kidney disease, stage 3 unspecified: Secondary | ICD-10-CM | POA: Insufficient documentation

## 2021-06-21 MED ORDER — MELOXICAM 15 MG PO TABS: 15.0000 mg | ORAL_TABLET | Freq: Every day | ORAL | 1 refills | Status: DC

## 2021-06-21 MED ORDER — ATORVASTATIN CALCIUM 20 MG PO TABS
ORAL_TABLET | ORAL | 3 refills | Status: DC
Start: 2021-06-21 — End: 2022-10-11

## 2021-06-21 MED ORDER — BENAZEPRIL HCL 40 MG PO TABS: ORAL_TABLET | ORAL | 3 refills | Status: DC

## 2021-06-21 MED ORDER — AMLODIPINE BESYLATE 10 MG PO TABS: ORAL_TABLET | ORAL | 3 refills | Status: DC

## 2021-06-21 MED ORDER — HYDROCHLOROTHIAZIDE 12.5 MG PO CAPS: 12.5000 mg | ORAL_CAPSULE | Freq: Every day | ORAL | 3 refills | Status: DC

## 2021-06-21 NOTE — Assessment & Plan Note (Signed)
BP Readings from Last 3 Encounters:  06/18/21 116/74  06/01/21 120/78  02/27/21 120/76   Stable, pt to continue medical treatment lotension, hct, norvasc,

## 2021-06-21 NOTE — Assessment & Plan Note (Signed)
Lab Results  Component Value Date   HGBA1C 6.6 (A) 06/01/2021   Stable, pt to continue current medical treatment farxiga, trulicity, metformin

## 2021-06-21 NOTE — Assessment & Plan Note (Signed)
Stable overall, declines need for change in tx or counseling referral 

## 2021-06-21 NOTE — Assessment & Plan Note (Signed)
Lab Results  Component Value Date   CREATININE 1.12 12/08/2020   Stable overall, cont to avoid nephrotoxins

## 2021-06-21 NOTE — Assessment & Plan Note (Addendum)
Lab Results  Component Value Date   LDLCALC 60 12/08/2020   Stable, pt to continue current statin lipitor 20, also for cardiac CT score

## 2021-06-21 NOTE — Assessment & Plan Note (Signed)
Last vitamin D Lab Results  Component Value Date   VD25OH 26.97 (L) 12/08/2020   Low, to start oral replacement

## 2021-08-11 ENCOUNTER — Other Ambulatory Visit: Payer: Self-pay | Admitting: Endocrinology

## 2021-08-11 ENCOUNTER — Telehealth: Payer: Self-pay

## 2021-08-11 MED ORDER — OZEMPIC (2 MG/DOSE) 8 MG/3ML ~~LOC~~ SOPN: 2.0000 mg | PEN_INJECTOR | SUBCUTANEOUS | 3 refills | Status: DC

## 2021-08-11 NOTE — Telephone Encounter (Signed)
Message sent thru MyChart letting the pt know about the change to Ozempic

## 2021-08-11 NOTE — Telephone Encounter (Signed)
Pt LVM stating that Trulicity 4.5 is out-of-stock and wanted to know if her dosage could be lowered so that dosage could get her medication. She stated that the pharmacy has the lower dosage in stock.  Please Advise

## 2021-09-01 ENCOUNTER — Ambulatory Visit: Payer: BC Managed Care – PPO | Admitting: Endocrinology

## 2021-09-01 ENCOUNTER — Other Ambulatory Visit: Payer: Self-pay

## 2021-09-01 VITALS — BP 134/74 | HR 76 | Ht 67.0 in | Wt 211.0 lb

## 2021-09-01 DIAGNOSIS — E1165 Type 2 diabetes mellitus with hyperglycemia: Secondary | ICD-10-CM

## 2021-09-01 LAB — POCT GLYCOSYLATED HEMOGLOBIN (HGB A1C): Hemoglobin A1C: 6.8 % — AB (ref 4.0–5.6)

## 2021-09-01 MED ORDER — METFORMIN HCL ER 500 MG PO TB24: 2000.0000 mg | ORAL_TABLET | Freq: Every day | ORAL | 3 refills | Status: DC

## 2021-09-01 NOTE — Patient Instructions (Signed)
check your blood sugar once a day.  vary the time of day when you check, between before the 3 meals, and at bedtime.  also check if you have symptoms of your blood sugar being too high or too low.  please keep a record of the readings and bring it to your next appointment here (or you can bring the meter itself).  You can write it on any piece of paper.  please call us sooner if your blood sugar goes below 70, or if most of your readings are over 200.  I have sent a prescription to your pharmacy, to change the metformin to extended-release.   Please continue the same other medications.   Please come back for a follow-up appointment in May.

## 2021-09-01 NOTE — Progress Notes (Signed)
Subjective:    Patient ID: Jessica Daniels, female    DOB: 1960/07/22, 61 y.o.   MRN: [id number]  HPI Pt returns for f/u of diabetes mellitus: DM type: 2 Dx'ed: [id number] Complications: stage 3 CRI Therapy: Ozempic and 3 oral meds.   GDM: never DKA: never Severe hypoglycemia: never Pancreatitis: never Pancreatic imaging: normal on 2006 [place] SDOH: none Other: she has never been on insulin.   Interval history: no cbg record, but states cbg's vary from 60-215.  She takes meds as rx'ed.  Pt reports mild nausea.    Past Medical History:  Diagnosis Date   Allergic rhinitis    Anemia    Anxiety    Depression    Diabetes mellitus    Hyperlipidemia    Hypertension     Past Surgical History:  Procedure Laterality Date   CHOLECYSTECTOMY     LUMBAR LAMINECTOMY      Social History   Socioeconomic History   Marital status: Married    Spouse name: Not on file   Number of children: Not on file   Years of education: Not on file   Highest education level: Not on file  Occupational History   Occupation: Curator    Comment: [employer]  Tobacco Use   Smoking status: Never   Smokeless tobacco: Never  Substance and Sexual Activity   Alcohol use: No   Drug use: No   Sexual activity: Not on file  Other Topics Concern   Not on file  Social History Narrative   Not on file   Social Determinants of Health   Financial Resource Strain: Not on file  Food Insecurity: Not on file  Transportation Needs: Not on file  Physical Activity: Not on file  Stress: Not on file  Social Connections: Not on file  Intimate Partner Violence: Not on file    Current Outpatient Medications on File Prior to Visit  Medication Sig Dispense Refill   amLODipine (NORVASC) 10 MG tablet TAKE 1 TABLET(10 MG) BY MOUTH DAILY 90 tablet 3   aspirin EC 81 MG tablet Take 1 tablet (81 mg total) by mouth daily. 90 tablet 11   atorvastatin (LIPITOR) 20 MG tablet 1 tab by mouth once daily 90 tablet 3    benazepril (LOTENSIN) 40 MG tablet 1 Tablet by mouth daily. 90 tablet 3   Cholecalciferol (THERA-D 4000) 100 MCG (4000 UT) TABS 1 tab by mouth once daily 30 tablet 99   dapagliflozin propanediol (FARXIGA) 10 MG TABS tablet Take 1 tablet (10 mg total) by mouth daily before breakfast. 90 tablet 3   diclofenac (VOLTAREN) 75 MG EC tablet Take 75 mg by mouth 2 (two) times daily as needed.     fluticasone (FLONASE) 50 MCG/ACT nasal spray Place 2 sprays into both nostrils daily. 16 g 6   glucose blood (ONETOUCH VERIO) test strip Use to check blood sugars twice a day 100 each 11   hydrochlorothiazide (MICROZIDE) 12.5 MG capsule Take 1 capsule (12.5 mg total) by mouth daily. 90 capsule 3   loratadine (CLARITIN) 10 MG tablet Take 1 tablet (10 mg total) by mouth daily. 15 tablet 0   meloxicam (MOBIC) 15 MG tablet Take 1 tablet (15 mg total) by mouth daily. 90 tablet 1   methocarbamol (ROBAXIN) 500 MG tablet Take 1 tablet (500 mg total) by mouth every 8 (eight) hours as needed. 30 tablet 0   Multiple Vitamin (MULTI-VITAMIN) tablet Take 1 tablet by mouth daily.  ONETOUCH DELICA LANCETS 99991111 MISC Use to help check blood sugars twice a day 100 each 5   repaglinide (PRANDIN) 0.5 MG tablet Take 1 tablet (0.5 mg total) by mouth 2 (two) times daily before a meal. 180 tablet 3   RESTASIS 0.05 % ophthalmic emulsion 1 drop 2 (two) times daily.     Semaglutide, 2 MG/DOSE, (OZEMPIC, 2 MG/DOSE,) 8 MG/3ML SOPN Inject 2 mg into the skin once a week. 9 mL 3   No current facility-administered medications on file prior to visit.    Allergies  Allergen Reactions   Crestor [Rosuvastatin]     Elevated liver enzymes    Family History  Problem Relation Age of Onset   Diabetes Other        1st degree relative   Heart attack Mother    Lung cancer Father     BP 134/74    Pulse 76    Ht 5\' 7"  (1.702 m)    Wt 211 lb (95.7 kg)    LMP 08/06/2015    SpO2 98%    BMI 33.05 kg/m       Objective:   Physical  Exam    Lab Results  Component Value Date   HGBA1C 6.8 (A) 09/01/2021      Assessment & Plan:  Type 2 DM: well-controlled. Nausea, due to Ozempic/metformin  We discussed.  She chooses to continue.    Patient Instructions  check your blood sugar once a day.  vary the time of day when you check, between before the 3 meals, and at bedtime.  also check if you have symptoms of your blood sugar being too high or too low.  please keep a record of the readings and bring it to your next appointment here (or you can bring the meter itself).  You can write it on any piece of paper.  please call us sooner if your blood sugar goes below 70, or if most of your readings are over 200.  I have sent a prescription to your pharmacy, to change the metformin to extended-release.   Please continue the same other medications.   Please come back for a follow-up appointment in May.

## 2021-09-15 ENCOUNTER — Other Ambulatory Visit (INDEPENDENT_AMBULATORY_CARE_PROVIDER_SITE_OTHER): Payer: BC Managed Care – PPO

## 2021-09-15 DIAGNOSIS — E538 Deficiency of other specified B group vitamins: Secondary | ICD-10-CM

## 2021-09-15 DIAGNOSIS — E559 Vitamin D deficiency, unspecified: Secondary | ICD-10-CM

## 2021-09-15 DIAGNOSIS — E1165 Type 2 diabetes mellitus with hyperglycemia: Secondary | ICD-10-CM | POA: Diagnosis not present

## 2021-09-15 LAB — LIPID PANEL
Cholesterol: 152 mg/dL (ref 0–200)
HDL: 50.1 mg/dL (ref >39.00)
LDL Cholesterol: 68 mg/dL (ref 0–99)
NonHDL: 101.78
Total CHOL/HDL Ratio: 3
Triglycerides: 168 mg/dL — ABNORMAL HIGH (ref 0.0–149.0)
VLDL: 33.6 mg/dL (ref 0.0–40.0)

## 2021-09-15 LAB — URINALYSIS, ROUTINE W REFLEX MICROSCOPIC
Bilirubin Urine: NEGATIVE
Hgb urine dipstick: NEGATIVE
Ketones, ur: NEGATIVE
Leukocytes,Ua: NEGATIVE
Nitrite: NEGATIVE
RBC / HPF: NONE SEEN (ref 0–?)
Specific Gravity, Urine: 1.01 (ref 1.000–1.030)
Total Protein, Urine: NEGATIVE
Urine Glucose: >=1000 — AB
Urobilinogen, UA: 0.2 (ref 0.0–1.0)
pH: 6 (ref 5.0–8.0)

## 2021-09-15 LAB — VITAMIN B12: Vitamin B-12: 293 pg/mL (ref 211–911)

## 2021-09-15 LAB — HEPATIC FUNCTION PANEL
ALT: 13 U/L (ref 0–35)
AST: 16 U/L (ref 0–37)
Albumin: 4.8 g/dL (ref 3.5–5.2)
Alkaline Phosphatase: 118 U/L — ABNORMAL HIGH (ref 39–117)
Bilirubin, Direct: 0.1 mg/dL (ref 0.0–0.3)
Total Bilirubin: 0.4 mg/dL (ref 0.2–1.2)
Total Protein: 7.8 g/dL (ref 6.0–8.3)

## 2021-09-15 LAB — CBC WITH DIFFERENTIAL/PLATELET
Basophils Absolute: 0 10*3/uL (ref 0.0–0.1)
Basophils Relative: 0.4 % (ref 0.0–3.0)
Eosinophils Absolute: 0 10*3/uL (ref 0.0–0.7)
Eosinophils Relative: 0.8 % (ref 0.0–5.0)
HCT: 40.2 % (ref 36.0–46.0)
Hemoglobin: 12.7 g/dL (ref 12.0–15.0)
Lymphocytes Relative: 27.8 % (ref 12.0–46.0)
Lymphs Abs: 1.6 10*3/uL (ref 0.7–4.0)
MCHC: 31.6 g/dL (ref 30.0–36.0)
MCV: 68.9 fl — ABNORMAL LOW (ref 78.0–100.0)
Monocytes Absolute: 0.4 10*3/uL (ref 0.1–1.0)
Monocytes Relative: 7.5 % (ref 3.0–12.0)
Neutro Abs: 3.6 10*3/uL (ref 1.4–7.7)
Neutrophils Relative %: 63.5 % (ref 43.0–77.0)
Platelets: 380 10*3/uL (ref 150.0–400.0)
RBC: 5.84 Mil/uL — ABNORMAL HIGH (ref 3.87–5.11)
RDW: 16 % — ABNORMAL HIGH (ref 11.5–15.5)
WBC: 5.7 10*3/uL (ref 4.0–10.5)

## 2021-09-15 LAB — TSH: TSH: 1.3 u[IU]/mL (ref 0.35–5.50)

## 2021-09-15 LAB — BASIC METABOLIC PANEL
BUN: 9 mg/dL (ref 6–23)
CO2: 28 mEq/L (ref 19–32)
Calcium: 10.4 mg/dL (ref 8.4–10.5)
Chloride: 101 mEq/L (ref 96–112)
Creatinine, Ser: 0.98 mg/dL (ref 0.40–1.20)
GFR: 63.44 mL/min (ref >60.00)
Glucose, Bld: 123 mg/dL — ABNORMAL HIGH (ref 70–99)
Potassium: 3.4 mEq/L — ABNORMAL LOW (ref 3.5–5.1)
Sodium: 139 mEq/L (ref 135–145)

## 2021-09-15 LAB — VITAMIN D 25 HYDROXY (VIT D DEFICIENCY, FRACTURES): VITD: 31.77 ng/mL (ref 30.00–100.00)

## 2021-09-15 LAB — MICROALBUMIN / CREATININE URINE RATIO
Creatinine,U: 71.1 mg/dL
Microalb Creat Ratio: 1.1 mg/g (ref 0.0–30.0)
Microalb, Ur: 0.8 mg/dL (ref 0.0–1.9)

## 2021-09-15 LAB — HEMOGLOBIN A1C: Hgb A1c MFr Bld: 7.2 % — ABNORMAL HIGH (ref 4.6–6.5)

## 2021-09-25 ENCOUNTER — Ambulatory Visit: Payer: BC Managed Care – PPO | Admitting: Internal Medicine

## 2021-10-12 ENCOUNTER — Encounter: Payer: Self-pay | Admitting: Internal Medicine

## 2021-10-12 ENCOUNTER — Ambulatory Visit: Payer: BC Managed Care – PPO | Admitting: Internal Medicine

## 2021-10-12 VITALS — BP 118/68 | HR 67 | Temp 98.2°F | Ht 67.0 in | Wt 204.0 lb

## 2021-10-12 DIAGNOSIS — I10 Essential (primary) hypertension: Secondary | ICD-10-CM | POA: Diagnosis not present

## 2021-10-12 DIAGNOSIS — Z1211 Encounter for screening for malignant neoplasm of colon: Secondary | ICD-10-CM

## 2021-10-12 DIAGNOSIS — N1831 Chronic kidney disease, stage 3a: Secondary | ICD-10-CM

## 2021-10-12 DIAGNOSIS — Z0001 Encounter for general adult medical examination with abnormal findings: Secondary | ICD-10-CM

## 2021-10-12 DIAGNOSIS — E559 Vitamin D deficiency, unspecified: Secondary | ICD-10-CM | POA: Diagnosis not present

## 2021-10-12 DIAGNOSIS — E1165 Type 2 diabetes mellitus with hyperglycemia: Secondary | ICD-10-CM

## 2021-10-12 DIAGNOSIS — E7800 Pure hypercholesterolemia, unspecified: Secondary | ICD-10-CM

## 2021-10-12 NOTE — Progress Notes (Signed)
Patient ID: Jessica Daniels, female   DOB: 05-11-1961, 61 y.o.   MRN: [id number] ? ? ? ?     Chief Complaint:: wellness exam and dm, htn with dizziness, low vit d, low K ? ?     HPI:  Jessica Daniels is a 61 y.o. female here for wellness exam; due for colonoscopy, decliens covid booster and shingrix, and tdap, o/w up to date ?         ?               Also has ahd several low sugars recently in the 90's but not felt symptomatic, not wanting to change tx for now.  Pt denies chest pain, increased sob or doe, wheezing, orthopnea, PND, increased LE swelling, palpitations, or syncope, but has fairly frequent dizziness, concerned BP at times may be too low.   Pt denies polydipsia, polyuria, or new focal neuro s/s.    Pt denies fever, wt loss, night sweats, loss of appetite, or other constitutional symptoms  Taking vit D 1000 u qd.  Has persistent low K and wanting to come off the K supplement.    ?  ?Wt Readings from Last 3 Encounters:  ?10/12/21 204 lb (92.5 kg)  ?09/01/21 211 lb (95.7 kg)  ?06/18/21 214 lb (97.1 kg)  ? ?BP Readings from Last 3 Encounters:  ?10/12/21 118/68  ?09/01/21 134/74  ?06/18/21 116/74  ? ?Immunization History  ?Administered Date(s) Administered  ? Moderna Sars-Covid-2 Vaccination 09/02/2019, 10/01/2019, 05/05/2020  ? ?Health Maintenance Due  ?Topic Date Due  ? COLONOSCOPY (Pts 45-30yrs Insurance coverage will need to be confirmed)  Never done  ? ?  ? ?Past Medical History:  ?Diagnosis Date  ? Allergic rhinitis   ? Anemia   ? Anxiety   ? Depression   ? Diabetes mellitus   ? Hyperlipidemia   ? Hypertension   ? ?Past Surgical History:  ?Procedure Laterality Date  ? CHOLECYSTECTOMY    ? LUMBAR LAMINECTOMY    ? ? reports that she has never smoked. She has never used smokeless tobacco. She reports that she does not drink alcohol and does not use drugs. ?family history includes Diabetes in an other family member; Heart attack in her mother; Lung cancer in her father. ?Allergies  ?Allergen Reactions  ?  Crestor [Rosuvastatin]   ?  Elevated liver enzymes  ? ?Current Outpatient Medications on File Prior to Visit  ?Medication Sig Dispense Refill  ? amLODipine (NORVASC) 10 MG tablet TAKE 1 TABLET(10 MG) BY MOUTH DAILY 90 tablet 3  ? aspirin EC 81 MG tablet Take 1 tablet (81 mg total) by mouth daily. 90 tablet 11  ? atorvastatin (LIPITOR) 20 MG tablet 1 tab by mouth once daily 90 tablet 3  ? benazepril (LOTENSIN) 40 MG tablet 1 Tablet by mouth daily. 90 tablet 3  ? Cholecalciferol (THERA-D 4000) 100 MCG (4000 UT) TABS 1 tab by mouth once daily 30 tablet 99  ? dapagliflozin propanediol (FARXIGA) 10 MG TABS tablet Take 1 tablet (10 mg total) by mouth daily before breakfast. 90 tablet 3  ? diclofenac (VOLTAREN) 75 MG EC tablet Take 75 mg by mouth 2 (two) times daily as needed.    ? fluticasone (FLONASE) 50 MCG/ACT nasal spray Place 2 sprays into both nostrils daily. 16 g 6  ? glucose blood (ONETOUCH VERIO) test strip Use to check blood sugars twice a day 100 each 11  ? loratadine (CLARITIN) 10 MG tablet Take 1 tablet (10 mg  total) by mouth daily. 15 tablet 0  ? meloxicam (MOBIC) 15 MG tablet Take 1 tablet (15 mg total) by mouth daily. 90 tablet 1  ? metFORMIN (GLUCOPHAGE-XR) 500 MG 24 hr tablet Take 4 tablets (2,000 mg total) by mouth daily with breakfast. 360 tablet 3  ? methocarbamol (ROBAXIN) 500 MG tablet Take 1 tablet (500 mg total) by mouth every 8 (eight) hours as needed. 30 tablet 0  ? Multiple Vitamin (MULTI-VITAMIN) tablet Take 1 tablet by mouth daily.    ? ONETOUCH DELICA LANCETS 33G MISC Use to help check blood sugars twice a day 100 each 5  ? repaglinide (PRANDIN) 0.5 MG tablet Take 1 tablet (0.5 mg total) by mouth 2 (two) times daily before a meal. 180 tablet 3  ? RESTASIS 0.05 % ophthalmic emulsion 1 drop 2 (two) times daily.    ? Semaglutide, 2 MG/DOSE, (OZEMPIC, 2 MG/DOSE,) 8 MG/3ML SOPN Inject 2 mg into the skin once a week. 9 mL 3  ? ?No current facility-administered medications on file prior to visit.   ? ?     ROS:  All others reviewed and negative. ? ?Objective  ? ?     PE:  BP 118/68 (BP Location: Right Arm, Patient Position: Sitting, Cuff Size: Large)   Pulse 67   Temp 98.2 ?F (36.8 ?C) (Oral)   Ht 5\' 7"  (1.702 m)   Wt 204 lb (92.5 kg)   LMP 08/06/2015   SpO2 100%   BMI 31.95 kg/m?  ? ?              Constitutional: Pt appears in NAD ?              HENT: Head: NCAT.  ?              Right Ear: External ear normal.   ?              Left Ear: External ear normal.  ?              Eyes: . Pupils are equal, round, and reactive to light. Conjunctivae and EOM are normal ?              Nose: without d/c or deformity ?              Neck: Neck supple. Gross normal ROM ?              Cardiovascular: Normal rate and regular rhythm.   ?              Pulmonary/Chest: Effort normal and breath sounds without rales or wheezing.  ?              Abd:  Soft, NT, ND, + BS, no organomegaly ?              Neurological: Pt is alert. At baseline orientation, motor grossly intact ?              Skin: Skin is warm. No rashes, no other new lesions, LE edema - none ?              Psychiatric: Pt behavior is normal without agitation  ? ?Micro: none ? ?Cardiac tracings I have personally interpreted today:  none ? ?Pertinent Radiological findings (summarize): none  ? ?Lab Results  ?Component Value Date  ? WBC 5.7 09/15/2021  ? HGB 12.7 09/15/2021  ? HCT 40.2 09/15/2021  ? PLT 380.0 09/15/2021  ? GLUCOSE 123 (  H) 09/15/2021  ? CHOL 152 09/15/2021  ? TRIG 168.0 (H) 09/15/2021  ? HDL 50.10 09/15/2021  ? LDLDIRECT 137.4 10/20/2009  ? LDLCALC 68 09/15/2021  ? ALT 13 09/15/2021  ? AST 16 09/15/2021  ? NA 139 09/15/2021  ? K 3.4 (L) 09/15/2021  ? CL 101 09/15/2021  ? CREATININE 0.98 09/15/2021  ? BUN 9 09/15/2021  ? CO2 28 09/15/2021  ? TSH 1.30 09/15/2021  ? HGBA1C 7.2 (H) 09/15/2021  ? MICROALBUR 0.8 09/15/2021  ? ?Assessment/Plan:  ?Jessica Daniels is a 61 y.o. Black or African American [2] female with  has a past medical history of  Allergic rhinitis, Anemia, Anxiety, Depression, Diabetes mellitus, Hyperlipidemia, and Hypertension. ? ?Encounter for well adult exam with abnormal findings ?Age and sex appropriate education and counseling updated with regular exercise and diet ?Referrals for preventative services - for colonoscopy ?Immunizations addressed - declines shingrix, covid booster, tdap ?Smoking counseling  - none needed ?Evidence for depression or other mood disorder - none significant ?Most recent labs reviewed. ?I have personally reviewed and have noted: ?1) the patient's medical and social history ?2) The patient's current medications and supplements ?3) The patient's height, weight, and BMI have been recorded in the chart ? ? ?CKD (chronic kidney disease) stage 3, GFR 30-59 ml/min (HCC) ?Lab Results  ?Component Value Date  ? CREATININE 0.98 09/15/2021  ? ?Stable overall, cont to avoid nephrotoxins ? ? ?Diabetes ?Lab Results  ?Component Value Date  ? HGBA1C 7.2 (H) 09/15/2021  ? ?Stable, pt to continue current medical treatment farxiga, metformin ? ? ?Essential hypertension ?BP Readings from Last 3 Encounters:  ?10/12/21 118/68  ?09/01/21 134/74  ?06/18/21 116/74  ? ?? Overcontrolled, d/c hct and K supplement, pt to continue medical treatment amlodipine ? ? ?Hyperlipidemia ?Lab Results  ?Component Value Date  ? LDLCALC 68 09/15/2021  ? ?Stable, pt to continue current statin lipitor 20 ? ? ?Vitamin D deficiency ?Last vitamin D ?Lab Results  ?Component Value Date  ? VD25OH 31.77 09/15/2021  ? ?Low to start oral replacement 2000 u qd ? ?Followup: Return in about 6 months (around 04/13/2022). ? ?Oliver Barre, MD 10/18/2021 4:39 PM ?[facility] ?[facility] ?Internal Medicine ?

## 2021-10-12 NOTE — Patient Instructions (Addendum)
Ok to stop the HCT fluid pill for BP as the BP seems to be lower with wt loss ? ?Ok to continue the same ozempic and Please continue all other medications as before ? ?Ok to increase the Vitamin D to 2000 units per day ? ?Please have the pharmacy call with any other refills you may need. ? ?Please continue your efforts at being more active, low cholesterol diet, and weight control. ? ?You will be contacted regarding the referral for: Gastroenterology for the colonoscopy ? ?You are otherwise up to date with prevention measures today. ? ?Please keep your appointments with your specialists as you may have planned ? ?Please make an Appointment to return in 6 months, or sooner if needed, also with Lab Appointment for testing done 3-5 days before at the FIRST FLOOR Lab (so this is for TWO appointments - please see the scheduling desk as you leave) ? ?Due to the ongoing Covid 19 pandemic, our lab now requires an appointment for any labs done at our office.  If you need labs done and do not have an appointment, please call our office ahead of time to schedule before presenting to the lab for your testing. ? ? ? ? ?

## 2021-10-13 ENCOUNTER — Encounter: Payer: Self-pay | Admitting: Internal Medicine

## 2021-10-18 ENCOUNTER — Encounter: Payer: Self-pay | Admitting: Internal Medicine

## 2021-10-18 NOTE — Assessment & Plan Note (Signed)
Lab Results  ?Component Value Date  ? HGBA1C 7.2 (H) 09/15/2021  ? ?Stable, pt to continue current medical treatment farxiga, metformin ? ?

## 2021-10-18 NOTE — Assessment & Plan Note (Signed)
Last vitamin D ?Lab Results  ?Component Value Date  ? VD25OH 31.77 09/15/2021  ? ?Low to start oral replacement 2000 u qd ?

## 2021-10-18 NOTE — Assessment & Plan Note (Signed)
Age and sex appropriate education and counseling updated with regular exercise and diet ?Referrals for preventative services - for colonoscopy ?Immunizations addressed - declines shingrix, covid booster, tdap ?Smoking counseling  - none needed ?Evidence for depression or other mood disorder - none significant ?Most recent labs reviewed. ?I have personally reviewed and have noted: ?1) the patient's medical and social history ?2) The patient's current medications and supplements ?3) The patient's height, weight, and BMI have been recorded in the chart ? ?

## 2021-10-18 NOTE — Assessment & Plan Note (Signed)
BP Readings from Last 3 Encounters:  ?10/12/21 118/68  ?09/01/21 134/74  ?06/18/21 116/74  ? ?? Overcontrolled, d/c hct and K supplement, pt to continue medical treatment amlodipine ? ?

## 2021-10-18 NOTE — Addendum Note (Signed)
Addended by: Corwin Levins on: 10/18/2021 04:42 PM ? ? Modules accepted: Orders ? ?

## 2021-10-18 NOTE — Assessment & Plan Note (Signed)
Lab Results  ?Component Value Date  ? LDLCALC 68 09/15/2021  ? ?Stable, pt to continue current statin lipitor 20 ? ?

## 2021-10-18 NOTE — Assessment & Plan Note (Signed)
Lab Results  Component Value Date   CREATININE 0.98 09/15/2021   Stable overall, cont to avoid nephrotoxins  

## 2021-11-20 ENCOUNTER — Ambulatory Visit: Payer: BC Managed Care – PPO | Admitting: Endocrinology

## 2022-02-24 ENCOUNTER — Telehealth: Payer: Self-pay | Admitting: Internal Medicine

## 2022-02-24 NOTE — Telephone Encounter (Signed)
Pt is requesting a refill on amLODipine (NORVASC) 10 MG tablet.  Pt would also like a refill on  dapagliflozin propanediol (FARXIGA) 10 MG TABS tablet  Semaglutide, 2 MG/DOSE, (OZEMPIC, 2 MG/DOSE,) 8 MG/3ML SOPN  She stated the [place] and Ozempic were prescribed by Dr. Romero Belling and she said that he quit so she is unable to got those filled by him.  [facility] [phone] - [place], [place] - [phone] AT [facility] [address] & [address] Phone:  [phone]  Fax:  [phone]

## 2022-03-02 MED ORDER — DAPAGLIFLOZIN PROPANEDIOL 10 MG PO TABS: 10.0000 mg | ORAL_TABLET | Freq: Every day | ORAL | 1 refills | Status: DC

## 2022-03-02 MED ORDER — OZEMPIC (2 MG/DOSE) 8 MG/3ML ~~LOC~~ SOPN: 2.0000 mg | PEN_INJECTOR | SUBCUTANEOUS | 3 refills | Status: DC

## 2022-03-02 MED ORDER — AMLODIPINE BESYLATE 10 MG PO TABS: ORAL_TABLET | ORAL | 1 refills | Status: DC

## 2022-03-02 NOTE — Telephone Encounter (Signed)
Ok refills done

## 2022-03-11 ENCOUNTER — Ambulatory Visit
Admission: EM | Admit: 2022-03-11 | Discharge: 2022-03-11 | Disposition: A | Discharge status: Home / Self Care | Payer: BC Managed Care – PPO | Attending: Physician Assistant | Admitting: Physician Assistant

## 2022-03-11 ENCOUNTER — Encounter: Payer: Self-pay | Admitting: Physician Assistant

## 2022-03-11 DIAGNOSIS — Z1152 Encounter for screening for COVID-19: Secondary | ICD-10-CM | POA: Diagnosis not present

## 2022-03-11 DIAGNOSIS — J069 Acute upper respiratory infection, unspecified: Secondary | ICD-10-CM | POA: Insufficient documentation

## 2022-03-11 LAB — RESP PANEL BY RT-PCR (FLU A&B, COVID) ARPGX2
Influenza A by PCR: NEGATIVE
Influenza B by PCR: NEGATIVE
SARS Coronavirus 2 by RT PCR: POSITIVE — AB

## 2022-03-11 NOTE — ED Triage Notes (Signed)
Pt presents to [facility] with co of body chills, aches, congestion. Pt st she has been doing alkersetzer for symptoms. Pt reports covid neg at home

## 2022-03-11 NOTE — ED Provider Notes (Signed)
[facility]    CSN: [id number] Arrival date & time: 03/11/22  0935      History   Chief Complaint No chief complaint on file.   HPI Jessica Daniels is a 61 y.o. female.   Patient here today for evaluation of body aches, chills and congestion that started yesterday.  She reports she has had some cough and chest congestion as well.  She has had low-grade fever with Tmax 99.9.  She took a COVID test at home that was negative.  She denies any nausea, vomiting or diarrhea.  She has taken Alka-Seltzer, Tylenol and ibuprofen with mild relief. She reports several of her friends she was on a recent trip with are also sick with similar symptoms.  The history is provided by the patient.    Past Medical History:  Diagnosis Date   Allergic rhinitis    Anemia    Anxiety    Depression    Diabetes mellitus    Hyperlipidemia    Hypertension     Patient Active Problem List   Diagnosis Date Noted   CKD (chronic kidney disease) stage 3, GFR 30-59 ml/min (HCC) 06/21/2021   Low mean corpuscular volume (MCV) 08/04/2020   Hip bursitis, left 12/16/2019   Bilateral leg cramps 12/16/2019   Vitamin D deficiency 12/13/2019   Dysuria 03/13/2018   Cellulitis of right hand 02/09/2018   Blepharitis of right upper eyelid 02/09/2018   Fatigue 06/03/2017   Wheezing 07/13/2016   Cough 11/13/2015   Ganglion of left wrist 12/05/2014   Primary osteoarthritis of left knee 10/30/2014   Diabetes ([place]) 06/04/2014   Pain in both feet 02/20/2014   Headache(784.0) 11/01/2013   Low back pain 01/30/2013   Encounter for well adult exam with abnormal findings 01/22/2011   Palpitations 04/02/2008   Hyperlipidemia 07/27/2007   Iron deficiency anemia 04/12/2007   Anxiety state 04/12/2007   Depression 04/12/2007   ALLERGIC RHINITIS 04/12/2007   Essential hypertension 02/22/2007    Past Surgical History:  Procedure Laterality Date   CHOLECYSTECTOMY     LUMBAR LAMINECTOMY      OB History    No obstetric history on file.      Home Medications    Prior to Admission medications   Medication Sig Start Date End Date Taking? Authorizing Provider  amLODipine (NORVASC) 10 MG tablet TAKE 1 TABLET(10 MG) BY MOUTH DAILY 03/02/22   Biagio Borg, MD  aspirin EC 81 MG tablet Take 1 tablet (81 mg total) by mouth daily. 10/13/16   Biagio Borg, MD  atorvastatin (LIPITOR) 20 MG tablet 1 tab by mouth once daily 06/21/21   Biagio Borg, MD  benazepril (LOTENSIN) 40 MG tablet 1 Tablet by mouth daily. 06/21/21   Biagio Borg, MD  Cholecalciferol (THERA-D 4000) 100 MCG (4000 UT) TABS 1 tab by mouth once daily 12/16/20   Biagio Borg, MD  dapagliflozin propanediol (FARXIGA) 10 MG TABS tablet Take 1 tablet (10 mg total) by mouth daily before breakfast. 03/02/22   Biagio Borg, MD  diclofenac (VOLTAREN) 75 MG EC tablet Take 75 mg by mouth 2 (two) times daily as needed. 09/03/19   [provider]  fluticasone (FLONASE) 50 MCG/ACT nasal spray Place 2 sprays into both nostrils daily. 09/30/17   Marrian Salvage, FNP  glucose blood [facility] VERIO) test strip Use to check blood sugars twice a day 08/21/20   Biagio Borg, MD  loratadine (CLARITIN) 10 MG tablet Take 1 tablet (  10 mg total) by mouth daily. 12/13/20   Wieters, Hallie C, PA-C  meloxicam (MOBIC) 15 MG tablet Take 1 tablet (15 mg total) by mouth daily. 06/21/21   Corwin Levins, MD  metFORMIN (GLUCOPHAGE-XR) 500 MG 24 hr tablet Take 4 tablets (2,000 mg total) by mouth daily with breakfast. 09/01/21   Romero Belling, MD  methocarbamol (ROBAXIN) 500 MG tablet Take 1 tablet (500 mg total) by mouth every 8 (eight) hours as needed. 06/14/19   Corwin Levins, MD  Multiple Vitamin (MULTI-VITAMIN) tablet Take 1 tablet by mouth daily.    [provider]  Dola Argyle LANCETS 33G MISC Use to help check blood sugars twice a day 05/18/18   Corwin Levins, MD  repaglinide (PRANDIN) 0.5 MG tablet Take 1 tablet (0.5 mg total) by mouth 2  (two) times daily before a meal. 06/01/21   Romero Belling, MD  RESTASIS 0.05 % ophthalmic emulsion 1 drop 2 (two) times daily. 01/02/20   [provider]  Semaglutide, 2 MG/DOSE, (OZEMPIC, 2 MG/DOSE,) 8 MG/3ML SOPN Inject 2 mg into the skin once a week. 03/02/22   Corwin Levins, MD    Family History Family History  Problem Relation Age of Onset   Diabetes Other        1st degree relative   Heart attack Mother    Lung cancer Father     Social History Social History   Tobacco Use   Smoking status: Never   Smokeless tobacco: Never  Substance Use Topics   Alcohol use: No   Drug use: No     Allergies   Crestor [rosuvastatin]   Review of Systems Review of Systems  Constitutional:  Positive for chills and fever.  HENT:  Positive for congestion and sore throat. Negative for ear pain.   Eyes:  Negative for discharge and redness.  Respiratory:  Positive for cough. Negative for shortness of breath and wheezing.   Gastrointestinal:  Negative for abdominal pain, diarrhea, nausea and vomiting.     Physical Exam Triage Vital Signs ED Triage Vitals  Enc Vitals Group     BP      Pulse      Resp      Temp      Temp src      SpO2      Weight      Height      Head Circumference      Peak Flow      Pain Score      Pain Loc      Pain Edu?      Excl. in GC?    No data found.  Updated Vital Signs BP (!) 140/95   Pulse 83   Temp 98.3 F (36.8 C)   Resp 18   LMP 08/06/2015   SpO2 98%   Physical Exam Vitals and nursing note reviewed.  Constitutional:      General: She is not in acute distress.    Appearance: Normal appearance. She is not ill-appearing.  HENT:     Head: Normocephalic and atraumatic.     Nose: Congestion present.     Mouth/Throat:     Mouth: Mucous membranes are moist.     Pharynx: No oropharyngeal exudate or posterior oropharyngeal erythema.  Eyes:     Conjunctiva/sclera: Conjunctivae normal.  Cardiovascular:     Rate and Rhythm: Normal  rate and regular rhythm.     Heart sounds: Normal heart sounds. No murmur heard. Pulmonary:  Effort: Pulmonary effort is normal. No respiratory distress.     Breath sounds: Normal breath sounds. No wheezing, rhonchi or rales.  Skin:    General: Skin is warm and dry.  Neurological:     Mental Status: She is alert.  Psychiatric:        Mood and Affect: Mood normal.        Thought Content: Thought content normal.      UC Treatments / Results  Labs (all labs ordered are listed, but only abnormal results are displayed) Labs Reviewed  RESP PANEL BY RT-PCR (FLU A&B, COVID) [id number]    EKG   Radiology No results found.  Procedures Procedures (including critical care time)  Medications Ordered in UC Medications - No data to display  Initial Impression / Assessment and Plan / UC Course  I have reviewed the triage vital signs and the nursing notes.  Pertinent labs & imaging results that were available during my care of the patient were reviewed by me and considered in my medical decision making (see chart for details).    Suspect viral etiology of symptoms.  Will screen for COVID and flu.  Encouraged Mucinex for congestion and recommended follow-up with any further concerns while awaiting results.  Final Clinical Impressions(s) / UC Diagnoses   Final diagnoses:  Acute upper respiratory infection  Encounter for screening for COVID-19   Discharge Instructions   None    ED Prescriptions   None    PDMP not reviewed this encounter.   Tomi Bamberger, PA-C 03/11/22 1057

## 2022-03-16 ENCOUNTER — Encounter: Payer: Self-pay | Admitting: Internal Medicine

## 2022-03-16 ENCOUNTER — Telehealth (INDEPENDENT_AMBULATORY_CARE_PROVIDER_SITE_OTHER): Payer: BC Managed Care – PPO | Admitting: Internal Medicine

## 2022-03-16 DIAGNOSIS — E1165 Type 2 diabetes mellitus with hyperglycemia: Secondary | ICD-10-CM | POA: Diagnosis not present

## 2022-03-16 DIAGNOSIS — U071 COVID-19: Secondary | ICD-10-CM

## 2022-03-16 DIAGNOSIS — N1831 Chronic kidney disease, stage 3a: Secondary | ICD-10-CM

## 2022-03-16 MED ORDER — NIRMATRELVIR/RITONAVIR (PAXLOVID)TABLET
3.0000 | ORAL_TABLET | Freq: Two times a day (BID) | ORAL | 0 refills | Status: AC
Start: 2022-03-16 — End: 2022-03-21

## 2022-03-16 MED ORDER — ALBUTEROL SULFATE HFA 108 (90 BASE) MCG/ACT IN AERS: 2.0000 | INHALATION_SPRAY | Freq: Four times a day (QID) | RESPIRATORY_TRACT | 1 refills | Status: DC | PRN

## 2022-03-16 MED ORDER — HYDROCODONE BIT-HOMATROP MBR 5-1.5 MG/5ML PO SOLN: 5.0000 mL | Freq: Four times a day (QID) | ORAL | 0 refills | Status: AC | PRN

## 2022-03-16 NOTE — Assessment & Plan Note (Signed)
Lab Results  Component Value Date   HGBA1C 7.2 (H) 09/15/2021   Stable, pt to continue current medical treatment fraxiga 10 mg qd, metformin ER 500 mg - 4 qam, prandin 0.5 bid, and ozempic 2 mg weekly

## 2022-03-16 NOTE — Assessment & Plan Note (Signed)
Lab Results  Component Value Date   CREATININE 0.98 09/15/2021   Stable overall, cont to avoid nephrotoxins  

## 2022-03-16 NOTE — Patient Instructions (Signed)
Please take all new medication as prescribed 

## 2022-03-16 NOTE — Assessment & Plan Note (Signed)
Mild to mod, for paxlovid course, cough med prn, inhaler prn, to f/u any worsening symptoms or concerns

## 2022-03-16 NOTE — Progress Notes (Signed)
Patient ID: Jessica Daniels, female   DOB: 1961-02-05, 61 y.o.   MRN: [id number]  Virtual Visit via Video Note  I connected with Eustace Moore Leak-Burns on 03/16/22 at  3:00 PM EDT by a video enabled telemedicine application and verified that I am speaking with the correct person using two identifiers.  Location of all participants today Patient: at home Provider: at office   I discussed the limitations of evaluation and management by telemedicine and the availability of in person appointments. The patient expressed understanding and agreed to proceed.  History of Present Illness: Here to f/u with c/o covid 1+ testing after symptoms began x 4 days with head congestion, cough, ST, mild sob, but taste and smell ok and no GI symptoms. Confirmed at [facility].   Pt denies chest pain, wheezing, orthopnea, PND, increased LE swelling, palpitations, dizziness or syncope.   Pt denies polydipsia, polyuria, or new focal neuro s/s.    Past Medical History:  Diagnosis Date   Allergic rhinitis    Anemia    Anxiety    Depression    Diabetes mellitus    Hyperlipidemia    Hypertension    Past Surgical History:  Procedure Laterality Date   CHOLECYSTECTOMY     LUMBAR LAMINECTOMY      reports that she has never smoked. She has never used smokeless tobacco. She reports that she does not drink alcohol and does not use drugs. family history includes Diabetes in an other family member; Heart attack in her mother; Lung cancer in her father. Allergies  Allergen Reactions   Crestor [Rosuvastatin]     Elevated liver enzymes   Current Outpatient Medications on File Prior to Visit  Medication Sig Dispense Refill   amLODipine (NORVASC) 10 MG tablet TAKE 1 TABLET(10 MG) BY MOUTH DAILY 90 tablet 1   aspirin EC 81 MG tablet Take 1 tablet (81 mg total) by mouth daily. 90 tablet 11   atorvastatin (LIPITOR) 20 MG tablet 1 tab by mouth once daily 90 tablet 3   benazepril (LOTENSIN) 40 MG tablet 1 Tablet by mouth daily. 90  tablet 3   Cholecalciferol (THERA-D 4000) 100 MCG (4000 UT) TABS 1 tab by mouth once daily 30 tablet 99   dapagliflozin propanediol (FARXIGA) 10 MG TABS tablet Take 1 tablet (10 mg total) by mouth daily before breakfast. 90 tablet 1   diclofenac (VOLTAREN) 75 MG EC tablet Take 75 mg by mouth 2 (two) times daily as needed.     fluticasone (FLONASE) 50 MCG/ACT nasal spray Place 2 sprays into both nostrils daily. 16 g 6   glucose blood (ONETOUCH VERIO) test strip Use to check blood sugars twice a day 100 each 11   loratadine (CLARITIN) 10 MG tablet Take 1 tablet (10 mg total) by mouth daily. 15 tablet 0   meloxicam (MOBIC) 15 MG tablet Take 1 tablet (15 mg total) by mouth daily. 90 tablet 1   metFORMIN (GLUCOPHAGE-XR) 500 MG 24 hr tablet Take 4 tablets (2,000 mg total) by mouth daily with breakfast. 360 tablet 3   methocarbamol (ROBAXIN) 500 MG tablet Take 1 tablet (500 mg total) by mouth every 8 (eight) hours as needed. 30 tablet 0   Multiple Vitamin (MULTI-VITAMIN) tablet Take 1 tablet by mouth daily.     ONETOUCH DELICA LANCETS 33G MISC Use to help check blood sugars twice a day 100 each 5   repaglinide (PRANDIN) 0.5 MG tablet Take 1 tablet (0.5 mg total) by mouth 2 (two) times daily  before a meal. 180 tablet 3   RESTASIS 0.05 % ophthalmic emulsion 1 drop 2 (two) times daily.     Semaglutide, 2 MG/DOSE, (OZEMPIC, 2 MG/DOSE,) 8 MG/3ML SOPN Inject 2 mg into the skin once a week. 9 mL 3   No current facility-administered medications on file prior to visit.    Observations/Objective: Alert, NAD, appropriate mood and affect, resps normal, cn 2-12 intact, moves all 4s, no visible rash or swelling Lab Results  Component Value Date   WBC 5.7 09/15/2021   HGB 12.7 09/15/2021   HCT 40.2 09/15/2021   PLT 380.0 09/15/2021   GLUCOSE 123 (H) 09/15/2021   CHOL 152 09/15/2021   TRIG 168.0 (H) 09/15/2021   HDL 50.10 09/15/2021   LDLDIRECT 137.4 10/20/2009   LDLCALC 68 09/15/2021   ALT 13 09/15/2021    AST 16 09/15/2021   NA 139 09/15/2021   K 3.4 (L) 09/15/2021   CL 101 09/15/2021   CREATININE 0.98 09/15/2021   BUN 9 09/15/2021   CO2 28 09/15/2021   TSH 1.30 09/15/2021   HGBA1C 7.2 (H) 09/15/2021   MICROALBUR 0.8 09/15/2021   Assessment and Plan: See notes  Follow Up Instructions: See notes   I discussed the assessment and treatment plan with the patient. The patient was provided an opportunity to ask questions and all were answered. The patient agreed with the plan and demonstrated an understanding of the instructions.   The patient was advised to call back or seek an in-person evaluation if the symptoms worsen or if the condition fails to improve as anticipated.  Oliver Barre, MD

## 2022-04-14 ENCOUNTER — Ambulatory Visit: Payer: BC Managed Care – PPO | Admitting: Internal Medicine

## 2022-04-14 ENCOUNTER — Encounter: Payer: Self-pay | Admitting: Internal Medicine

## 2022-04-14 VITALS — BP 132/76 | HR 73 | Temp 98.0°F | Ht 67.0 in | Wt 203.0 lb

## 2022-04-14 DIAGNOSIS — Z1211 Encounter for screening for malignant neoplasm of colon: Secondary | ICD-10-CM

## 2022-04-14 DIAGNOSIS — N1831 Chronic kidney disease, stage 3a: Secondary | ICD-10-CM

## 2022-04-14 DIAGNOSIS — E538 Deficiency of other specified B group vitamins: Secondary | ICD-10-CM

## 2022-04-14 DIAGNOSIS — E559 Vitamin D deficiency, unspecified: Secondary | ICD-10-CM

## 2022-04-14 DIAGNOSIS — E1165 Type 2 diabetes mellitus with hyperglycemia: Secondary | ICD-10-CM

## 2022-04-14 DIAGNOSIS — J029 Acute pharyngitis, unspecified: Secondary | ICD-10-CM

## 2022-04-14 DIAGNOSIS — J069 Acute upper respiratory infection, unspecified: Secondary | ICD-10-CM | POA: Insufficient documentation

## 2022-04-14 DIAGNOSIS — I10 Essential (primary) hypertension: Secondary | ICD-10-CM

## 2022-04-14 LAB — BASIC METABOLIC PANEL
BUN: 10 mg/dL (ref 6–23)
CO2: 28 mEq/L (ref 19–32)
Calcium: 9.8 mg/dL (ref 8.4–10.5)
Chloride: 101 mEq/L (ref 96–112)
Creatinine, Ser: 0.94 mg/dL (ref 0.40–1.20)
GFR: 66.42 mL/min (ref >60.00)
Glucose, Bld: 118 mg/dL — ABNORMAL HIGH (ref 70–99)
Potassium: 3.5 mEq/L (ref 3.5–5.1)
Sodium: 139 mEq/L (ref 135–145)

## 2022-04-14 LAB — HEPATIC FUNCTION PANEL
ALT: 13 U/L (ref 0–35)
AST: 16 U/L (ref 0–37)
Albumin: 4.3 g/dL (ref 3.5–5.2)
Alkaline Phosphatase: 127 U/L — ABNORMAL HIGH (ref 39–117)
Bilirubin, Direct: 0.1 mg/dL (ref 0.0–0.3)
Total Bilirubin: 0.4 mg/dL (ref 0.2–1.2)
Total Protein: 7.4 g/dL (ref 6.0–8.3)

## 2022-04-14 LAB — LIPID PANEL
Cholesterol: 131 mg/dL (ref 0–200)
HDL: 47.1 mg/dL (ref >39.00)
LDL Cholesterol: 61 mg/dL (ref 0–99)
NonHDL: 83.81
Total CHOL/HDL Ratio: 3
Triglycerides: 112 mg/dL (ref 0.0–149.0)
VLDL: 22.4 mg/dL (ref 0.0–40.0)

## 2022-04-14 LAB — POCT RAPID STREP A (OFFICE): Rapid Strep A Screen: NEGATIVE

## 2022-04-14 LAB — HEMOGLOBIN A1C: Hgb A1c MFr Bld: 6.8 % — ABNORMAL HIGH (ref 4.6–6.5)

## 2022-04-14 LAB — VITAMIN D 25 HYDROXY (VIT D DEFICIENCY, FRACTURES): VITD: 31.28 ng/mL (ref 30.00–100.00)

## 2022-04-14 MED ORDER — HYDROCODONE BIT-HOMATROP MBR 5-1.5 MG/5ML PO SOLN: 5.0000 mL | Freq: Four times a day (QID) | ORAL | 0 refills | Status: AC | PRN

## 2022-04-14 MED ORDER — AZITHROMYCIN 250 MG PO TABS: ORAL_TABLET | ORAL | 1 refills | Status: AC

## 2022-04-14 NOTE — Assessment & Plan Note (Signed)
Last vitamin D Lab Results  Component Value Date   VD25OH 31.77 09/15/2021   Low, to start oral replacement

## 2022-04-14 NOTE — Assessment & Plan Note (Signed)
Lab Results  Component Value Date   CREATININE 0.98 09/15/2021   Stable overall, cont to avoid nephrotoxins

## 2022-04-14 NOTE — Assessment & Plan Note (Addendum)
Lab Results  Component Value Date   HGBA1C 7.2 (H) 09/15/2021   Uncontrolled but with recent wt loss with ozempic, pt to continue current medical treatment farxiga 10 mg qd, and ozempic 2 mg weekly, metformin ER 500 - 4 per day, and prandin 0.5 mg bid, for f/u lab today

## 2022-04-14 NOTE — Progress Notes (Addendum)
Patient ID: [name], female   DOB: July 16, 1960, 61 y.o.   MRN: [id number]        Chief Complaint: follow up HTN, HLD and hyperglycemia and URI symptoms       HPI:  Jessica Daniels is a 61 y.o. female here with c/o acute onset today with severe ST, non prod cough, and ear fullness,  Pt denies chest pain, increased sob or doe, wheezing, orthopnea, PND, increased LE swelling, palpitations, dizziness or syncope.   Pt denies polydipsia, polyuria, or new focal neuro s/s.    Pt denies fever, night sweats, loss of appetite, or other constitutional symptoms , though has been able to lose wt with ozempic.         Wt Readings from Last 3 Encounters:  04/14/22 203 lb (92.1 kg)  10/12/21 204 lb (92.5 kg)  09/01/21 211 lb (95.7 kg)   BP Readings from Last 3 Encounters:  04/14/22 132/76  03/11/22 (!) 140/95  10/12/21 118/68         Past Medical History:  Diagnosis Date   Allergic rhinitis    Anemia    Anxiety    Depression    Diabetes mellitus    Hyperlipidemia    Hypertension    Past Surgical History:  Procedure Laterality Date   CHOLECYSTECTOMY     LUMBAR LAMINECTOMY      reports that she has never smoked. She has never used smokeless tobacco. She reports that she does not drink alcohol and does not use drugs. family history includes Diabetes in an other family member; Heart attack in her mother; Lung cancer in her father. Allergies  Allergen Reactions   Crestor [Rosuvastatin]     Elevated liver enzymes   Current Outpatient Medications on File Prior to Visit  Medication Sig Dispense Refill   albuterol (VENTOLIN HFA) 108 (90 Base) MCG/ACT inhaler Inhale 2 puffs into the lungs every 6 (six) hours as needed for wheezing or shortness of breath. 8 g 1   amLODipine (NORVASC) 10 MG tablet TAKE 1 TABLET(10 MG) BY MOUTH DAILY 90 tablet 1   aspirin EC 81 MG tablet Take 1 tablet (81 mg total) by mouth daily. 90 tablet 11   atorvastatin (LIPITOR) 20 MG tablet 1 tab by mouth once daily  90 tablet 3   benazepril (LOTENSIN) 40 MG tablet 1 Tablet by mouth daily. 90 tablet 3   Cholecalciferol (THERA-D 4000) 100 MCG (4000 UT) TABS 1 tab by mouth once daily 30 tablet 99   dapagliflozin propanediol (FARXIGA) 10 MG TABS tablet Take 1 tablet (10 mg total) by mouth daily before breakfast. 90 tablet 1   diclofenac (VOLTAREN) 75 MG EC tablet Take 75 mg by mouth 2 (two) times daily as needed.     fluticasone (FLONASE) 50 MCG/ACT nasal spray Place 2 sprays into both nostrils daily. 16 g 6   glucose blood (ONETOUCH VERIO) test strip Use to check blood sugars twice a day 100 each 11   loratadine (CLARITIN) 10 MG tablet Take 1 tablet (10 mg total) by mouth daily. 15 tablet 0   meloxicam (MOBIC) 15 MG tablet Take 1 tablet (15 mg total) by mouth daily. 90 tablet 1   metFORMIN (GLUCOPHAGE-XR) 500 MG 24 hr tablet Take 4 tablets (2,000 mg total) by mouth daily with breakfast. 360 tablet 3   methocarbamol (ROBAXIN) 500 MG tablet Take 1 tablet (500 mg total) by mouth every 8 (eight) hours as needed. 30 tablet 0   Multiple Vitamin (MULTI-VITAMIN)  tablet Take 1 tablet by mouth daily.     ONETOUCH DELICA LANCETS 33G MISC Use to help check blood sugars twice a day 100 each 5   repaglinide (PRANDIN) 0.5 MG tablet Take 1 tablet (0.5 mg total) by mouth 2 (two) times daily before a meal. 180 tablet 3   RESTASIS 0.05 % ophthalmic emulsion 1 drop 2 (two) times daily.     Semaglutide, 2 MG/DOSE, (OZEMPIC, 2 MG/DOSE,) 8 MG/3ML SOPN Inject 2 mg into the skin once a week. 9 mL 3   No current facility-administered medications on file prior to visit.        ROS:  All others reviewed and negative.  Objective        PE:  BP 132/76 (BP Location: Right Arm, Patient Position: Sitting, Cuff Size: Normal)   Pulse 73   Temp 98 F (36.7 C) (Oral)   Ht 5\' 7"  (1.702 m)   Wt 203 lb (92.1 kg)   LMP 08/06/2015   SpO2 98%   BMI 31.79 kg/m                 Constitutional: Pt appears in NAD               HENT: Head:  NCAT.                Right Ear: External ear normal.                 Left Ear: External ear normal.  Bilat tm's with mild erythema.  Max sinus areas mild tender.  Pharynx with mod erythema, no exudate               Eyes: . Pupils are equal, round, and reactive to light. Conjunctivae and EOM are normal               Nose: without d/c or deformity               Neck: Neck supple. Gross normal ROM               Cardiovascular: Normal rate and regular rhythm.                 Pulmonary/Chest: Effort normal and breath sounds without rales or wheezing.                               Neurological: Pt is alert. At baseline orientation, motor grossly intact               Skin: Skin is warm. No rashes, no other new lesions, LE edema - none               Psychiatric: Pt behavior is normal without agitation   Micro: none  Cardiac tracings I have personally interpreted today:  none  Pertinent Radiological findings (summarize): none   Lab Results  Component Value Date   WBC 5.7 09/15/2021   HGB 12.7 09/15/2021   HCT 40.2 09/15/2021   PLT 380.0 09/15/2021   GLUCOSE 123 (H) 09/15/2021   CHOL 152 09/15/2021   TRIG 168.0 (H) 09/15/2021   HDL 50.10 09/15/2021   LDLDIRECT 137.4 10/20/2009   LDLCALC 68 09/15/2021   ALT 13 09/15/2021   AST 16 09/15/2021   NA 139 09/15/2021   K 3.4 (L) 09/15/2021   CL 101 09/15/2021   CREATININE 0.98 09/15/2021   BUN 9 09/15/2021   CO2 28  09/15/2021   TSH 1.30 09/15/2021   HGBA1C 7.2 (H) 09/15/2021   MICROALBUR 0.8 09/15/2021   Rapid strep - neg Assessment/Plan:  Jessica Daniels is a 61 y.o. Black or African American [2] female with  has a past medical history of Allergic rhinitis, Anemia, Anxiety, Depression, Diabetes mellitus, Hyperlipidemia, and Hypertension.  Vitamin D deficiency Last vitamin D Lab Results  Component Value Date   VD25OH 31.77 09/15/2021   Low, to start oral replacement   Essential hypertension BP Readings from Last 3  Encounters:  04/14/22 132/76  03/11/22 (!) 140/95  10/12/21 118/68   Stable, pt to continue medical treatment norvasc 5 mg qd, lotensin 40 mg qd   Diabetes Lab Results  Component Value Date   HGBA1C 7.2 (H) 09/15/2021   Uncontrolled but with recent wt loss with ozempic, pt to continue current medical treatment farxiga 10 mg qd, and ozempic 2 mg weekly, metformin ER 500 - 4 per day, and prandin 0.5 mg bid, for f/u lab today  CKD (chronic kidney disease) stage 3, GFR 30-59 ml/min (HCC) Lab Results  Component Value Date   CREATININE 0.98 09/15/2021   Stable overall, cont to avoid nephrotoxins   Acute pharyngitis Mild to mod, for antibx course - zpack,  to f/u any worsening symptoms or concerns  Followup: Return in about 6 months (around 10/14/2022).  Cathlean Cower, MD 04/14/2022 12:04 PM [facility]

## 2022-04-14 NOTE — Assessment & Plan Note (Signed)
BP Readings from Last 3 Encounters:  04/14/22 132/76  03/11/22 (!) 140/95  10/12/21 118/68   Stable, pt to continue medical treatment norvasc 5 mg qd, lotensin 40 mg qd

## 2022-04-14 NOTE — Assessment & Plan Note (Signed)
Mild to mod, for antibx course zpack,  to f/u any worsening symptoms or concerns 

## 2022-04-14 NOTE — Patient Instructions (Signed)
Your Strep testing was done today  Please take all new medication as prescribed  - the antibiotic  Please continue all other medications as before, and refills have been done if requested.  Please have the pharmacy call with any other refills you may need.  Please continue your efforts at being more active, low cholesterol diet, and weight control.  Please keep your appointments with your specialists as you may have planned  Please go to the LAB at the blood drawing area for the tests to be done  You will be contacted by phone if any changes need to be made immediately.  Otherwise, you will receive a letter about your results with an explanation, but please check with MyChart first.  Please remember to sign up for MyChart if you have not done so, as this will be important to you in the future with finding out test results, communicating by private email, and scheduling acute appointments online when needed.  Please make an Appointment to return in 6 months, or sooner if needed, also with Lab Appointment for testing done 3-5 days before at the [facility] (so this is for TWO appointments - please see the scheduling desk as you leave)

## 2022-04-15 ENCOUNTER — Telehealth: Payer: Self-pay | Admitting: Internal Medicine

## 2022-04-15 NOTE — Telephone Encounter (Signed)
The liver enzymes were normal, except for the slight elevation in the alk phos, which is only partially a liver test, as it also comes from bone and brain .  So all by itself, it is not considered to be significant and does not require further eval or treatment

## 2022-04-15 NOTE — Telephone Encounter (Signed)
Patient would like an explanation of high enzymes, please advise.

## 2022-04-15 NOTE — Telephone Encounter (Signed)
Patient returned call about message below:  The test results show that your current treatment is OK, as the tests are stable    There is no other need for change of treatment or further evaluation based on these results, at this time.  thanks   She is aware but did ask for a call back about her liver enzymes at [phone]

## 2022-04-19 NOTE — Telephone Encounter (Signed)
Communicated response via mychart.

## 2022-04-20 IMAGING — MR MR LUMBAR SPINE W/O CM
4 of 5 series · 25 of 48 positions shown · non-contrast
Comparison: Lumbar spine MRI 05/10/2019

CLINICAL DATA: Lower back pain radiating to the legs

EXAM:
MRI LUMBAR SPINE WITHOUT CONTRAST
TECHNIQUE: Multiplanar, multisequence MR imaging of the lumbar spine was
performed. No intravenous contrast was administered.

[Series 3: T2 · sagittal · 4.0mm · 0.53mm/px · 4 of 14 slices shown (1 of 2)]
[im 1/14]
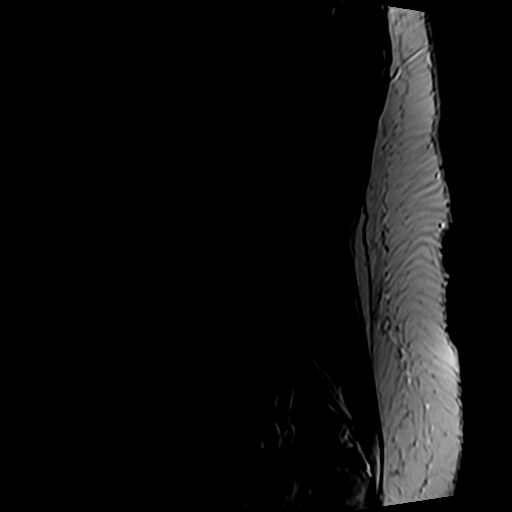
[im 5/14]
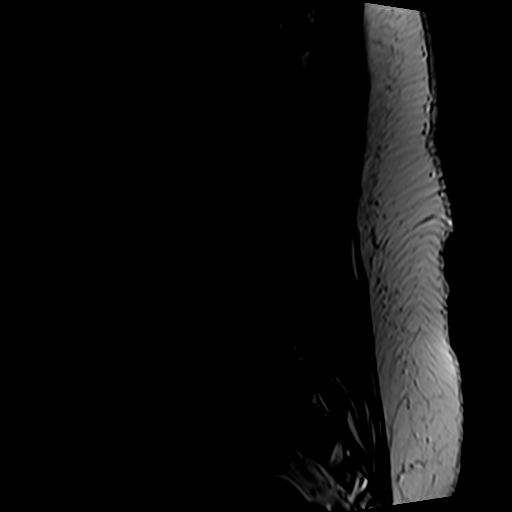
[im 9/14]
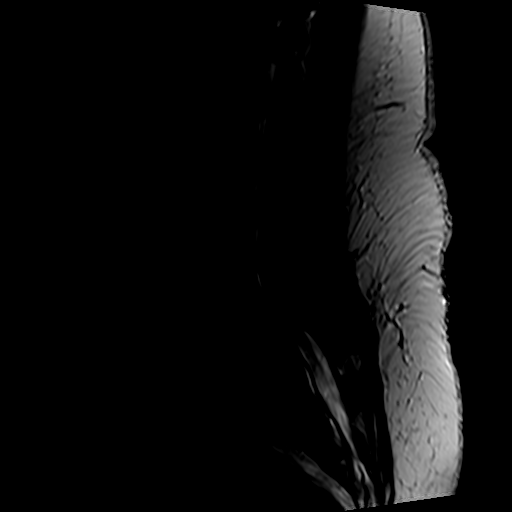
[im 14/14]
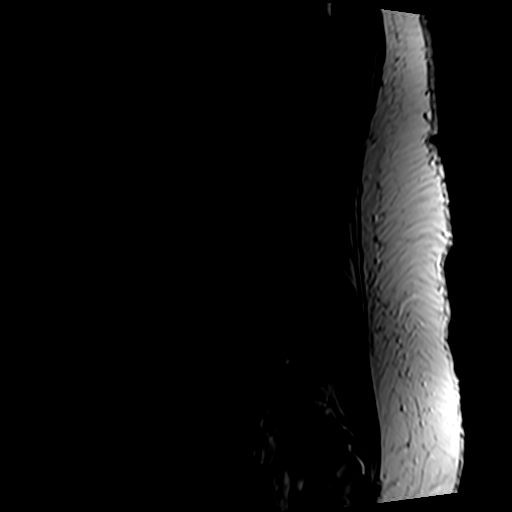

[Series 5: T1 · sagittal · 4.0mm · 0.53mm/px · 6 of 15 slices shown (1 of 2)]
[im 1/15]
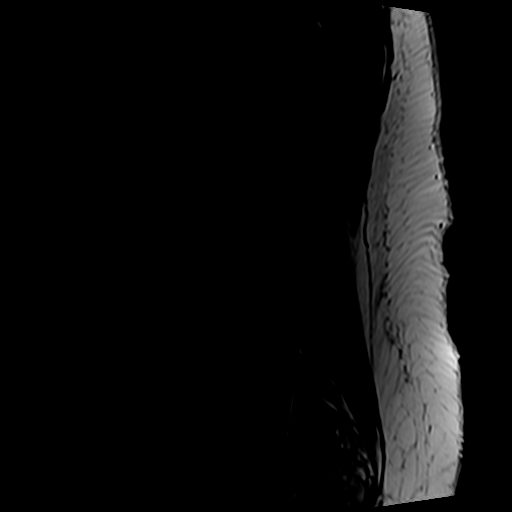
[im 3/15]
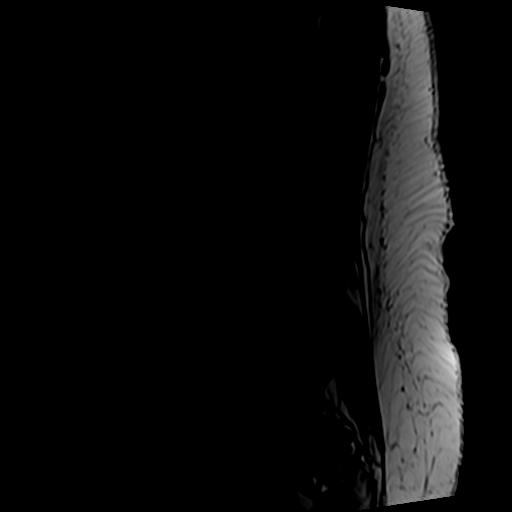
[im 6/15]
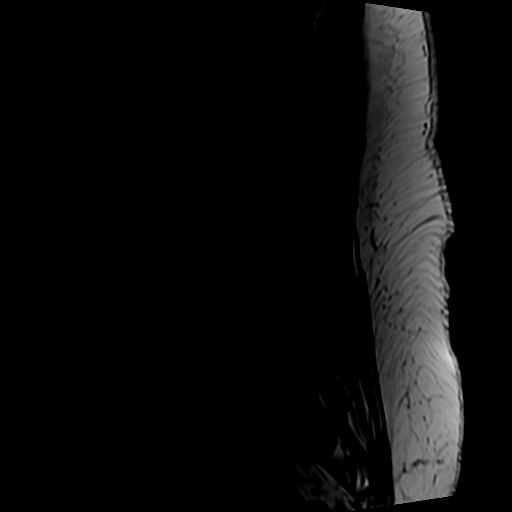
[im 9/15]
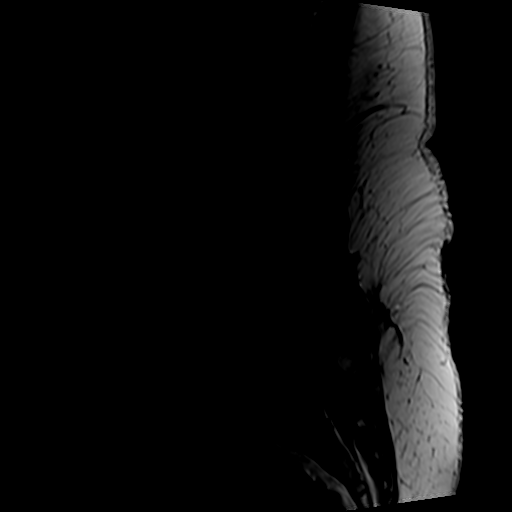
[im 12/15]
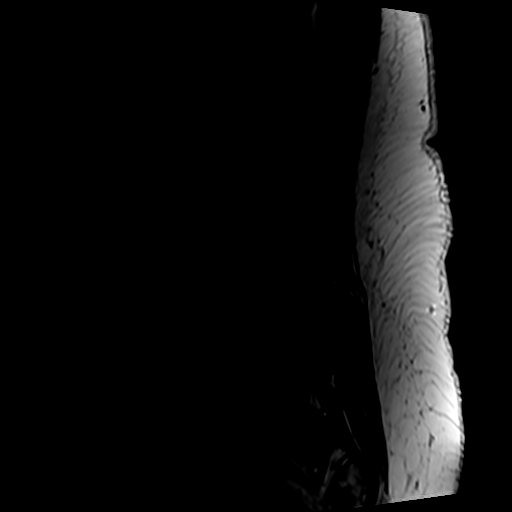
[im 15/15]
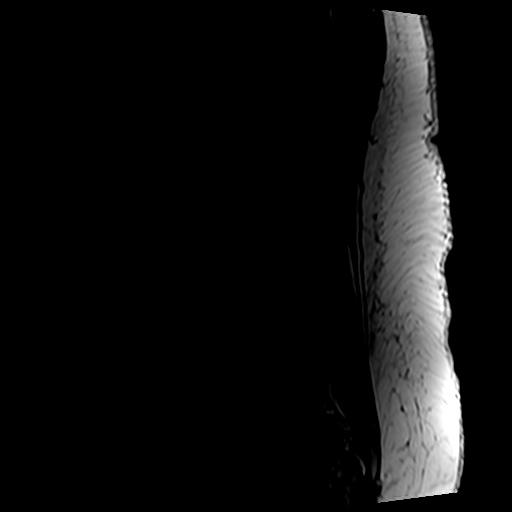

[Series 6: T2 · axial · 4.0mm · 0.70mm/px · z∈[-92,+116]mm · 10 of 41 slices shown (2 of 2)]
[im 3/41]
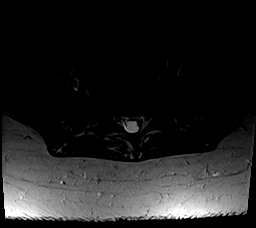
[im 6/41]
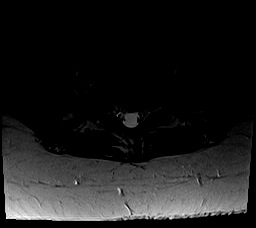
[im 9/41]
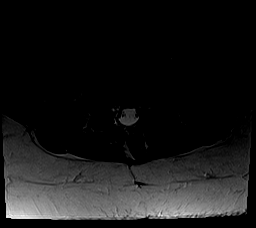
[im 14/41]
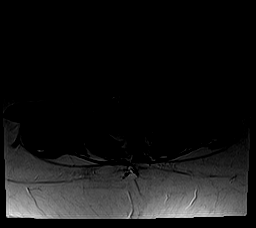
[im 19/41]
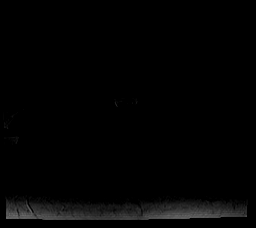
[im 22/41]
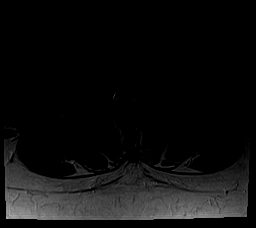
[im 25/41]
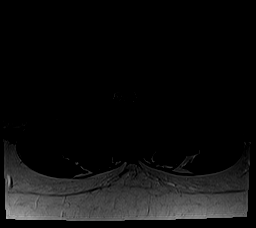
[im 30/41]
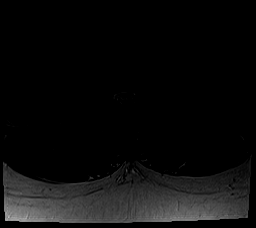
[im 35/41]
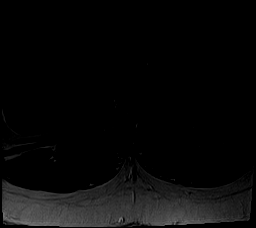
[im 41/41]
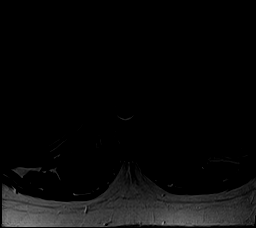

[Series 7: T1 · axial · 4.0mm · 0.35mm/px · z∈[-92,+85]mm · 5 of 41 slices shown (2 of 2)]
[im 3/41]
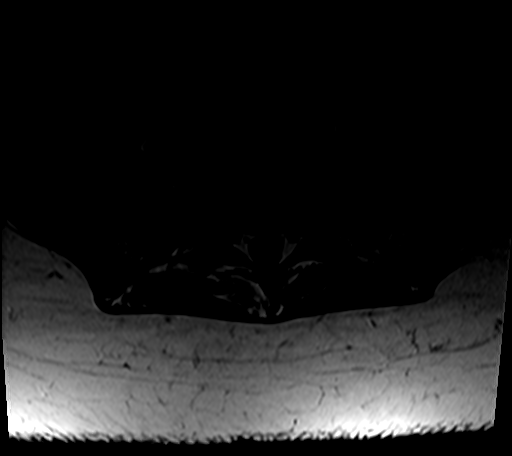
[im 6/41]
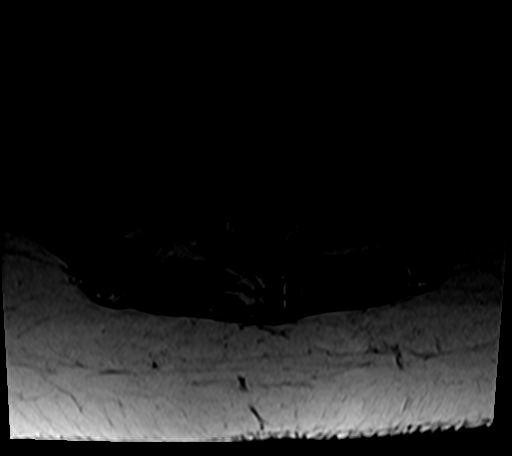
[im 9/41]
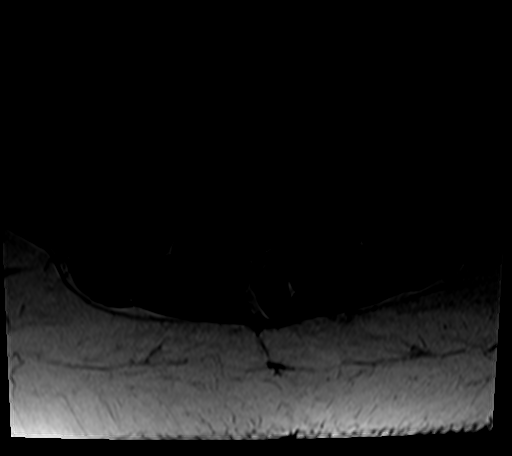
[im 22/41]
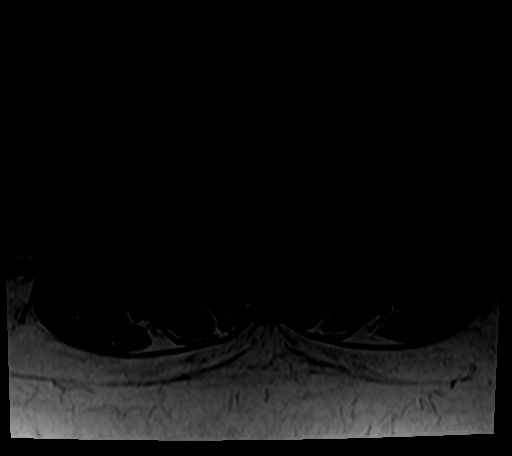
[im 35/41]
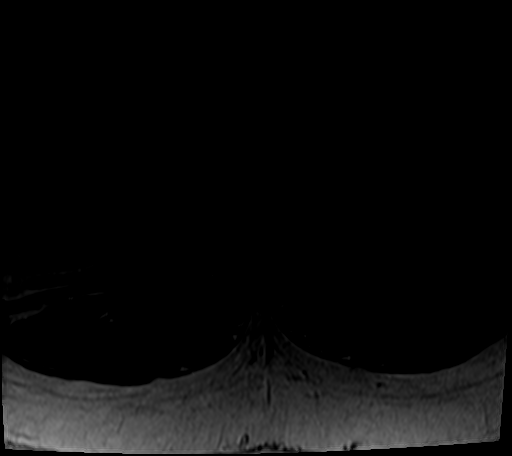

[25 of 48 positions shown; findings below may reference images not displayed]

FINDINGS: Segmentation: There is transitional anatomy with a rudimentary disc
at S1-S2. For the purpose of this report, the lowest disc space is
designated S1-S2. This is in keeping with the convention on the
prior lumbar spine MRI.

Alignment: There is 4 mm grade 1 anterolisthesis of L4 on L5,
minimally increased since 4242. Alignment at the remaining levels is
normal.

Vertebrae: There is Modic type 2 degenerative endplate change at
L5-S1, similar to the prior study. There is mild STIR signal
abnormality along the left aspect of the L4-L5 disc space, favored
degenerative. There is no suspicious marrow signal abnormality.
Vertebral body heights are preserved.

Conus medullaris and cauda equina: Conus extends to the L1-L2 level.
Conus and cauda equina appear normal.

Paraspinal and other soft tissues: Negative.

Disc levels:

There is multilevel facet arthropathy, most advanced at L4-L5. There
is perifacet soft tissue edema bilaterally at L3-L4 and L4-L5 with a
prominent left facet joint effusion at L4-L5.

T12-L1: No significant spinal canal or neural foraminal stenosis.

L1-L2: No significant spinal canal or neural foraminal stenosis.

L2-L3: No significant spinal canal or neural foraminal stenosis.

L3-L4: There is a mild disc bulge, mild ligamentum flavum
thickening, and bilateral facet arthropathy without significant
spinal canal or neural foraminal stenosis.

L4-L5: There is uncovering of the disc posteriorly with a mild
superimposed bulge with slight superior migration centrally and
bilateral foraminal components with a left foraminal annular
fissure. There is ligamentum flavum thickening and bulky bilateral
facet arthropathy. Findings result in mild-to-moderate spinal canal
stenosis with crowding of the subarticular zones and moderate to
severe left and mild right neural foraminal stenosis with contact of
the exiting left L4 nerve root. The foraminal stenosis on the left
has worsened since 4242.

L5-S1: The patient is status post right laminectomy at this level.
There is marked intervertebral disc space narrowing with associated
degenerative endplate change and bilateral facet arthropathy
resulting in mild bilateral neural foraminal stenosis without
significant spinal canal stenosis.

The above findings are unchanged, with the exception of the above
described left foraminal stenosis at L4-L5.
IMPRESSION: 1. Transitional anatomy as above. The numbering convention used on
this report is the same as the prior MRI from [DATE]. Multilevel degenerative changes of the lumbar spine detailed
above, most advanced at L4-L5 where there is grade 1 anterolisthesis
and superimposed degenerative changes resulting in mild-to-moderate
spinal canal stenosis and moderate to severe left neural foraminal
stenosis with contact of the exiting L4 nerve root. Findings at this
level have progressed since [DATE]. Multilevel facet arthropathy, most advanced at L4-L5 with
associated perifacetal soft tissue edema and a prominent left
effusion.
4. Marked intervertebral disc space narrowing with associated
degenerative endplate change at L5-S1, overall similar to the prior
study.

## 2022-05-10 LAB — COLOGUARD: COLOGUARD: NEGATIVE

## 2022-07-14 ENCOUNTER — Encounter: Payer: Self-pay | Admitting: Internal Medicine

## 2022-07-14 ENCOUNTER — Ambulatory Visit: Payer: BC Managed Care – PPO | Admitting: Internal Medicine

## 2022-07-14 ENCOUNTER — Ambulatory Visit (INDEPENDENT_AMBULATORY_CARE_PROVIDER_SITE_OTHER): Payer: BC Managed Care – PPO

## 2022-07-14 VITALS — BP 130/82 | HR 73 | Temp 98.0°F | Ht 67.0 in | Wt 199.0 lb

## 2022-07-14 DIAGNOSIS — E559 Vitamin D deficiency, unspecified: Secondary | ICD-10-CM

## 2022-07-14 DIAGNOSIS — R079 Chest pain, unspecified: Secondary | ICD-10-CM

## 2022-07-14 DIAGNOSIS — E538 Deficiency of other specified B group vitamins: Secondary | ICD-10-CM

## 2022-07-14 DIAGNOSIS — E7800 Pure hypercholesterolemia, unspecified: Secondary | ICD-10-CM

## 2022-07-14 DIAGNOSIS — Z0001 Encounter for general adult medical examination with abnormal findings: Secondary | ICD-10-CM

## 2022-07-14 DIAGNOSIS — E1165 Type 2 diabetes mellitus with hyperglycemia: Secondary | ICD-10-CM | POA: Diagnosis not present

## 2022-07-14 DIAGNOSIS — I10 Essential (primary) hypertension: Secondary | ICD-10-CM

## 2022-07-14 MED ORDER — OZEMPIC (2 MG/DOSE) 8 MG/3ML ~~LOC~~ SOPN: 2.0000 mg | PEN_INJECTOR | SUBCUTANEOUS | 3 refills | Status: DC

## 2022-07-14 NOTE — Assessment & Plan Note (Signed)
Lab Results  Component Value Date   LDLCALC 61 04/14/2022   Stable, pt to continue current statin lipitor 20 mg qd

## 2022-07-14 NOTE — Assessment & Plan Note (Signed)
Age and sex appropriate education and counseling updated with regular exercise and diet Referrals for preventative services - none needed Immunizations addressed - for tdap and shingrix at pharmacy, declines covid bosoter Smoking counseling  - none needed Evidence for depression or other mood disorder - none significant Most recent labs reviewed. I have personally reviewed and have noted: 1) the patient's medical and social history 2) The patient's current medications and supplements 3) The patient's height, weight, and BMI have been recorded in the chart

## 2022-07-14 NOTE — Assessment & Plan Note (Signed)
Lab Results  Component Value Date   HGBA1C 6.8 (H) 04/14/2022   Stable, pt to continue current medical treatment farxiga 10 qd, metformin ER 500 mg - 4 qd, prandin 0.5 bid, ozempic 2 mg weekly

## 2022-07-14 NOTE — Patient Instructions (Addendum)
Please have your Shingrix (shingles) shots done at your local pharmacy.  Your EKG was done today  Please continue all other medications as before, and refills have been done if requested.  Please have the pharmacy call with any other refills you may need.  Please continue your efforts at being more active, low cholesterol diet, and weight control.  You are otherwise up to date with prevention measures today.  Please keep your appointments with your specialists as you may have planned  You will be contacted regarding the referral for: stress test  Please go to the XRAY Department in the first floor for the x-ray testing  Please go to the LAB at the blood drawing area for the tests to be done  You will be contacted by phone if any changes need to be made immediately.  Otherwise, you will receive a letter about your results with an explanation, but please check with MyChart first.  Please remember to sign up for MyChart if you have not done so, as this will be important to you in the future with finding out test results, communicating by private email, and scheduling acute appointments online when needed.  Please make an Appointment to return in April 2024 as planned, or sooner if needed

## 2022-07-14 NOTE — Assessment & Plan Note (Signed)
Atpyical, ECG reviewed, for cXR, cardiac Stress testing, cont same med tx

## 2022-07-14 NOTE — Assessment & Plan Note (Signed)
Last vitamin D Lab Results  Component Value Date   VD25OH 31.28 04/14/2022   Low, to start oral replacement

## 2022-07-14 NOTE — Progress Notes (Signed)
Patient ID: Jessica Daniels, female   DOB: December 13, 1960, 62 y.o.   MRN: [id number]         Chief Complaint:: wellness exam and bp issues , chest pain, dm, hld, htn       HPI:  Jessica Daniels is a 62 y.o. female here for wellness exam; for tdap and covid booster, shingrix at pharmacy, o/w up to date                        Also Pt also c/o 1 wk recurring vague intermittent mild anterior chest pains, twitching and fluttery, dull, without diaphoresis, and denies increased sob or doe, wheezing, orthopnea, PND, increased LE swelling, palpitations, dizziness or syncope.   Pt denies polydipsia, polyuria, or new focal neuro s/s.    Pt denies fever, wt loss, night sweats, loss of appetite, or other constitutional symptoms    Wt Readings from Last 3 Encounters:  07/14/22 199 lb (90.3 kg)  04/14/22 203 lb (92.1 kg)  10/12/21 204 lb (92.5 kg)   BP Readings from Last 3 Encounters:  07/14/22 130/82  04/14/22 132/76  03/11/22 (!) 140/95   Immunization History  Administered Date(s) Administered   Moderna Sars-Covid-2 Vaccination 09/02/2019, 10/01/2019, 05/05/2020   Health Maintenance Due  Topic Date Due   DTaP/Tdap/Td (1 - Tdap) Never done      Past Medical History:  Diagnosis Date   Allergic rhinitis    Anemia    Anxiety    Depression    Diabetes mellitus    Hyperlipidemia    Hypertension    Past Surgical History:  Procedure Laterality Date   CHOLECYSTECTOMY     LUMBAR LAMINECTOMY      reports that she has never smoked. She has never used smokeless tobacco. She reports that she does not drink alcohol and does not use drugs. family history includes Diabetes in an other family member; Heart attack in her mother; Lung cancer in her father. Allergies  Allergen Reactions   Crestor [Rosuvastatin]     Elevated liver enzymes   Current Outpatient Medications on File Prior to Visit  Medication Sig Dispense Refill   albuterol (VENTOLIN HFA) 108 (90 Base) MCG/ACT inhaler Inhale 2 puffs  into the lungs every 6 (six) hours as needed for wheezing or shortness of breath. 8 g 1   amLODipine (NORVASC) 10 MG tablet TAKE 1 TABLET(10 MG) BY MOUTH DAILY 90 tablet 1   aspirin EC 81 MG tablet Take 1 tablet (81 mg total) by mouth daily. 90 tablet 11   atorvastatin (LIPITOR) 20 MG tablet 1 tab by mouth once daily 90 tablet 3   benazepril (LOTENSIN) 40 MG tablet 1 Tablet by mouth daily. 90 tablet 3   Cholecalciferol (THERA-D 4000) 100 MCG (4000 UT) TABS 1 tab by mouth once daily 30 tablet 99   dapagliflozin propanediol (FARXIGA) 10 MG TABS tablet Take 1 tablet (10 mg total) by mouth daily before breakfast. 90 tablet 1   diclofenac (VOLTAREN) 75 MG EC tablet Take 75 mg by mouth 2 (two) times daily as needed.     fluticasone (FLONASE) 50 MCG/ACT nasal spray Place 2 sprays into both nostrils daily. 16 g 6   glucose blood (ONETOUCH VERIO) test strip Use to check blood sugars twice a day 100 each 11   loratadine (CLARITIN) 10 MG tablet Take 1 tablet (10 mg total) by mouth daily. 15 tablet 0   meloxicam (MOBIC) 15 MG tablet Take 1 tablet (15  mg total) by mouth daily. 90 tablet 1   metFORMIN (GLUCOPHAGE-XR) 500 MG 24 hr tablet Take 4 tablets (2,000 mg total) by mouth daily with breakfast. 360 tablet 3   methocarbamol (ROBAXIN) 500 MG tablet Take 1 tablet (500 mg total) by mouth every 8 (eight) hours as needed. 30 tablet 0   Multiple Vitamin (MULTI-VITAMIN) tablet Take 1 tablet by mouth daily.     ONETOUCH DELICA LANCETS 01U MISC Use to help check blood sugars twice a day 100 each 5   repaglinide (PRANDIN) 0.5 MG tablet Take 1 tablet (0.5 mg total) by mouth 2 (two) times daily before a meal. 180 tablet 3   RESTASIS 0.05 % ophthalmic emulsion 1 drop 2 (two) times daily.     glipiZIDE (GLUCOTROL) 5 MG tablet Take 1 tablet by mouth daily.     No current facility-administered medications on file prior to visit.        ROS:  All others reviewed and negative.  Objective        PE:  BP 130/82 (BP  Location: Left Arm, Patient Position: Sitting, Cuff Size: Large)   Pulse 73   Temp 98 F (36.7 C) (Oral)   Ht 5\' 7"  (1.702 m)   Wt 199 lb (90.3 kg)   LMP 08/06/2015   SpO2 99%   BMI 31.17 kg/m                 Constitutional: Pt appears in NAD               HENT: Head: NCAT.                Right Ear: External ear normal.                 Left Ear: External ear normal.                Eyes: . Pupils are equal, round, and reactive to light. Conjunctivae and EOM are normal               Nose: without d/c or deformity               Neck: Neck supple. Gross normal ROM               Cardiovascular: Normal rate and regular rhythm.                 Pulmonary/Chest: Effort normal and breath sounds without rales or wheezing.                Abd:  Soft, NT, ND, + BS, no organomegaly               Neurological: Pt is alert. At baseline orientation, motor grossly intact               Skin: Skin is warm. No rashes, no other new lesions, LE edema - none               Psychiatric: Pt behavior is normal without agitation   Micro: none  Cardiac tracings I have personally interpreted today:  ECG - NSR 80 - no ischemic changes  Pertinent Radiological findings (summarize): none   Lab Results  Component Value Date   WBC 5.7 09/15/2021   HGB 12.7 09/15/2021   HCT 40.2 09/15/2021   PLT 380.0 09/15/2021   GLUCOSE 118 (H) 04/14/2022   CHOL 131 04/14/2022   TRIG 112.0 04/14/2022   HDL 47.10 04/14/2022  LDLDIRECT 137.4 10/20/2009   LDLCALC 61 04/14/2022   ALT 13 04/14/2022   AST 16 04/14/2022   NA 139 04/14/2022   K 3.5 04/14/2022   CL 101 04/14/2022   CREATININE 0.94 04/14/2022   BUN 10 04/14/2022   CO2 28 04/14/2022   TSH 1.30 09/15/2021   HGBA1C 6.8 (H) 04/14/2022   MICROALBUR 0.8 09/15/2021   Assessment/Plan:  Jessica Daniels is a 62 y.o. Black or African American [2] female with  has a past medical history of Allergic rhinitis, Anemia, Anxiety, Depression, Diabetes mellitus,  Hyperlipidemia, and Hypertension.  Encounter for well adult exam with abnormal findings Age and sex appropriate education and counseling updated with regular exercise and diet Referrals for preventative services - none needed Immunizations addressed - for tdap and shingrix at pharmacy, declines covid bosoter Smoking counseling  - none needed Evidence for depression or other mood disorder - none significant Most recent labs reviewed. I have personally reviewed and have noted: 1) the patient's medical and social history 2) The patient's current medications and supplements 3) The patient's height, weight, and BMI have been recorded in the chart   Diabetes Lab Results  Component Value Date   HGBA1C 6.8 (H) 04/14/2022   Stable, pt to continue current medical treatment farxiga 10 qd, metformin ER 500 mg - 4 qd, prandin 0.5 bid, ozempic 2 mg weekly   Essential hypertension BP Readings from Last 3 Encounters:  07/14/22 130/82  04/14/22 132/76  03/11/22 (!) 140/95   Stable, pt to continue medical treatment norvasc 10 qd, lotensin 40 gm qd   Hyperlipidemia Lab Results  Component Value Date   LDLCALC 61 04/14/2022   Stable, pt to continue current statin lipitor 20 mg qd   Vitamin D deficiency Last vitamin D Lab Results  Component Value Date   VD25OH 31.28 04/14/2022   Low, to start oral replacement   Chest pain Atpyical, ECG reviewed, for cXR, cardiac Stress testing, cont same med tx  Followup: Return if symptoms worsen or fail to improve.  Oliver Barre, MD 07/14/2022 8:39 PM [facility]

## 2022-07-14 NOTE — Assessment & Plan Note (Signed)
BP Readings from Last 3 Encounters:  07/14/22 130/82  04/14/22 132/76  03/11/22 (!) 140/95   Stable, pt to continue medical treatment norvasc 10 qd, lotensin 40 gm qd

## 2022-07-15 LAB — CBC WITH DIFFERENTIAL/PLATELET
Basophils Absolute: 0 10*3/uL (ref 0.0–0.1)
Basophils Relative: 0.5 % (ref 0.0–3.0)
Eosinophils Absolute: 0.1 10*3/uL (ref 0.0–0.7)
Eosinophils Relative: 1.8 % (ref 0.0–5.0)
HCT: 39.9 % (ref 36.0–46.0)
Hemoglobin: 12.4 g/dL (ref 12.0–15.0)
Lymphocytes Relative: 37 % (ref 12.0–46.0)
Lymphs Abs: 2.2 10*3/uL (ref 0.7–4.0)
MCHC: 31.1 g/dL (ref 30.0–36.0)
MCV: 70.2 fl — ABNORMAL LOW (ref 78.0–100.0)
Monocytes Absolute: 0.5 10*3/uL (ref 0.1–1.0)
Monocytes Relative: 8.8 % (ref 3.0–12.0)
Neutro Abs: 3.1 10*3/uL (ref 1.4–7.7)
Neutrophils Relative %: 51.9 % (ref 43.0–77.0)
Platelets: 372 10*3/uL (ref 150.0–400.0)
RBC: 5.68 Mil/uL — ABNORMAL HIGH (ref 3.87–5.11)
RDW: 15.8 % — ABNORMAL HIGH (ref 11.5–15.5)
WBC: 6 10*3/uL (ref 4.0–10.5)

## 2022-07-15 LAB — BASIC METABOLIC PANEL
BUN: 8 mg/dL (ref 6–23)
CO2: 31 mEq/L (ref 19–32)
Calcium: 10.1 mg/dL (ref 8.4–10.5)
Chloride: 103 mEq/L (ref 96–112)
Creatinine, Ser: 0.94 mg/dL (ref 0.40–1.20)
GFR: 66.3 mL/min (ref >60.00)
Glucose, Bld: 75 mg/dL (ref 70–99)
Potassium: 4 mEq/L (ref 3.5–5.1)
Sodium: 143 mEq/L (ref 135–145)

## 2022-07-15 LAB — HEPATIC FUNCTION PANEL
ALT: 13 U/L (ref 0–35)
AST: 18 U/L (ref 0–37)
Albumin: 4.8 g/dL (ref 3.5–5.2)
Alkaline Phosphatase: 105 U/L (ref 39–117)
Bilirubin, Direct: 0.1 mg/dL (ref 0.0–0.3)
Total Bilirubin: 0.5 mg/dL (ref 0.2–1.2)
Total Protein: 7.5 g/dL (ref 6.0–8.3)

## 2022-07-15 LAB — URINALYSIS, ROUTINE W REFLEX MICROSCOPIC
Bilirubin Urine: NEGATIVE
Hgb urine dipstick: NEGATIVE
Ketones, ur: NEGATIVE
Leukocytes,Ua: NEGATIVE
Nitrite: NEGATIVE
Specific Gravity, Urine: 1.015 (ref 1.000–1.030)
Total Protein, Urine: NEGATIVE
Urine Glucose: >=1000 — AB
Urobilinogen, UA: 0.2 (ref 0.0–1.0)
pH: 6 (ref 5.0–8.0)

## 2022-07-15 LAB — LIPID PANEL
Cholesterol: 130 mg/dL (ref 0–200)
HDL: 57.9 mg/dL (ref >39.00)
LDL Cholesterol: 49 mg/dL (ref 0–99)
NonHDL: 71.83
Total CHOL/HDL Ratio: 2
Triglycerides: 114 mg/dL (ref 0.0–149.0)
VLDL: 22.8 mg/dL (ref 0.0–40.0)

## 2022-07-15 LAB — TSH: TSH: 1.28 u[IU]/mL (ref 0.35–5.50)

## 2022-07-15 LAB — VITAMIN B12: Vitamin B-12: 220 pg/mL (ref 211–911)

## 2022-07-15 LAB — MICROALBUMIN / CREATININE URINE RATIO
Creatinine,U: 62.1 mg/dL
Microalb Creat Ratio: 1.1 mg/g (ref 0.0–30.0)
Microalb, Ur: <0.7 mg/dL (ref 0.0–1.9)

## 2022-07-15 LAB — VITAMIN D 25 HYDROXY (VIT D DEFICIENCY, FRACTURES): VITD: 26.42 ng/mL — ABNORMAL LOW (ref 30.00–100.00)

## 2022-07-15 LAB — HEMOGLOBIN A1C: Hgb A1c MFr Bld: 6.5 % (ref 4.6–6.5)

## 2022-07-27 ENCOUNTER — Encounter (HOSPITAL_COMMUNITY): Payer: Self-pay | Admitting: Internal Medicine

## 2022-08-06 ENCOUNTER — Telehealth (HOSPITAL_COMMUNITY): Payer: Self-pay | Admitting: *Deleted

## 2022-08-06 NOTE — Telephone Encounter (Signed)
Patient given detailed instructions per Myocardial Perfusion Study Information Sheet for the test on 08/06/2022 at 8:15. Patient notified to arrive 15 minutes early and that it is imperative to arrive on time for appointment to keep from having the test rescheduled.  If you need to cancel or reschedule your appointment, please call the office within 24 hours of your appointment. . Patient verbalized understanding.Jessica Daniels

## 2022-08-09 ENCOUNTER — Ambulatory Visit (HOSPITAL_COMMUNITY): Payer: BC Managed Care – PPO | Attending: Internal Medicine

## 2022-08-09 DIAGNOSIS — R079 Chest pain, unspecified: Secondary | ICD-10-CM | POA: Insufficient documentation

## 2022-08-09 LAB — MYOCARDIAL PERFUSION IMAGING
Angina Index: 0
Duke Treadmill Score: 6
Estimated workload: 7
Exercise duration (min): 6 min
LV dias vol: 59 mL (ref 46–106)
LV sys vol: 23 mL
MPHR: 161 {beats}/min
Nuc Stress EF: 62 %
Peak HR: 148 {beats}/min
Percent HR: 91 %
Rest HR: 65 {beats}/min
Rest Nuclear Isotope Dose: 10.1 mCi
SDS: 0
SRS: 0
SSS: 0
ST Depression (mm): 0 mm
Stress Nuclear Isotope Dose: 32.9 mCi
TID: 1.03

## 2022-08-09 MED ORDER — TECHNETIUM TC 99M TETROFOSMIN IV KIT
32.9000 | PACK | Freq: Once | INTRAVENOUS | Status: AC | PRN
  Administered 2022-08-09: 32.9 via INTRAVENOUS

## 2022-08-09 MED ORDER — TECHNETIUM TC 99M TETROFOSMIN IV KIT
10.1000 | PACK | Freq: Once | INTRAVENOUS | Status: AC | PRN
  Administered 2022-08-09: 10.1 via INTRAVENOUS

## 2022-08-30 ENCOUNTER — Other Ambulatory Visit: Payer: Self-pay

## 2022-08-30 MED ORDER — AMLODIPINE BESYLATE 10 MG PO TABS: ORAL_TABLET | ORAL | 1 refills | Status: DC

## 2022-09-02 ENCOUNTER — Encounter: Payer: BC Managed Care – PPO | Admitting: Nurse Practitioner

## 2022-09-02 NOTE — Progress Notes (Signed)
Patient requested to cancel appointment today. Reports her symptoms are much better and that she read her test results incorrectly, she is negative for COVID. Visit not completed today per patient request.

## 2022-09-07 ENCOUNTER — Telehealth: Payer: Self-pay | Admitting: Internal Medicine

## 2022-09-07 MED ORDER — DAPAGLIFLOZIN PROPANEDIOL 10 MG PO TABS: 10.0000 mg | ORAL_TABLET | Freq: Every day | ORAL | 3 refills | Status: DC

## 2022-09-07 NOTE — Telephone Encounter (Signed)
Ok done erx 

## 2022-09-07 NOTE — Telephone Encounter (Signed)
See below

## 2022-09-07 NOTE — Telephone Encounter (Signed)
Patient called requesting a refill on her medication dapagliflozin propanediol (FARXIGA) 10 MG TABS tablet. Patient is requesting this medication be sent to her pharmacy on file [place], [place] - [address] AT [place].   Best callback number is [phone].

## 2022-09-10 ENCOUNTER — Telehealth: Payer: Self-pay | Admitting: Internal Medicine

## 2022-09-10 DIAGNOSIS — E1165 Type 2 diabetes mellitus with hyperglycemia: Secondary | ICD-10-CM

## 2022-09-10 DIAGNOSIS — E559 Vitamin D deficiency, unspecified: Secondary | ICD-10-CM

## 2022-09-10 NOTE — Telephone Encounter (Signed)
6 month f/u appt 4/11, lab appt 4/11.  Please enter lab orders.

## 2022-09-10 NOTE — Telephone Encounter (Signed)
Ok done

## 2022-09-15 ENCOUNTER — Other Ambulatory Visit: Payer: Self-pay

## 2022-09-15 DIAGNOSIS — I10 Essential (primary) hypertension: Secondary | ICD-10-CM

## 2022-09-15 MED ORDER — BENAZEPRIL HCL 40 MG PO TABS: ORAL_TABLET | ORAL | 3 refills | Status: DC

## 2022-10-06 ENCOUNTER — Other Ambulatory Visit (INDEPENDENT_AMBULATORY_CARE_PROVIDER_SITE_OTHER): Payer: BC Managed Care – PPO

## 2022-10-06 DIAGNOSIS — E1165 Type 2 diabetes mellitus with hyperglycemia: Secondary | ICD-10-CM | POA: Diagnosis not present

## 2022-10-06 DIAGNOSIS — E559 Vitamin D deficiency, unspecified: Secondary | ICD-10-CM | POA: Diagnosis not present

## 2022-10-06 LAB — BASIC METABOLIC PANEL
BUN: 12 mg/dL (ref 6–23)
CO2: 27 mEq/L (ref 19–32)
Calcium: 10.2 mg/dL (ref 8.4–10.5)
Chloride: 104 mEq/L (ref 96–112)
Creatinine, Ser: 0.9 mg/dL (ref 0.40–1.20)
GFR: 69.74 mL/min (ref >60.00)
Glucose, Bld: 92 mg/dL (ref 70–99)
Potassium: 3.9 mEq/L (ref 3.5–5.1)
Sodium: 140 mEq/L (ref 135–145)

## 2022-10-06 LAB — LIPID PANEL
Cholesterol: 169 mg/dL (ref 0–200)
HDL: 62.2 mg/dL (ref >39.00)
LDL Cholesterol: 84 mg/dL (ref 0–99)
NonHDL: 107.22
Total CHOL/HDL Ratio: 3
Triglycerides: 116 mg/dL (ref 0.0–149.0)
VLDL: 23.2 mg/dL (ref 0.0–40.0)

## 2022-10-06 LAB — HEPATIC FUNCTION PANEL
ALT: 13 U/L (ref 0–35)
AST: 20 U/L (ref 0–37)
Albumin: 4.7 g/dL (ref 3.5–5.2)
Alkaline Phosphatase: 111 U/L (ref 39–117)
Bilirubin, Direct: 0.1 mg/dL (ref 0.0–0.3)
Total Bilirubin: 0.3 mg/dL (ref 0.2–1.2)
Total Protein: 7.4 g/dL (ref 6.0–8.3)

## 2022-10-06 LAB — VITAMIN D 25 HYDROXY (VIT D DEFICIENCY, FRACTURES): VITD: 39.06 ng/mL (ref 30.00–100.00)

## 2022-10-06 LAB — HEMOGLOBIN A1C: Hgb A1c MFr Bld: 6.4 % (ref 4.6–6.5)

## 2022-10-11 ENCOUNTER — Telehealth: Payer: Self-pay | Admitting: Internal Medicine

## 2022-10-11 MED ORDER — ATORVASTATIN CALCIUM 20 MG PO TABS: ORAL_TABLET | ORAL | 2 refills | Status: DC

## 2022-10-11 NOTE — Telephone Encounter (Signed)
Prescription Request  10/11/2022  LOV: 07/14/2022  What is the name of the medication or equipment?  atorvastatin (LIPITOR) 20 MG tablet  Have you contacted your pharmacy to request a refill? Yes   Which pharmacy would you like this sent to?  [id number] - [place], [place] - [address]  [place] [address] Phone: [phone] Fax: [phone]    Patient notified that their request is being sent to the clinical staff for review and that they should receive a response within 2 business days.   Please advise at [facility] [phone]

## 2022-10-11 NOTE — Telephone Encounter (Signed)
Sent refill to [facility] 

## 2022-10-14 ENCOUNTER — Ambulatory Visit: Payer: BC Managed Care – PPO | Admitting: Internal Medicine

## 2022-10-18 ENCOUNTER — Ambulatory Visit: Payer: BC Managed Care – PPO | Admitting: Internal Medicine

## 2022-10-18 VITALS — BP 134/80 | HR 76 | Temp 98.1°F | Ht 67.0 in | Wt 191.0 lb

## 2022-10-18 DIAGNOSIS — E559 Vitamin D deficiency, unspecified: Secondary | ICD-10-CM | POA: Diagnosis not present

## 2022-10-18 DIAGNOSIS — N1831 Chronic kidney disease, stage 3a: Secondary | ICD-10-CM | POA: Diagnosis not present

## 2022-10-18 DIAGNOSIS — E1165 Type 2 diabetes mellitus with hyperglycemia: Secondary | ICD-10-CM

## 2022-10-18 DIAGNOSIS — E538 Deficiency of other specified B group vitamins: Secondary | ICD-10-CM | POA: Diagnosis not present

## 2022-10-18 DIAGNOSIS — I10 Essential (primary) hypertension: Secondary | ICD-10-CM

## 2022-10-18 NOTE — Progress Notes (Unsigned)
Patient ID: Jessica Daniels, female   DOB: 01-15-1961, 62 y.o.   MRN: [id number]        Chief Complaint: follow up low vit d, low b12, dm, htn, ckd       HPI:  Jessica Daniels is a 62 y.o. female here overall doing well.  Pt denies chest pain, increased sob or doe, wheezing, orthopnea, PND, increased LE swelling, palpitations, dizziness or syncope.   Pt denies polydipsia, polyuria, or new focal neuro s/s.    Pt denies fever, night sweats, loss of appetite, or other constitutional symptoms  Has lost significant wt with ozempic, hopng to be able to stop some medication.  No low sugars.         Wt Readings from Last 3 Encounters:  10/18/22 191 lb (86.6 kg)  08/09/22 199 lb (90.3 kg)  07/14/22 199 lb (90.3 kg)   BP Readings from Last 3 Encounters:  10/18/22 134/80  07/14/22 130/82  04/14/22 132/76         Past Medical History:  Diagnosis Date   Allergic rhinitis    Anemia    Anxiety    Depression    Diabetes mellitus    Hyperlipidemia    Hypertension    Past Surgical History:  Procedure Laterality Date   CHOLECYSTECTOMY     LUMBAR LAMINECTOMY      reports that she has never smoked. She has never used smokeless tobacco. She reports that she does not drink alcohol and does not use drugs. family history includes Diabetes in an other family member; Heart attack in her mother; Lung cancer in her father. Allergies  Allergen Reactions   Crestor [Rosuvastatin]     Elevated liver enzymes   Current Outpatient Medications on File Prior to Visit  Medication Sig Dispense Refill   albuterol (VENTOLIN HFA) 108 (90 Base) MCG/ACT inhaler Inhale 2 puffs into the lungs every 6 (six) hours as needed for wheezing or shortness of breath. 8 g 1   amLODipine (NORVASC) 10 MG tablet TAKE 1 TABLET(10 MG) BY MOUTH DAILY 90 tablet 1   aspirin EC 81 MG tablet Take 1 tablet (81 mg total) by mouth daily. 90 tablet 11   atorvastatin (LIPITOR) 20 MG tablet 1 tab by mouth once daily 90 tablet 2    benazepril (LOTENSIN) 40 MG tablet 1 Tablet by mouth daily. 90 tablet 3   Cholecalciferol (THERA-D 4000) 100 MCG (4000 UT) TABS 1 tab by mouth once daily 30 tablet 99   Cyanocobalamin 1000 MCG TBCR Take 1 tablet by mouth every other day.     dapagliflozin propanediol (FARXIGA) 10 MG TABS tablet Take 1 tablet (10 mg total) by mouth daily before breakfast. 90 tablet 3   diclofenac (VOLTAREN) 75 MG EC tablet Take 75 mg by mouth 2 (two) times daily as needed.     fluticasone (FLONASE) 50 MCG/ACT nasal spray Place 2 sprays into both nostrils daily. 16 g 6   glucose blood (ONETOUCH VERIO) test strip Use to check blood sugars twice a day 100 each 11   loratadine (CLARITIN) 10 MG tablet Take 1 tablet (10 mg total) by mouth daily. 15 tablet 0   meloxicam (MOBIC) 15 MG tablet Take 1 tablet (15 mg total) by mouth daily. 90 tablet 1   metFORMIN (GLUCOPHAGE-XR) 500 MG 24 hr tablet Take 4 tablets (2,000 mg total) by mouth daily with breakfast. 360 tablet 3   methocarbamol (ROBAXIN) 500 MG tablet Take 1 tablet (500 mg total) by mouth  every 8 (eight) hours as needed. 30 tablet 0   Multiple Vitamin (MULTI-VITAMIN) tablet Take 1 tablet by mouth daily.     ONETOUCH DELICA LANCETS 33G MISC Use to help check blood sugars twice a day 100 each 5   repaglinide (PRANDIN) 0.5 MG tablet Take 1 tablet (0.5 mg total) by mouth 2 (two) times daily before a meal. 180 tablet 3   RESTASIS 0.05 % ophthalmic emulsion 1 drop 2 (two) times daily.     Semaglutide, 2 MG/DOSE, (OZEMPIC, 2 MG/DOSE,) 8 MG/3ML SOPN Inject 2 mg into the skin once a week. 9 mL 3   No current facility-administered medications on file prior to visit.        ROS:  All others reviewed and negative.  Objective        PE:  BP 134/80   Pulse 76   Temp 98.1 F (36.7 C) (Oral)   Ht  (1.702 m)   Wt 191 lb (86.6 kg)   LMP 08/06/2015   SpO2 97%   BMI 29.91 kg/m                 Constitutional: Pt appears in NAD               HENT: Head: NCAT.                 Right Ear: External ear normal.                 Left Ear: External ear normal.                Eyes: . Pupils are equal, round, and reactive to light. Conjunctivae and EOM are normal               Nose: without d/c or deformity               Neck: Neck supple. Gross normal ROM               Cardiovascular: Normal rate and regular rhythm.                 Pulmonary/Chest: Effort normal and breath sounds without rales or wheezing.                Abd:  Soft, NT, ND, + BS, no organomegaly               Neurological: Pt is alert. At baseline orientation, motor grossly intact               Skin: Skin is warm. No rashes, no other new lesions, LE edema - none               Psychiatric: Pt behavior is normal without agitation   Micro: none  Cardiac tracings I have personally interpreted today:  none  Pertinent Radiological findings (summarize): none   Lab Results  Component Value Date   WBC 6.0 07/14/2022   HGB 12.4 07/14/2022   HCT 39.9 07/14/2022   PLT 372.0 07/14/2022   GLUCOSE 92 10/06/2022   CHOL 169 10/06/2022   TRIG 116.0 10/06/2022   HDL 62.20 10/06/2022   LDLDIRECT 137.4 10/20/2009   LDLCALC 84 10/06/2022   ALT 13 10/06/2022   AST 20 10/06/2022   NA 140 10/06/2022   K 3.9 10/06/2022   CL 104 10/06/2022   CREATININE 0.90 10/06/2022   BUN 12 10/06/2022   CO2 27 10/06/2022   TSH 1.28 07/14/2022   HGBA1C  6.4 10/06/2022   MICROALBUR <0.7 07/14/2022   Assessment/Plan:  Jessica Daniels is a 62 y.o. Black or African American [2] female with  has a past medical history of Allergic rhinitis, Anemia, Anxiety, Depression, Diabetes mellitus, Hyperlipidemia, and Hypertension.  B12 deficiency Lab Results  Component Value Date   VITAMINB12 220 07/14/2022   Low, to start oral replacement - b12 1000 mcg qd   CKD (chronic kidney disease) stage 3, GFR 30-59 ml/min (HCC) Lab Results  Component Value Date   CREATININE 0.90 10/06/2022   Stable overall, cont to avoid  nephrotoxins   Diabetes Lab Results  Component Value Date   HGBA1C 6.4 10/06/2022   Good control, no low sugars but likely to have further wt loss in future with ozempic so will need d/c glpiapizde,  pt to continue current medical treatment farxiga 10 mg qd, metformin ER 500 mg 4 qd, prandin 0.5 bid, ozempic 2 mg weekly   Essential hypertension BP Readings from Last 3 Encounters:  10/18/22 134/80  07/14/22 130/82  04/14/22 132/76   Stable, pt to continue medical treatment norvasc 10 mg qd, lostensin 40 mg qd   Vitamin D deficiency Last vitamin D Lab Results  Component Value Date   VD25OH 39.06 10/06/2022   Still low despite low dose vit d, ok to double oral replacement  Followup: Return in about 3 months (around 01/17/2023).  Oliver Barre, MD 10/19/2022 4:44 AM [facility] Internal Medicine

## 2022-10-18 NOTE — Patient Instructions (Signed)
Please take all new medication as recommended - the OTC B12 1000 mcg per day  Please take OTC Vitamin D3 at 2000 units per day, indefinitely  Ok to stop the glipizide ER 5 mg per day  Please continue all other medications as before, including the high dose ozempic  Please have the pharmacy call with any other refills you may need.  Please continue your efforts at being more active, low cholesterol diet, and weight control.  Please keep your appointments with your specialists as you may have planned  [facility] to hold on lab testing today  Please make an Appointment to return in 3 months, or sooner if needed, also with Lab Appointment for testing done 3-5 days before at the FIRST FLOOR Lab (so this is for TWO appointments - please see the scheduling desk as you leave)

## 2022-10-19 ENCOUNTER — Encounter: Payer: Self-pay | Admitting: Internal Medicine

## 2022-10-19 DIAGNOSIS — E538 Deficiency of other specified B group vitamins: Secondary | ICD-10-CM | POA: Insufficient documentation

## 2022-10-19 NOTE — Assessment & Plan Note (Signed)
Last vitamin D Lab Results  Component Value Date   VD25OH 39.06 10/06/2022   Still low despite low dose vit d, ok to double oral replacement

## 2022-10-19 NOTE — Assessment & Plan Note (Signed)
Lab Results  Component Value Date   HGBA1C 6.4 10/06/2022   Good control, no low sugars but likely to have further wt loss in future with ozempic so will need d/c glpiapizde,  pt to continue current medical treatment farxiga 10 mg qd, metformin ER 500 mg 4 qd, prandin 0.5 bid, ozempic 2 mg weekly

## 2022-10-19 NOTE — Assessment & Plan Note (Signed)
BP Readings from Last 3 Encounters:  10/18/22 134/80  07/14/22 130/82  04/14/22 132/76   Stable, pt to continue medical treatment norvasc 10 mg qd, lostensin 40 mg qd

## 2022-10-19 NOTE — Assessment & Plan Note (Signed)
Lab Results  Component Value Date   VITAMINB12 220 07/14/2022   Low, to start oral replacement - b12 1000 mcg qd

## 2022-10-19 NOTE — Assessment & Plan Note (Signed)
Lab Results  Component Value Date   CREATININE 0.90 10/06/2022   Stable overall, cont to avoid nephrotoxins

## 2023-01-14 ENCOUNTER — Ambulatory Visit: Payer: BC Managed Care – PPO | Admitting: Internal Medicine

## 2023-01-14 ENCOUNTER — Encounter: Payer: Self-pay | Admitting: Internal Medicine

## 2023-01-14 VITALS — BP 122/80 | HR 81 | Temp 98.7°F | Ht 67.0 in | Wt 196.0 lb

## 2023-01-14 DIAGNOSIS — E559 Vitamin D deficiency, unspecified: Secondary | ICD-10-CM

## 2023-01-14 DIAGNOSIS — I10 Essential (primary) hypertension: Secondary | ICD-10-CM | POA: Diagnosis not present

## 2023-01-14 DIAGNOSIS — E538 Deficiency of other specified B group vitamins: Secondary | ICD-10-CM

## 2023-01-14 DIAGNOSIS — E1165 Type 2 diabetes mellitus with hyperglycemia: Secondary | ICD-10-CM | POA: Diagnosis not present

## 2023-01-14 DIAGNOSIS — E7800 Pure hypercholesterolemia, unspecified: Secondary | ICD-10-CM

## 2023-01-14 DIAGNOSIS — M5440 Lumbago with sciatica, unspecified side: Secondary | ICD-10-CM

## 2023-01-14 DIAGNOSIS — N1831 Chronic kidney disease, stage 3a: Secondary | ICD-10-CM

## 2023-01-14 LAB — POCT GLYCOSYLATED HEMOGLOBIN (HGB A1C): Hemoglobin A1C: 6.1 % — AB (ref 4.0–5.6)

## 2023-01-14 MED ORDER — CYCLOBENZAPRINE HCL 5 MG PO TABS: 5.0000 mg | ORAL_TABLET | Freq: Three times a day (TID) | ORAL | 1 refills | Status: DC | PRN

## 2023-01-14 NOTE — Patient Instructions (Addendum)
Your A1c was done today in the office - 6.1 - so no medication reducing today  Please take all new medication as prescribed - the flexeril muscle relaxer as needed  Please continue all other medications as before, and refills have been done if requested.  Please have the pharmacy call with any other refills you may need.  Please keep your appointments with your specialists as you may have planned  Please make an Appointment to return in 6 months, or sooner if needed, also with Lab Appointment for testing done 3-5 days before at the FIRST FLOOR Lab (so this is for TWO appointments - please see the scheduling desk as you leave)

## 2023-01-14 NOTE — Progress Notes (Unsigned)
Patient ID: Jessica Daniels, female   DOB: Jan 15, 1961, 62 y.o.   MRN: [id number]        Chief Complaint: follow up HTN, HLD and hyperglycemia ***       HPI:  Jessica Daniels is a 62 y.o. female here with c/o          Has mammogram scheduled for next wk.  Peak wt has been over 240 before ozempic.  Wt Readings from Last 3 Encounters:  01/14/23 196 lb (88.9 kg)  10/18/22 191 lb (86.6 kg)  08/09/22 199 lb (90.3 kg)   BP Readings from Last 3 Encounters:  01/14/23 122/80  10/18/22 134/80  07/14/22 130/82         Past Medical History:  Diagnosis Date   Allergic rhinitis    Anemia    Anxiety    Depression    Diabetes mellitus    Hyperlipidemia    Hypertension    Past Surgical History:  Procedure Laterality Date   CHOLECYSTECTOMY     LUMBAR LAMINECTOMY      reports that she has never smoked. She has never used smokeless tobacco. She reports that she does not drink alcohol and does not use drugs. family history includes Diabetes in an other family member; Heart attack in her mother; Lung cancer in her father. Allergies  Allergen Reactions   Crestor [Rosuvastatin]     Elevated liver enzymes   Current Outpatient Medications on File Prior to Visit  Medication Sig Dispense Refill   albuterol (VENTOLIN HFA) 108 (90 Base) MCG/ACT inhaler Inhale 2 puffs into the lungs every 6 (six) hours as needed for wheezing or shortness of breath. 8 g 1   amLODipine (NORVASC) 10 MG tablet TAKE 1 TABLET(10 MG) BY MOUTH DAILY 90 tablet 1   aspirin EC 81 MG tablet Take 1 tablet (81 mg total) by mouth daily. 90 tablet 11   atorvastatin (LIPITOR) 20 MG tablet 1 tab by mouth once daily 90 tablet 2   benazepril (LOTENSIN) 40 MG tablet 1 Tablet by mouth daily. 90 tablet 3   Cholecalciferol (THERA-D 4000) 100 MCG (4000 UT) TABS 1 tab by mouth once daily 30 tablet 99   Cyanocobalamin 1000 MCG TBCR Take 1 tablet by mouth every other day.     dapagliflozin propanediol (FARXIGA) 10 MG TABS tablet Take 1  tablet (10 mg total) by mouth daily before breakfast. 90 tablet 3   diclofenac (VOLTAREN) 75 MG EC tablet Take 75 mg by mouth 2 (two) times daily as needed.     fluticasone (FLONASE) 50 MCG/ACT nasal spray Place 2 sprays into both nostrils daily. 16 g 6   glucose blood (ONETOUCH VERIO) test strip Use to check blood sugars twice a day 100 each 11   loratadine (CLARITIN) 10 MG tablet Take 1 tablet (10 mg total) by mouth daily. 15 tablet 0   meloxicam (MOBIC) 15 MG tablet Take 1 tablet (15 mg total) by mouth daily. 90 tablet 1   metFORMIN (GLUCOPHAGE-XR) 500 MG 24 hr tablet Take 4 tablets (2,000 mg total) by mouth daily with breakfast. 360 tablet 3   methocarbamol (ROBAXIN) 500 MG tablet Take 1 tablet (500 mg total) by mouth every 8 (eight) hours as needed. 30 tablet 0   Multiple Vitamin (MULTI-VITAMIN) tablet Take 1 tablet by mouth daily.     ONETOUCH DELICA LANCETS 33G MISC Use to help check blood sugars twice a day 100 each 5   repaglinide (PRANDIN) 0.5 MG tablet Take 1  tablet (0.5 mg total) by mouth 2 (two) times daily before a meal. 180 tablet 3   RESTASIS 0.05 % ophthalmic emulsion 1 drop 2 (two) times daily.     Semaglutide, 2 MG/DOSE, (OZEMPIC, 2 MG/DOSE,) 8 MG/3ML SOPN Inject 2 mg into the skin once a week. 9 mL 3   No current facility-administered medications on file prior to visit.        ROS:  All others reviewed and negative.  Objective        PE:  BP 122/80 (BP Location: Left Arm, Patient Position: Sitting, Cuff Size: Large)   Pulse 81   Temp 98.7 F (37.1 C) (Oral)   Ht 5\' 7"  (1.702 m)   Wt 196 lb (88.9 kg)   LMP 08/06/2015   SpO2 99%   BMI 30.70 kg/m                 Constitutional: Pt appears in NAD               HENT: Head: NCAT.                Right Ear: External ear normal.                 Left Ear: External ear normal.                Eyes: . Pupils are equal, round, and reactive to light. Conjunctivae and EOM are normal               Nose: without d/c or  deformity               Neck: Neck supple. Gross normal ROM               Cardiovascular: Normal rate and regular rhythm.                 Pulmonary/Chest: Effort normal and breath sounds without rales or wheezing.                Abd:  Soft, NT, ND, + BS, no organomegaly               Neurological: Pt is alert. At baseline orientation, motor grossly intact               Skin: Skin is warm. No rashes, no other new lesions, LE edema - ***               Psychiatric: Pt behavior is normal without agitation   Micro: none  Cardiac tracings I have personally interpreted today:  none  Pertinent Radiological findings (summarize): none   Lab Results  Component Value Date   WBC 6.0 07/14/2022   HGB 12.4 07/14/2022   HCT 39.9 07/14/2022   PLT 372.0 07/14/2022   GLUCOSE 92 10/06/2022   CHOL 169 10/06/2022   TRIG 116.0 10/06/2022   HDL 62.20 10/06/2022   LDLDIRECT 137.4 10/20/2009   LDLCALC 84 10/06/2022   ALT 13 10/06/2022   AST 20 10/06/2022   NA 140 10/06/2022   K 3.9 10/06/2022   CL 104 10/06/2022   CREATININE 0.90 10/06/2022   BUN 12 10/06/2022   CO2 27 10/06/2022   TSH 1.28 07/14/2022   HGBA1C 6.4 10/06/2022   MICROALBUR <0.7 07/14/2022   POCT -  Hemoglobin A1C 4.0 - 5.6 % 6.1 Abnormal  6.4 R, CM 6.5 R    Assessment/Plan:  Jessica Daniels is a 62 y.o. Black or African  American [2] female with  has a past medical history of Allergic rhinitis, Anemia, Anxiety, Depression, Diabetes mellitus, Hyperlipidemia, and Hypertension.  No problem-specific Assessment & Plan notes found for this encounter.  Followup: No follow-ups on file.  Oliver Barre, MD 01/14/2023 11:01 AM  [facility] - [facility] Internal Medicine

## 2023-01-16 NOTE — Assessment & Plan Note (Signed)
Lab Results  Component Value Date   CREATININE 0.90 10/06/2022   Stable overall, cont to avoid nephrotoxins  

## 2023-01-16 NOTE — Assessment & Plan Note (Addendum)
Lab Results  Component Value Date   LDLCALC 84 10/06/2022   Uncontrolled, goal ldl < 70, pt to continue current statin lipitor 20 mg every day and better lower chol diet, for f/u lab next visit

## 2023-01-16 NOTE — Assessment & Plan Note (Signed)
BP Readings from Last 3 Encounters:  01/14/23 122/80  10/18/22 134/80  07/14/22 130/82   Stable, pt to continue medical treatment norvasc 10 every day, lotensin 40 qd

## 2023-01-16 NOTE — Assessment & Plan Note (Signed)
Lab Results  Component Value Date   HGBA1C 6.1 (A) 01/14/2023   Stable, pt to continue current medical treatment ozempic 2 mg weekly, prandin 0.5 mg bid, metformin ER 500 mg - 4 qd

## 2023-01-16 NOTE — Assessment & Plan Note (Signed)
Last vitamin D Lab Results  Component Value Date   VD25OH 39.06 10/06/2022   Low, to start oral replacement

## 2023-01-16 NOTE — Assessment & Plan Note (Signed)
This time appears to be right lower lumbar spasm - for flexeril 5 tid prn

## 2023-01-20 LAB — HM MAMMOGRAPHY

## 2023-02-08 ENCOUNTER — Encounter: Payer: Self-pay | Admitting: Internal Medicine

## 2023-02-08 ENCOUNTER — Ambulatory Visit: Payer: BC Managed Care – PPO | Admitting: Internal Medicine

## 2023-02-08 VITALS — BP 130/82 | HR 79 | Temp 97.9°F | Ht 67.0 in | Wt 193.0 lb

## 2023-02-08 DIAGNOSIS — J069 Acute upper respiratory infection, unspecified: Secondary | ICD-10-CM

## 2023-02-08 MED ORDER — BENZONATATE 200 MG PO CAPS: 200.0000 mg | ORAL_CAPSULE | Freq: Three times a day (TID) | ORAL | 0 refills | Status: DC | PRN

## 2023-02-08 NOTE — Assessment & Plan Note (Signed)
Rx tessalon perles and per patient request work note done. No signs of bacterial infection and symptoms improving appropriately.

## 2023-02-08 NOTE — Patient Instructions (Signed)
We have sent in the tessalon perles for cough to use up to 3 times a day.

## 2023-02-08 NOTE — Progress Notes (Signed)
   Subjective:   Patient ID: Jessica Daniels, female    DOB: [birth year], 62 y.o.   MRN: [id number]  Cough Associated symptoms include myalgias, postnasal drip and rhinorrhea. Pertinent negatives include no chills, ear pain, fever, sore throat, shortness of breath or wheezing.   The patient is a 62YO female coming in for sick visit started 3-4 days ago. Overall improving.  Review of Systems  Constitutional:  Negative for activity change, appetite change, chills, fatigue, fever and unexpected weight change.  HENT:  Positive for congestion, postnasal drip and rhinorrhea. Negative for ear discharge, ear pain, sinus pressure, sinus pain, sneezing, sore throat, tinnitus, trouble swallowing and voice change.   Eyes: Negative.   Respiratory:  Positive for cough. Negative for chest tightness, shortness of breath and wheezing.   Cardiovascular: Negative.   Gastrointestinal: Negative.   Musculoskeletal:  Positive for myalgias.  Neurological: Negative.     Objective:  Physical Exam Constitutional:      Appearance: She is well-developed.  HENT:     Head: Normocephalic and atraumatic.     Comments: Oropharynx with redness and clear drainage, nose with swollen turbinates, TMs normal bilaterally.  Neck:     Thyroid: No thyromegaly.  Cardiovascular:     Rate and Rhythm: Normal rate and regular rhythm.  Pulmonary:     Effort: Pulmonary effort is normal. No respiratory distress.     Breath sounds: Normal breath sounds. No wheezing or rales.  Abdominal:     Palpations: Abdomen is soft.  Musculoskeletal:        General: No tenderness.     Cervical back: Normal range of motion.  Lymphadenopathy:     Cervical: No cervical adenopathy.  Skin:    General: Skin is warm and dry.  Neurological:     Mental Status: She is alert and oriented to person, place, and time.     Vitals:   02/08/23 1007  BP: 130/82  Pulse: 79  Temp: 97.9 F (36.6 C)  TempSrc: Oral  SpO2: 99%  Weight: 193 lb (87.5  kg)  Height: 5\' 7"  (1.702 m)    Assessment & Plan:

## 2023-02-25 ENCOUNTER — Other Ambulatory Visit: Payer: Self-pay

## 2023-02-25 MED ORDER — AMLODIPINE BESYLATE 10 MG PO TABS: ORAL_TABLET | ORAL | 3 refills | Status: DC

## 2023-03-25 ENCOUNTER — Telehealth: Payer: Self-pay

## 2023-03-29 ENCOUNTER — Telehealth: Payer: Self-pay

## 2023-03-29 ENCOUNTER — Other Ambulatory Visit (HOSPITAL_COMMUNITY): Payer: Self-pay

## 2023-03-29 NOTE — Telephone Encounter (Signed)
Pharmacy Patient Advocate Encounter   Received notification from Pt Calls Messages that prior authorization for Ozempic 8mg /81ml is required/requested.   Insurance verification completed.   The patient is insured through [employer] [facility] .   Per test claim: PA required; PA submitted to [employer] [facility] via CoverMyMeds Key/confirmation #/EOC [id number] Status is pending

## 2023-03-31 ENCOUNTER — Other Ambulatory Visit (HOSPITAL_COMMUNITY): Payer: Self-pay

## 2023-03-31 NOTE — Telephone Encounter (Signed)
Pharmacy Patient Advocate Encounter  Received notification from [employer] [facility] that Prior Authorization for Ozempic 8mg /27ml has been APPROVED from 03/29/23 to 03/28/26   PA #/Case ID/Reference #: [id number]  Approval letter indexed to media tab

## 2023-06-01 ENCOUNTER — Telehealth: Payer: Self-pay | Admitting: Internal Medicine

## 2023-06-01 NOTE — Telephone Encounter (Signed)
Caller & Relationship to patient: Self  Call back number: [phone]   Date of last office visit: 7.12.24  Date of next office visit: 1.8.24  Medication(s) to be refilled:  metFORMIN (GLUCOPHAGE-XR) 500 MG 24 hr tablet   Preferred Pharmacy:  [facility] #[id number]   Phone: [phone]  Fax: [phone]

## 2023-06-01 NOTE — Telephone Encounter (Signed)
That medication is being prescribed by PT Endo doctor, Pt must contact them for refill.

## 2023-07-08 ENCOUNTER — Telehealth: Payer: Self-pay | Admitting: Internal Medicine

## 2023-07-08 ENCOUNTER — Other Ambulatory Visit: Payer: Self-pay

## 2023-07-08 MED ORDER — ATORVASTATIN CALCIUM 20 MG PO TABS: ORAL_TABLET | ORAL | 2 refills | Status: DC

## 2023-07-08 NOTE — Telephone Encounter (Signed)
 Refill sent.

## 2023-07-08 NOTE — Telephone Encounter (Signed)
 Copied from CRM [id number]. Topic: Clinical - Medication Refill >> Jul 08, 2023 10:34 AM [place] DEL wrote: Most Recent Primary Care Visit:  Provider: ROLLENE NORRIS A  Department: [facility]  Visit Type: OFFICE VISIT  Date: 02/08/2023  Medication: atorvastatin  (LIPITOR) 20 MG tablet  Has the patient contacted their pharmacy? Yes (Agent: If no, request that the patient contact the pharmacy for the refill. If patient does not wish to contact the pharmacy document the reason why and proceed with request.) (Agent: If yes, when and what did the pharmacy advise?)  Is this the correct pharmacy for this prescription? Yes If no, delete pharmacy and type the correct one.  This is the patient's preferred pharmacy:  [facility] [address] [place] - [address] [place] [address] Phone: [phone] Fax: [phone]   Has the prescription been filled recently? No  Is the patient out of the medication? No  Has the patient been seen for an appointment in the last year OR does the patient have an upcoming appointment? Yes  Can we respond through MyChart? Yes  Agent: Please be advised that Rx refills may take up to 3 business days. We ask that you follow-up with your pharmacy.

## 2023-07-13 ENCOUNTER — Encounter: Payer: Self-pay | Admitting: Internal Medicine

## 2023-07-13 ENCOUNTER — Ambulatory Visit: Payer: 59 | Admitting: Internal Medicine

## 2023-07-13 VITALS — BP 126/78 | HR 69 | Temp 98.3°F | Ht 67.0 in | Wt 196.0 lb

## 2023-07-13 DIAGNOSIS — E1165 Type 2 diabetes mellitus with hyperglycemia: Secondary | ICD-10-CM | POA: Diagnosis not present

## 2023-07-13 DIAGNOSIS — Z0001 Encounter for general adult medical examination with abnormal findings: Secondary | ICD-10-CM | POA: Diagnosis not present

## 2023-07-13 DIAGNOSIS — E559 Vitamin D deficiency, unspecified: Secondary | ICD-10-CM | POA: Diagnosis not present

## 2023-07-13 DIAGNOSIS — N1831 Chronic kidney disease, stage 3a: Secondary | ICD-10-CM

## 2023-07-13 DIAGNOSIS — E538 Deficiency of other specified B group vitamins: Secondary | ICD-10-CM

## 2023-07-13 DIAGNOSIS — I10 Essential (primary) hypertension: Secondary | ICD-10-CM

## 2023-07-13 DIAGNOSIS — D509 Iron deficiency anemia, unspecified: Secondary | ICD-10-CM

## 2023-07-13 DIAGNOSIS — R350 Frequency of micturition: Secondary | ICD-10-CM

## 2023-07-13 DIAGNOSIS — E7800 Pure hypercholesterolemia, unspecified: Secondary | ICD-10-CM

## 2023-07-13 DIAGNOSIS — Z7985 Long-term (current) use of injectable non-insulin antidiabetic drugs: Secondary | ICD-10-CM

## 2023-07-13 LAB — URINALYSIS, ROUTINE W REFLEX MICROSCOPIC
Bilirubin Urine: NEGATIVE
Hgb urine dipstick: NEGATIVE
Ketones, ur: NEGATIVE
Leukocytes,Ua: NEGATIVE
Nitrite: NEGATIVE
RBC / HPF: NONE SEEN (ref 0–?)
Specific Gravity, Urine: 1.015 (ref 1.000–1.030)
Total Protein, Urine: NEGATIVE
Urine Glucose: >=1000 — AB
Urobilinogen, UA: 0.2 (ref 0.0–1.0)
pH: 6 (ref 5.0–8.0)

## 2023-07-13 LAB — BASIC METABOLIC PANEL
BUN: 11 mg/dL (ref 6–23)
CO2: 27 meq/L (ref 19–32)
Calcium: 10.4 mg/dL (ref 8.4–10.5)
Chloride: 103 meq/L (ref 96–112)
Creatinine, Ser: 0.9 mg/dL (ref 0.40–1.20)
GFR: 69.37 mL/min (ref >60.00)
Glucose, Bld: 100 mg/dL — ABNORMAL HIGH (ref 70–99)
Potassium: 4.1 meq/L (ref 3.5–5.1)
Sodium: 142 meq/L (ref 135–145)

## 2023-07-13 LAB — HEPATIC FUNCTION PANEL
ALT: 19 U/L (ref 0–35)
AST: 20 U/L (ref 0–37)
Albumin: 4.8 g/dL (ref 3.5–5.2)
Alkaline Phosphatase: 122 U/L — ABNORMAL HIGH (ref 39–117)
Bilirubin, Direct: 0.1 mg/dL (ref 0.0–0.3)
Total Bilirubin: 0.5 mg/dL (ref 0.2–1.2)
Total Protein: 7.8 g/dL (ref 6.0–8.3)

## 2023-07-13 LAB — CBC WITH DIFFERENTIAL/PLATELET
Basophils Absolute: 0 10*3/uL (ref 0.0–0.1)
Basophils Relative: 0.4 % (ref 0.0–3.0)
Eosinophils Absolute: 0 10*3/uL (ref 0.0–0.7)
Eosinophils Relative: 0.5 % (ref 0.0–5.0)
HCT: 41 % (ref 36.0–46.0)
Hemoglobin: 12.9 g/dL (ref 12.0–15.0)
Lymphocytes Relative: 27.4 % (ref 12.0–46.0)
Lymphs Abs: 1.8 10*3/uL (ref 0.7–4.0)
MCHC: 31.4 g/dL (ref 30.0–36.0)
MCV: 70.5 fL — ABNORMAL LOW (ref 78.0–100.0)
Monocytes Absolute: 0.5 10*3/uL (ref 0.1–1.0)
Monocytes Relative: 7 % (ref 3.0–12.0)
Neutro Abs: 4.2 10*3/uL (ref 1.4–7.7)
Neutrophils Relative %: 64.7 % (ref 43.0–77.0)
Platelets: 326 10*3/uL (ref 150.0–400.0)
RBC: 5.82 Mil/uL — ABNORMAL HIGH (ref 3.87–5.11)
RDW: 15.6 % — ABNORMAL HIGH (ref 11.5–15.5)
WBC: 6.5 10*3/uL (ref 4.0–10.5)

## 2023-07-13 LAB — LIPID PANEL
Cholesterol: 156 mg/dL (ref 0–200)
HDL: 65.5 mg/dL (ref >39.00)
LDL Cholesterol: 73 mg/dL (ref 0–99)
NonHDL: 90.06
Total CHOL/HDL Ratio: 2
Triglycerides: 83 mg/dL (ref 0.0–149.0)
VLDL: 16.6 mg/dL (ref 0.0–40.0)

## 2023-07-13 LAB — MICROALBUMIN / CREATININE URINE RATIO
Creatinine,U: 45.3 mg/dL
Microalb Creat Ratio: 1.5 mg/g (ref 0.0–30.0)
Microalb, Ur: <0.7 mg/dL (ref 0.0–1.9)

## 2023-07-13 LAB — VITAMIN B12: Vitamin B-12: 514 pg/mL (ref 211–911)

## 2023-07-13 LAB — TSH: TSH: 2.28 u[IU]/mL (ref 0.35–5.50)

## 2023-07-13 LAB — HEMOGLOBIN A1C: Hgb A1c MFr Bld: 6.9 % — ABNORMAL HIGH (ref 4.6–6.5)

## 2023-07-13 LAB — VITAMIN D 25 HYDROXY (VIT D DEFICIENCY, FRACTURES): VITD: 27.62 ng/mL — ABNORMAL LOW (ref 30.00–100.00)

## 2023-07-13 MED ORDER — MELOXICAM 15 MG PO TABS: 15.0000 mg | ORAL_TABLET | Freq: Every day | ORAL | 3 refills | Status: DC

## 2023-07-13 NOTE — Progress Notes (Signed)
 Patient ID: Jessica Daniels, female   DOB: [birth year], 63 y.o.   MRN: [id number]         Chief Complaint:: wellness exam and dm, urinary frequency, iron deficiency, hld, htn, dm, low vit d       HPI:  Jessica Daniels is a 63 y.o. female here for wellness exam; plans to see eye doctor this month, decliens all immunizations, and colonoscopy, o/w up to date                        Also Pt denies chest pain, increased sob or doe, wheezing, orthopnea, PND, increased LE swelling, palpitations, dizziness or syncope.   Pt denies polydipsia, polyuria, or new focal neuro s/s.    Pt denies fever, wt loss, night sweats, loss of appetite, or other constitutional symptoms   Peak wt was about 240, losing gradually . Denies urinary symptoms such as dysuria, urgency, flank pain, hematuria or n/v, fever, chills, except for urinary frequency worsening x 4-5 days.   Wt Readings from Last 3 Encounters:  07/13/23 196 lb (88.9 kg)  02/08/23 193 lb (87.5 kg)  01/14/23 196 lb (88.9 kg)   BP Readings from Last 3 Encounters:  07/13/23 126/78  02/08/23 130/82  01/14/23 122/80   Immunization History  Administered Date(s) Administered   Moderna Sars-Covid-2 Vaccination 09/02/2019, 10/01/2019, 05/05/2020   Health Maintenance Due  Topic Date Due   Pneumococcal Vaccine 35-49 Years old (1 of 2 - PCV) Never done   DTaP/Tdap/Td (1 - Tdap) Never done   Cervical Cancer Screening (HPV/Pap Cotest)  Never done   Colonoscopy  Never done   Zoster Vaccines- Shingrix (1 of 2) Never done   OPHTHALMOLOGY EXAM  07/13/2023      Past Medical History:  Diagnosis Date   Allergic rhinitis    Anemia    Anxiety    Depression    Diabetes mellitus    Hyperlipidemia    Hypertension    Past Surgical History:  Procedure Laterality Date   CHOLECYSTECTOMY     LUMBAR LAMINECTOMY      reports that she has never smoked. She has never used smokeless tobacco. She reports that she does not drink alcohol and does not use  drugs. family history includes Diabetes in an other family member; Heart attack in her mother; Lung cancer in her father. Allergies  Allergen Reactions   Crestor [Rosuvastatin]     Elevated liver enzymes   Current Outpatient Medications on File Prior to Visit  Medication Sig Dispense Refill   amLODipine  (NORVASC ) 10 MG tablet TAKE 1 TABLET(10 MG) BY MOUTH DAILY 90 tablet 3   atorvastatin  (LIPITOR) 20 MG tablet 1 tab by mouth once daily 90 tablet 2   benazepril  (LOTENSIN ) 40 MG tablet 1 Tablet by mouth daily. 90 tablet 3   dapagliflozin  propanediol (FARXIGA ) 10 MG TABS tablet Take 1 tablet (10 mg total) by mouth daily before breakfast. 90 tablet 3   albuterol  (VENTOLIN  HFA) 108 (90 Base) MCG/ACT inhaler Inhale 2 puffs into the lungs every 6 (six) hours as needed for wheezing or shortness of breath. (Patient not taking: Reported on 07/13/2023) 8 g 1   aspirin  EC 81 MG tablet Take 1 tablet (81 mg total) by mouth daily. (Patient not taking: Reported on 07/13/2023) 90 tablet 11   cyclobenzaprine  (FLEXERIL ) 5 MG tablet Take 1 tablet (5 mg total) by mouth 3 (three) times daily as needed. (Patient not taking: Reported on 07/13/2023) 40  tablet 1   fluticasone  (FLONASE ) 50 MCG/ACT nasal spray Place 2 sprays into both nostrils daily. (Patient not taking: Reported on 07/13/2023) 16 g 6   glucose blood (ONETOUCH VERIO) test strip Use to check blood sugars twice a day (Patient not taking: Reported on 07/13/2023) 100 each 11   Multiple Vitamin (MULTI-VITAMIN) tablet Take 1 tablet by mouth daily. (Patient not taking: Reported on 07/13/2023)     ONETOUCH DELICA LANCETS 33G MISC Use to help check blood sugars twice a day (Patient not taking: Reported on 07/13/2023) 100 each 5   RESTASIS 0.05 % ophthalmic emulsion 1 drop 2 (two) times daily. (Patient not taking: Reported on 07/13/2023)     Semaglutide , 2 MG/DOSE, (OZEMPIC , 2 MG/DOSE,) 8 MG/3ML SOPN Inject 2 mg into the skin once a week. (Patient not taking: Reported on 07/13/2023)  9 mL 3   No current facility-administered medications on file prior to visit.        ROS:  All others reviewed and negative.  Objective        PE:  BP 126/78 (BP Location: Left Arm, Patient Position: Sitting, Cuff Size: Normal)   Pulse 69   Temp 98.3 F (36.8 C) (Oral)   Ht 5' 7 (1.702 m)   Wt 196 lb (88.9 kg)   LMP 08/06/2015   SpO2 98%   BMI 30.70 kg/m                 Constitutional: Pt appears in NAD               HENT: Head: NCAT.                Right Ear: External ear normal.                 Left Ear: External ear normal.                Eyes: . Pupils are equal, round, and reactive to light. Conjunctivae and EOM are normal               Nose: without d/c or deformity               Neck: Neck supple. Gross normal ROM               Cardiovascular: Normal rate and regular rhythm.                 Pulmonary/Chest: Effort normal and breath sounds without rales or wheezing.                Abd:  Soft, NT, ND, + BS, no organomegaly               Neurological: Pt is alert. At baseline orientation, motor grossly intact               Skin: Skin is warm. No rashes, no other new lesions, LE edema - none               Psychiatric: Pt behavior is normal without agitation   Micro: none  Cardiac tracings I have personally interpreted today:  none  Pertinent Radiological findings (summarize): none   Lab Results  Component Value Date   WBC 6.5 07/13/2023   HGB 12.9 07/13/2023   HCT 41.0 07/13/2023   PLT 326.0 07/13/2023   GLUCOSE 100 (H) 07/13/2023   CHOL 156 07/13/2023   TRIG 83.0 07/13/2023   HDL 65.50 07/13/2023   LDLDIRECT 137.4 10/20/2009  LDLCALC 73 07/13/2023   ALT 19 07/13/2023   AST 20 07/13/2023   NA 142 07/13/2023   K 4.1 07/13/2023   CL 103 07/13/2023   CREATININE 0.90 07/13/2023   BUN 11 07/13/2023   CO2 27 07/13/2023   TSH 2.28 07/13/2023   HGBA1C 6.9 (H) 07/13/2023   MICROALBUR <0.7 07/13/2023   Assessment/Plan:  Jessica Daniels is a 63 y.o. Black  or African American [2] female with  has a past medical history of Allergic rhinitis, Anemia, Anxiety, Depression, Diabetes mellitus, Hyperlipidemia, and Hypertension.  Encounter for well adult exam with abnormal findings Age and sex appropriate education and counseling updated with regular exercise and diet Referrals for preventative services  - declines colonoscopy for now Immunizations addressed - all immunizations for now Smoking counseling  - none needed Evidence for depression or other mood disorder - none significant Most recent labs reviewed. I have personally reviewed and have noted: 1) the patient's medical and social history 2) The patient's current medications and supplements 3) The patient's height, weight, and BMI have been recorded in the chart   Hyperlipidemia Lab Results  Component Value Date   LDLCALC 73 07/13/2023   uncontrolled, pt to continue current statin lipitor 20 mg every day and lower chol diet, declines change for now   Iron deficiency anemia Lab Results  Component Value Date   IRON 88 12/13/2019   FERRITIN 96.6 12/13/2019   Also for f/u lab, no reen overt bleeding  Essential hypertension BP Readings from Last 3 Encounters:  07/13/23 126/78  02/08/23 130/82  01/14/23 122/80   Stable, pt to continue medical treatment norvasc  10 every day, lotensin  40 qd   Diabetes Lab Results  Component Value Date   HGBA1C 6.9 (H) 07/13/2023   Stable, pt to continue current medical treatment farxiga  10 every day, ozempic  2 mg once weekly but ok for d/c metformin  as has not taken recenlty   Vitamin D  deficiency Last vitamin D  Lab Results  Component Value Date   VD25OH 27.62 (L) 07/13/2023   Low, to start oral replacement   CKD (chronic kidney disease) stage 3, GFR 30-59 ml/min (HCC) Lab Results  Component Value Date   CREATININE 0.90 07/13/2023   Stable overall, cont to avoid nephrotoxins   B12 deficiency Lab Results  Component Value Date    VITAMINB12 514 07/13/2023   Stable, cont oral replacement - b12 1000 mcg qd   Urinary frequency Recent onset, can't r/o uti - for urine cx with labs  Followup: Return in about 6 months (around 01/10/2024).  Lynwood Rush, MD 07/16/2023 10:07 AM [facility]  Primary Care - [facility] Internal Medicine

## 2023-07-13 NOTE — Patient Instructions (Addendum)
 Ok to stop the metformin  as you have  You are given the work note today  Please continue all other medications as before, and refills have been done  - the meloxican for anti-inflammatory  Please have the pharmacy call with any other refills you may need.  Please continue your efforts at being more active, low cholesterol diet, and weight control.  You are otherwise up to date with prevention measures today.  Please keep your appointments with your specialists as you may have planned  Please go to the LAB at the blood drawing area for the tests to be done  You will be contacted by phone if any changes need to be made immediately.  Otherwise, you will receive a letter about your results with an explanation, but please check with MyChart first.  Please make an Appointment to return in 6 months, or sooner if needed

## 2023-07-14 LAB — URINE CULTURE: Result:: NO GROWTH

## 2023-07-16 ENCOUNTER — Encounter: Payer: Self-pay | Admitting: Internal Medicine

## 2023-07-16 DIAGNOSIS — R350 Frequency of micturition: Secondary | ICD-10-CM | POA: Insufficient documentation

## 2023-07-16 NOTE — Assessment & Plan Note (Signed)
 Lab Results  Component Value Date   CREATININE 0.90 07/13/2023   Stable overall, cont to avoid nephrotoxins

## 2023-07-16 NOTE — Assessment & Plan Note (Signed)
 Lab Results  Component Value Date   IRON 88 12/13/2019   FERRITIN 96.6 12/13/2019   Also for f/u lab, no reen overt bleeding

## 2023-07-16 NOTE — Assessment & Plan Note (Signed)
 Lab Results  Component Value Date   LDLCALC 73 07/13/2023   uncontrolled, pt to continue current statin lipitor 20 mg every day and lower chol diet, declines change for now

## 2023-07-16 NOTE — Assessment & Plan Note (Signed)
 Recent onset, can't r/o uti - for urine cx with labs

## 2023-07-16 NOTE — Assessment & Plan Note (Signed)
 Lab Results  Component Value Date   HGBA1C 6.9 (H) 07/13/2023   Stable, pt to continue current medical treatment farxiga 10 every day, ozempic 2 mg once weekly but ok for d/c metformin as has not taken recenlty

## 2023-07-16 NOTE — Assessment & Plan Note (Signed)
 Lab Results  Component Value Date   VITAMINB12 514 07/13/2023   Stable, cont oral replacement - b12 1000 mcg qd

## 2023-07-16 NOTE — Assessment & Plan Note (Signed)
 Last vitamin D Lab Results  Component Value Date   VD25OH 27.62 (L) 07/13/2023   Low, to start oral replacement

## 2023-07-16 NOTE — Assessment & Plan Note (Signed)
 Age and sex appropriate education and counseling updated with regular exercise and diet Referrals for preventative services  - declines colonoscopy for now Immunizations addressed - all immunizations for now Smoking counseling  - none needed Evidence for depression or other mood disorder - none significant Most recent labs reviewed. I have personally reviewed and have noted: 1) the patient's medical and social history 2) The patient's current medications and supplements 3) The patient's height, weight, and BMI have been recorded in the chart

## 2023-07-16 NOTE — Assessment & Plan Note (Signed)
 BP Readings from Last 3 Encounters:  07/13/23 126/78  02/08/23 130/82  01/14/23 122/80   Stable, pt to continue medical treatment norvasc 10 every day, lotensin 40 qd

## 2023-08-01 ENCOUNTER — Telehealth: Payer: Self-pay

## 2023-08-01 NOTE — Telephone Encounter (Signed)
Copied from CRM [id number]. Topic: Clinical - Prescription Issue >> Aug 01, 2023  2:12 PM Elizebeth Brooking wrote: Reason for CRM: Patient called in stating that she needs a prior authorization on her prescription  Semaglutide, 2 MG/DOSE, (OZEMPIC, 2 MG/DOSE,) 8 MG/3ML SOPN

## 2023-08-03 ENCOUNTER — Other Ambulatory Visit: Payer: Self-pay | Admitting: Internal Medicine

## 2023-08-03 NOTE — Telephone Encounter (Signed)
Copied from CRM [id number]. Topic: Clinical - Medication Refill >> Aug 03, 2023  3:26 PM Pascal Lux wrote: Most Recent Primary Care Visit:  Provider: Corwin Levins  Department: [facility] [place]  Visit Type: OFFICE VISIT  Date: 07/13/2023  Medication: Semaglutide, 2 MG/DOSE, (OZEMPIC, 2 MG/DOSE,) 8 MG/3ML SOPN [[id number]]  Has the patient contacted their pharmacy? Yes (Agent: If no, request that the patient contact the pharmacy for the refill. If patient does not wish to contact the pharmacy document the reason why and proceed with request.) (Agent: If yes, when and what did the pharmacy advise?) - stated to contact provider.  Is this the correct pharmacy for this prescription? Yes If no, delete pharmacy and type the correct one.  This is the patient's preferred pharmacy:  [facility] [address], [place] - [address] AT [address] [place] [address] Phone: [phone] Fax: [phone]   Has the prescription been filled recently? No  Is the patient out of the medication? Yes  Has the patient been seen for an appointment in the last year OR does the patient have an upcoming appointment? Yes  Can we respond through MyChart? Yes  Agent: Please be advised that Rx refills may take up to 3 business days. We ask that you follow-up with your pharmacy.

## 2023-08-04 ENCOUNTER — Other Ambulatory Visit: Payer: Self-pay

## 2023-08-04 MED ORDER — OZEMPIC (2 MG/DOSE) 8 MG/3ML ~~LOC~~ SOPN: 2.0000 mg | PEN_INJECTOR | SUBCUTANEOUS | 3 refills | Status: DC

## 2023-08-05 ENCOUNTER — Other Ambulatory Visit (HOSPITAL_COMMUNITY): Payer: Self-pay

## 2023-08-05 ENCOUNTER — Telehealth: Payer: Self-pay

## 2023-08-05 NOTE — Telephone Encounter (Signed)
Pharmacy Patient Advocate Encounter   Received notification from Pt Calls Messages that prior authorization for Ozempic 8mg /23ml is required/requested.   Insurance verification completed.   The patient is insured through [employer] .   Per test claim: Refill too soon. PA is not needed at this time. Medication was filled 08/05/23. Next eligible fill date is 08/30/23.  Prior authorization expires on 03/28/26 - maximum 1 fill in 25 days

## 2023-08-05 NOTE — Telephone Encounter (Signed)
 ERROR

## 2023-08-09 LAB — HM DIABETES EYE EXAM

## 2023-08-11 ENCOUNTER — Encounter: Payer: Self-pay | Admitting: Internal Medicine

## 2023-08-30 ENCOUNTER — Ambulatory Visit (INDEPENDENT_AMBULATORY_CARE_PROVIDER_SITE_OTHER): Payer: 59 | Admitting: Internal Medicine

## 2023-08-30 ENCOUNTER — Other Ambulatory Visit (HOSPITAL_COMMUNITY): Payer: Self-pay

## 2023-08-30 ENCOUNTER — Encounter: Payer: Self-pay | Admitting: Internal Medicine

## 2023-08-30 VITALS — BP 120/82 | HR 74 | Temp 98.0°F | Ht 67.0 in | Wt 194.4 lb

## 2023-08-30 DIAGNOSIS — E538 Deficiency of other specified B group vitamins: Secondary | ICD-10-CM

## 2023-08-30 DIAGNOSIS — I10 Essential (primary) hypertension: Secondary | ICD-10-CM

## 2023-08-30 DIAGNOSIS — E559 Vitamin D deficiency, unspecified: Secondary | ICD-10-CM | POA: Diagnosis not present

## 2023-08-30 DIAGNOSIS — R519 Headache, unspecified: Secondary | ICD-10-CM

## 2023-08-30 DIAGNOSIS — J309 Allergic rhinitis, unspecified: Secondary | ICD-10-CM

## 2023-08-30 MED ORDER — AMLODIPINE BESYLATE 10 MG PO TABS: ORAL_TABLET | ORAL | 3 refills | Status: DC

## 2023-08-30 MED ORDER — DAPAGLIFLOZIN PROPANEDIOL 10 MG PO TABS: 10.0000 mg | ORAL_TABLET | Freq: Every day | ORAL | 3 refills | Status: DC

## 2023-08-30 MED ORDER — RIZATRIPTAN BENZOATE 10 MG PO TBDP: 10.0000 mg | ORAL_TABLET | ORAL | 5 refills | Status: DC | PRN

## 2023-08-30 MED ORDER — BENAZEPRIL HCL 40 MG PO TABS: ORAL_TABLET | ORAL | 3 refills | Status: DC

## 2023-08-30 MED ORDER — PREDNISONE 10 MG PO TABS: ORAL_TABLET | ORAL | 0 refills | Status: DC

## 2023-08-30 NOTE — Patient Instructions (Signed)
 Please take all new medication as prescribed - the maxalt as needed for headache  Please take all new medication as prescribed - the prednisone  Please call in 1 week or after if not improved to consider MRI brain  Please continue all other medications as before, and refills have been done if requested.  Please have the pharmacy call with any other refills you may need.  Please keep your appointments with your specialists as you may have planned

## 2023-08-30 NOTE — Progress Notes (Unsigned)
 Patient ID: Jessica Daniels, female   DOB: [birth year], 63 y.o.   MRN: [id number]        Chief Complaint: follow up HTN, HLD and hyperglycemia ***       HPI:  Jessica Daniels is a 63 y.o. female here with c/o        Wt Readings from Last 3 Encounters:  08/30/23 194 lb 6.4 oz (88.2 kg)  07/13/23 196 lb (88.9 kg)  02/08/23 193 lb (87.5 kg)   BP Readings from Last 3 Encounters:  08/30/23 120/82  07/13/23 126/78  02/08/23 130/82         Past Medical History:  Diagnosis Date   Allergic rhinitis    Anemia    Anxiety    Depression    Diabetes mellitus    Hyperlipidemia    Hypertension    Past Surgical History:  Procedure Laterality Date   CHOLECYSTECTOMY     LUMBAR LAMINECTOMY      reports that she has never smoked. She has never used smokeless tobacco. She reports that she does not drink alcohol and does not use drugs. family history includes Diabetes in an other family member; Heart attack in her mother; Lung cancer in her father. Allergies  Allergen Reactions   Crestor [Rosuvastatin]     Elevated liver enzymes   Current Outpatient Medications on File Prior to Visit  Medication Sig Dispense Refill   amLODipine (NORVASC) 10 MG tablet TAKE 1 TABLET(10 MG) BY MOUTH DAILY 90 tablet 3   atorvastatin (LIPITOR) 20 MG tablet 1 tab by mouth once daily 90 tablet 2   benazepril (LOTENSIN) 40 MG tablet 1 Tablet by mouth daily. 90 tablet 3   dapagliflozin propanediol (FARXIGA) 10 MG TABS tablet Take 1 tablet (10 mg total) by mouth daily before breakfast. 90 tablet 3   meloxicam (MOBIC) 15 MG tablet Take 1 tablet (15 mg total) by mouth daily. 90 tablet 3   Semaglutide, 2 MG/DOSE, (OZEMPIC, 2 MG/DOSE,) 8 MG/3ML SOPN Inject 2 mg into the skin once a week. 9 mL 3   albuterol (VENTOLIN HFA) 108 (90 Base) MCG/ACT inhaler Inhale 2 puffs into the lungs every 6 (six) hours as needed for wheezing or shortness of breath. (Patient not taking: Reported on 08/30/2023) 8 g 1   aspirin EC 81 MG  tablet Take 1 tablet (81 mg total) by mouth daily. (Patient not taking: Reported on 08/30/2023) 90 tablet 11   cyclobenzaprine (FLEXERIL) 5 MG tablet Take 1 tablet (5 mg total) by mouth 3 (three) times daily as needed. (Patient not taking: Reported on 08/30/2023) 40 tablet 1   fluticasone (FLONASE) 50 MCG/ACT nasal spray Place 2 sprays into both nostrils daily. (Patient not taking: Reported on 08/30/2023) 16 g 6   glucose blood (ONETOUCH VERIO) test strip Use to check blood sugars twice a day (Patient not taking: Reported on 08/30/2023) 100 each 11   Multiple Vitamin (MULTI-VITAMIN) tablet Take 1 tablet by mouth daily. (Patient not taking: Reported on 08/30/2023)     ONETOUCH DELICA LANCETS 33G MISC Use to help check blood sugars twice a day (Patient not taking: Reported on 08/30/2023) 100 each 5   RESTASIS 0.05 % ophthalmic emulsion 1 drop 2 (two) times daily. (Patient not taking: Reported on 08/30/2023)     No current facility-administered medications on file prior to visit.        ROS:  All others reviewed and negative.  Objective        PE:  BP  120/82   Pulse 74   Temp 98 F (36.7 C)   Ht 5\' 7"  (1.702 m)   Wt 194 lb 6.4 oz (88.2 kg)   LMP 08/06/2015   SpO2 99%   BMI 30.45 kg/m                 Constitutional: Pt appears in NAD               HENT: Head: NCAT.                Right Ear: External ear normal.                 Left Ear: External ear normal.                Eyes: . Pupils are equal, round, and reactive to light. Conjunctivae and EOM are normal               Nose: without d/c or deformity               Neck: Neck supple. Gross normal ROM               Cardiovascular: Normal rate and regular rhythm.                 Pulmonary/Chest: Effort normal and breath sounds without rales or wheezing.                Abd:  Soft, NT, ND, + BS, no organomegaly               Neurological: Pt is alert. At baseline orientation, motor grossly intact               Skin: Skin is warm. No rashes, no  other new lesions, LE edema - ***               Psychiatric: Pt behavior is normal without agitation   Micro: none  Cardiac tracings I have personally interpreted today:  none  Pertinent Radiological findings (summarize): none   Lab Results  Component Value Date   WBC 6.5 07/13/2023   HGB 12.9 07/13/2023   HCT 41.0 07/13/2023   PLT 326.0 07/13/2023   GLUCOSE 100 (H) 07/13/2023   CHOL 156 07/13/2023   TRIG 83.0 07/13/2023   HDL 65.50 07/13/2023   LDLDIRECT 137.4 10/20/2009   LDLCALC 73 07/13/2023   ALT 19 07/13/2023   AST 20 07/13/2023   NA 142 07/13/2023   K 4.1 07/13/2023   CL 103 07/13/2023   CREATININE 0.90 07/13/2023   BUN 11 07/13/2023   CO2 27 07/13/2023   TSH 2.28 07/13/2023   HGBA1C 6.9 (H) 07/13/2023   MICROALBUR <0.7 07/13/2023   Assessment/Plan:  Jessica Daniels is a 63 y.o. Black or African American [2] female with  has a past medical history of Allergic rhinitis, Anemia, Anxiety, Depression, Diabetes mellitus, Hyperlipidemia, and Hypertension.  No problem-specific Assessment & Plan notes found for this encounter.  Followup: No follow-ups on file.  Oliver Barre, MD 08/30/2023 2:59 PM [facility] [place] [facility] - [facility] Internal Medicine

## 2023-09-01 ENCOUNTER — Encounter: Payer: Self-pay | Admitting: Internal Medicine

## 2023-09-01 NOTE — Assessment & Plan Note (Signed)
 Lab Results  Component Value Date   VITAMINB12 514 07/13/2023   Stable, cont oral replacement - b12 1000 mcg qd

## 2023-09-01 NOTE — Assessment & Plan Note (Addendum)
 Exam c/w probable atypical migraine - for maxalt mlt prn, consider MRI if not improved

## 2023-09-01 NOTE — Assessment & Plan Note (Signed)
 BP Readings from Last 3 Encounters:  08/30/23 120/82  07/13/23 126/78  02/08/23 130/82   Stable, pt to continue medical treatment norvasc 10 every day, lotension 40 qd

## 2023-09-01 NOTE — Assessment & Plan Note (Signed)
Mild to mod, for prednisone taper asd, to f/u any worsening symptoms or concerns

## 2023-09-01 NOTE — Assessment & Plan Note (Signed)
 Last vitamin D Lab Results  Component Value Date   VD25OH 27.62 (L) 07/13/2023   Low, to start oral replacement

## 2023-11-17 ENCOUNTER — Encounter: Payer: Self-pay | Admitting: Physical Medicine and Rehabilitation

## 2023-11-17 ENCOUNTER — Other Ambulatory Visit: Payer: Self-pay | Admitting: Physical Medicine and Rehabilitation

## 2023-11-17 DIAGNOSIS — M545 Low back pain, unspecified: Secondary | ICD-10-CM

## 2023-11-17 DIAGNOSIS — M79604 Pain in right leg: Secondary | ICD-10-CM

## 2023-11-21 ENCOUNTER — Encounter: Payer: Self-pay | Admitting: Physical Medicine and Rehabilitation

## 2023-12-02 ENCOUNTER — Ambulatory Visit
Admission: RE | Admit: 2023-12-02 | Discharge: 2023-12-02 | Disposition: A | Discharge status: Home / Self Care | Source: Ambulatory Visit | Attending: Physical Medicine and Rehabilitation | Admitting: Physical Medicine and Rehabilitation

## 2023-12-02 DIAGNOSIS — M79604 Pain in right leg: Secondary | ICD-10-CM

## 2023-12-02 DIAGNOSIS — M545 Low back pain, unspecified: Secondary | ICD-10-CM

## 2023-12-29 ENCOUNTER — Encounter: Payer: Self-pay | Admitting: Internal Medicine

## 2023-12-29 ENCOUNTER — Ambulatory Visit: Admitting: Internal Medicine

## 2023-12-29 VITALS — BP 120/80 | HR 69 | Ht 67.0 in | Wt 202.0 lb

## 2023-12-29 DIAGNOSIS — E559 Vitamin D deficiency, unspecified: Secondary | ICD-10-CM | POA: Diagnosis not present

## 2023-12-29 DIAGNOSIS — R058 Other specified cough: Secondary | ICD-10-CM

## 2023-12-29 DIAGNOSIS — E1165 Type 2 diabetes mellitus with hyperglycemia: Secondary | ICD-10-CM | POA: Diagnosis not present

## 2023-12-29 DIAGNOSIS — Z7984 Long term (current) use of oral hypoglycemic drugs: Secondary | ICD-10-CM

## 2023-12-29 DIAGNOSIS — E538 Deficiency of other specified B group vitamins: Secondary | ICD-10-CM

## 2023-12-29 DIAGNOSIS — I10 Essential (primary) hypertension: Secondary | ICD-10-CM | POA: Diagnosis not present

## 2023-12-29 DIAGNOSIS — Z7985 Long-term (current) use of injectable non-insulin antidiabetic drugs: Secondary | ICD-10-CM

## 2023-12-29 LAB — POCT INFLUENZA A/B
Influenza A, POC: NEGATIVE
Influenza B, POC: NEGATIVE

## 2023-12-29 LAB — POC COVID19 BINAXNOW: SARS Coronavirus 2 Ag: NEGATIVE

## 2023-12-29 MED ORDER — LEVOFLOXACIN 500 MG PO TABS: 500.0000 mg | ORAL_TABLET | Freq: Every day | ORAL | 0 refills | Status: DC

## 2023-12-29 MED ORDER — HYDROCODONE BIT-HOMATROP MBR 5-1.5 MG/5ML PO SOLN: 5.0000 mL | Freq: Four times a day (QID) | ORAL | 0 refills | Status: AC | PRN

## 2023-12-29 NOTE — Progress Notes (Signed)
 Patient ID: Jessica Daniels, female   DOB: Oct 28, 1960, 63 y.o.   MRN: [id number]        Chief Complaint: follow up prod cough, dm, htn, low b12 and D       HPI:  Jessica Daniels is a 63 y.o. female Here with acute onset mild to mod 2-3 days ST, HA, general weakness and malaise, with prod cough greenish sputum, but Pt denies chest pain, increased sob or doe, wheezing, orthopnea, PND, increased LE swelling, palpitations, dizziness or syncope.   Pt denies polydipsia, polyuria, or new focal neuro s/s.    Pt denies recent wt loss, night sweats, loss of appetite, or other constitutional symptoms        Wt Readings from Last 3 Encounters:  12/29/23 202 lb (91.6 kg)  08/30/23 194 lb 6.4 oz (88.2 kg)  07/13/23 196 lb (88.9 kg)   BP Readings from Last 3 Encounters:  12/29/23 120/80  08/30/23 120/82  07/13/23 126/78         Past Medical History:  Diagnosis Date   Allergic rhinitis    Anemia    Anxiety    Depression    Diabetes mellitus    Hyperlipidemia    Hypertension    Past Surgical History:  Procedure Laterality Date   CHOLECYSTECTOMY     LUMBAR LAMINECTOMY      reports that she has never smoked. She has never used smokeless tobacco. She reports that she does not drink alcohol and does not use drugs. family history includes Diabetes in an other family member; Heart attack in her mother; Lung cancer in her father. Allergies  Allergen Reactions   Crestor [Rosuvastatin]     Elevated liver enzymes   Current Outpatient Medications on File Prior to Visit  Medication Sig Dispense Refill   amLODipine  (NORVASC ) 10 MG tablet TAKE 1 TABLET(10 MG) BY MOUTH DAILY 90 tablet 3   atorvastatin  (LIPITOR) 20 MG tablet 1 tab by mouth once daily 90 tablet 2   benazepril  (LOTENSIN ) 40 MG tablet 1 Tablet by mouth daily. 90 tablet 3   dapagliflozin  propanediol (FARXIGA ) 10 MG TABS tablet Take 1 tablet (10 mg total) by mouth daily before breakfast. 90 tablet 3   predniSONE  (DELTASONE ) 10 MG tablet  3 tabs by mouth per day for 3 days,2tabs per day for 3 days,1tab per day for 3 days 18 tablet 0   rizatriptan  (MAXALT -MLT) 10 MG disintegrating tablet Take 1 tablet (10 mg total) by mouth as needed for migraine. May repeat in 2 hours if needed 10 tablet 5   Semaglutide , 2 MG/DOSE, (OZEMPIC , 2 MG/DOSE,) 8 MG/3ML SOPN Inject 2 mg into the skin once a week. 9 mL 3   albuterol  (VENTOLIN  HFA) 108 (90 Base) MCG/ACT inhaler Inhale 2 puffs into the lungs every 6 (six) hours as needed for wheezing or shortness of breath. (Patient not taking: Reported on 12/29/2023) 8 g 1   aspirin  EC 81 MG tablet Take 1 tablet (81 mg total) by mouth daily. (Patient not taking: Reported on 12/29/2023) 90 tablet 11   cyclobenzaprine  (FLEXERIL ) 5 MG tablet Take 1 tablet (5 mg total) by mouth 3 (three) times daily as needed. (Patient not taking: Reported on 12/29/2023) 40 tablet 1   fluticasone  (FLONASE ) 50 MCG/ACT nasal spray Place 2 sprays into both nostrils daily. (Patient not taking: Reported on 12/29/2023) 16 g 6   glucose blood (ONETOUCH VERIO) test strip Use to check blood sugars twice a day (Patient not taking: Reported on  12/29/2023) 100 each 11   meloxicam  (MOBIC ) 15 MG tablet Take 1 tablet (15 mg total) by mouth daily. (Patient not taking: Reported on 12/29/2023) 90 tablet 3   Multiple Vitamin (MULTI-VITAMIN) tablet Take 1 tablet by mouth daily. (Patient not taking: Reported on 12/29/2023)     ONETOUCH DELICA LANCETS 33G MISC Use to help check blood sugars twice a day (Patient not taking: Reported on 12/29/2023) 100 each 5   RESTASIS 0.05 % ophthalmic emulsion 1 drop 2 (two) times daily. (Patient not taking: Reported on 12/29/2023)     No current facility-administered medications on file prior to visit.        ROS:  All others reviewed and negative.  Objective        PE:  BP 120/80 (BP Location: Left Arm, Patient Position: Sitting, Cuff Size: Normal)   Pulse 69   Ht 5' 7 (1.702 m)   Wt 202 lb (91.6 kg)   LMP 08/06/2015    SpO2 97%   BMI 31.64 kg/m                 Constitutional: Pt appears in NAD               HENT: Head: NCAT.                Right Ear: External ear normal.                 Left Ear: External ear normal.                Eyes: . Pupils are equal, round, and reactive to light. Conjunctivae and EOM are normal               Nose: without d/c or deformity               Neck: Neck supple. Gross normal ROM               Cardiovascular: Normal rate and regular rhythm.                 Pulmonary/Chest: Effort normal and breath sounds without rales or wheezing.                Abd:  Soft, NT, ND, + BS, no organomegaly               Neurological: Pt is alert. At baseline orientation, motor grossly intact               Skin: Skin is warm. No rashes, no other new lesions, LE edema - none               Psychiatric: Pt behavior is normal without agitation   Micro: none  Cardiac tracings I have personally interpreted today:  none  Pertinent Radiological findings (summarize): none   Lab Results  Component Value Date   WBC 6.5 07/13/2023   HGB 12.9 07/13/2023   HCT 41.0 07/13/2023   PLT 326.0 07/13/2023   GLUCOSE 100 (H) 07/13/2023   CHOL 156 07/13/2023   TRIG 83.0 07/13/2023   HDL 65.50 07/13/2023   LDLDIRECT 137.4 10/20/2009   LDLCALC 73 07/13/2023   ALT 19 07/13/2023   AST 20 07/13/2023   NA 142 07/13/2023   K 4.1 07/13/2023   CL 103 07/13/2023   CREATININE 0.90 07/13/2023   BUN 11 07/13/2023   CO2 27 07/13/2023   TSH 2.28 07/13/2023   HGBA1C 6.9 (H) 07/13/2023   MICROALBUR <0.7  07/13/2023   POCT - covid and flu negative  Assessment/Plan:  Jessica Daniels is a 63 y.o. Black or African American [2] female with  has a past medical history of Allergic rhinitis, Anemia, Anxiety, Depression, Diabetes mellitus, Hyperlipidemia, and Hypertension.  Productive cough Mild to mod, cw bronchitis vs pna, for antibx course, cough med prn,  to f/u any worsening symptoms or concerns  Vitamin  D deficiency Last vitamin D  Lab Results  Component Value Date   VD25OH 27.62 (L) 07/13/2023   Low, to start oral replacement   Essential hypertension BP Readings from Last 3 Encounters:  12/29/23 120/80  08/30/23 120/82  07/13/23 126/78   Stable, pt to continue medical treatment norvasc  10 every day, lotensin  40 qd   Diabetes Lab Results  Component Value Date   HGBA1C 6.9 (H) 07/13/2023   Stable, pt to continue current medical treatment farxiga  10 mg every day, ozempic  2 mg weekly   B12 deficiency Lab Results  Component Value Date   VITAMINB12 514 07/13/2023   Stable, cont oral replacement - b12 1000 mcg qd  Followup: Return if symptoms worsen or fail to improve.  Lynwood Rush, MD 12/31/2023 8:22 PM [facility] [place] [facility]

## 2023-12-29 NOTE — Patient Instructions (Signed)
 Your testing for Covid and Flu were negative  ,Please take all new medication as prescribed  - the antibiotic, and cough medicine as needed  Please continue all other medications as before  Please have the pharmacy call with any other refills you may need.  Please keep your appointments with your specialists as you may have planned

## 2023-12-31 ENCOUNTER — Encounter: Payer: Self-pay | Admitting: Internal Medicine

## 2023-12-31 NOTE — Assessment & Plan Note (Signed)
 Lab Results  Component Value Date   HGBA1C 6.9 (H) 07/13/2023   Stable, pt to continue current medical treatment farxiga  10 mg every day, ozempic  2 mg weekly

## 2023-12-31 NOTE — Assessment & Plan Note (Signed)
Mild to mod, c/w bronchitis vs pna, for antibx course, cough med prn,  to f/u any worsening symptoms or concerns 

## 2023-12-31 NOTE — Assessment & Plan Note (Signed)
 BP Readings from Last 3 Encounters:  12/29/23 120/80  08/30/23 120/82  07/13/23 126/78   Stable, pt to continue medical treatment norvasc  10 every day, lotensin  40 qd

## 2023-12-31 NOTE — Assessment & Plan Note (Signed)
 Lab Results  Component Value Date   VITAMINB12 514 07/13/2023   Stable, cont oral replacement - b12 1000 mcg qd

## 2023-12-31 NOTE — Assessment & Plan Note (Signed)
 Last vitamin D Lab Results  Component Value Date   VD25OH 27.62 (L) 07/13/2023   Low, to start oral replacement

## 2024-01-10 ENCOUNTER — Ambulatory Visit (INDEPENDENT_AMBULATORY_CARE_PROVIDER_SITE_OTHER): Payer: 59 | Admitting: Internal Medicine

## 2024-01-10 ENCOUNTER — Ambulatory Visit: Payer: Self-pay | Admitting: Internal Medicine

## 2024-01-10 ENCOUNTER — Encounter: Payer: Self-pay | Admitting: Internal Medicine

## 2024-01-10 VITALS — BP 126/88 | HR 70 | Temp 98.1°F | Ht 67.0 in | Wt 200.6 lb

## 2024-01-10 DIAGNOSIS — E559 Vitamin D deficiency, unspecified: Secondary | ICD-10-CM

## 2024-01-10 DIAGNOSIS — Z7984 Long term (current) use of oral hypoglycemic drugs: Secondary | ICD-10-CM

## 2024-01-10 DIAGNOSIS — I10 Essential (primary) hypertension: Secondary | ICD-10-CM

## 2024-01-10 DIAGNOSIS — E7800 Pure hypercholesterolemia, unspecified: Secondary | ICD-10-CM | POA: Diagnosis not present

## 2024-01-10 DIAGNOSIS — Z7985 Long-term (current) use of injectable non-insulin antidiabetic drugs: Secondary | ICD-10-CM

## 2024-01-10 DIAGNOSIS — E1165 Type 2 diabetes mellitus with hyperglycemia: Secondary | ICD-10-CM

## 2024-01-10 LAB — LIPID PANEL
Cholesterol: 159 mg/dL (ref 0–200)
HDL: 61.5 mg/dL (ref >39.00)
LDL Cholesterol: 79 mg/dL (ref 0–99)
NonHDL: 97.55
Total CHOL/HDL Ratio: 3
Triglycerides: 93 mg/dL (ref 0.0–149.0)
VLDL: 18.6 mg/dL (ref 0.0–40.0)

## 2024-01-10 LAB — HEPATIC FUNCTION PANEL
ALT: 14 U/L (ref 0–35)
AST: 17 U/L (ref 0–37)
Albumin: 4.3 g/dL (ref 3.5–5.2)
Alkaline Phosphatase: 103 U/L (ref 39–117)
Bilirubin, Direct: 0 mg/dL (ref 0.0–0.3)
Total Bilirubin: 0.3 mg/dL (ref 0.2–1.2)
Total Protein: 7.3 g/dL (ref 6.0–8.3)

## 2024-01-10 LAB — HEMOGLOBIN A1C: Hgb A1c MFr Bld: 7 % — ABNORMAL HIGH (ref 4.6–6.5)

## 2024-01-10 LAB — BASIC METABOLIC PANEL WITH GFR
BUN: 18 mg/dL (ref 6–23)
CO2: 30 meq/L (ref 19–32)
Calcium: 9.8 mg/dL (ref 8.4–10.5)
Chloride: 104 meq/L (ref 96–112)
Creatinine, Ser: 1.05 mg/dL (ref 0.40–1.20)
GFR: 57.45 mL/min — ABNORMAL LOW (ref >60.00)
Glucose, Bld: 114 mg/dL — ABNORMAL HIGH (ref 70–99)
Potassium: 3.9 meq/L (ref 3.5–5.1)
Sodium: 140 meq/L (ref 135–145)

## 2024-01-10 LAB — VITAMIN D 25 HYDROXY (VIT D DEFICIENCY, FRACTURES): VITD: 29.99 ng/mL — ABNORMAL LOW (ref 30.00–100.00)

## 2024-01-10 MED ORDER — RIZATRIPTAN BENZOATE 10 MG PO TBDP: 10.0000 mg | ORAL_TABLET | ORAL | 5 refills | Status: DC | PRN

## 2024-01-10 MED ORDER — BENAZEPRIL HCL 40 MG PO TABS: ORAL_TABLET | ORAL | 3 refills | Status: AC

## 2024-01-10 MED ORDER — OZEMPIC (2 MG/DOSE) 8 MG/3ML ~~LOC~~ SOPN: 2.0000 mg | PEN_INJECTOR | SUBCUTANEOUS | 3 refills | Status: AC

## 2024-01-10 MED ORDER — ATORVASTATIN CALCIUM 20 MG PO TABS: ORAL_TABLET | ORAL | 3 refills | Status: AC

## 2024-01-10 MED ORDER — AMLODIPINE BESYLATE 10 MG PO TABS: ORAL_TABLET | ORAL | 3 refills | Status: AC

## 2024-01-10 MED ORDER — DAPAGLIFLOZIN PROPANEDIOL 10 MG PO TABS: 10.0000 mg | ORAL_TABLET | Freq: Every day | ORAL | 3 refills | Status: AC

## 2024-01-10 NOTE — Patient Instructions (Signed)

## 2024-01-10 NOTE — Progress Notes (Unsigned)
 Patient ID: Jessica Daniels, female   DOB: 03-Dec-1960, 63 y.o.   MRN: [id number]        Chief Complaint: follow up HTN, HLD and hyperglycemia ***       HPI:  Jessica Daniels is a 63 y.o. female here with c/o        Still some few cough from last visit illness. Peak wt has up to 240 in past.   Wt Readings from Last 3 Encounters:  01/10/24 200 lb 9.6 oz (91 kg)  12/29/23 202 lb (91.6 kg)  08/30/23 194 lb 6.4 oz (88.2 kg)   BP Readings from Last 3 Encounters:  01/10/24 126/88  12/29/23 120/80  08/30/23 120/82         Past Medical History:  Diagnosis Date   Allergic rhinitis    Anemia    Anxiety    Arthritis    Depression    Diabetes mellitus    Hyperlipidemia    Hypertension    Past Surgical History:  Procedure Laterality Date   CHOLECYSTECTOMY     LUMBAR LAMINECTOMY      reports that she has never smoked. She has never used smokeless tobacco. She reports that she does not drink alcohol and does not use drugs. family history includes Arthritis in her maternal grandmother; Cancer in her maternal aunt; Diabetes in her maternal aunt and another family member; Heart attack in her mother; Hypertension in her mother; Kidney disease in her mother; Lung cancer in her father. Allergies  Allergen Reactions   Crestor [Rosuvastatin]     Elevated liver enzymes   Current Outpatient Medications on File Prior to Visit  Medication Sig Dispense Refill   amLODipine  (NORVASC ) 10 MG tablet TAKE 1 TABLET(10 MG) BY MOUTH DAILY 90 tablet 3   atorvastatin  (LIPITOR) 20 MG tablet 1 tab by mouth once daily 90 tablet 2   benazepril  (LOTENSIN ) 40 MG tablet 1 Tablet by mouth daily. 90 tablet 3   dapagliflozin  propanediol (FARXIGA ) 10 MG TABS tablet Take 1 tablet (10 mg total) by mouth daily before breakfast. 90 tablet 3   predniSONE  (DELTASONE ) 10 MG tablet 3 tabs by mouth per day for 3 days,2tabs per day for 3 days,1tab per day for 3 days 18 tablet 0   rizatriptan  (MAXALT -MLT) 10 MG  disintegrating tablet Take 1 tablet (10 mg total) by mouth as needed for migraine. May repeat in 2 hours if needed 10 tablet 5   Semaglutide , 2 MG/DOSE, (OZEMPIC , 2 MG/DOSE,) 8 MG/3ML SOPN Inject 2 mg into the skin once a week. 9 mL 3   albuterol  (VENTOLIN  HFA) 108 (90 Base) MCG/ACT inhaler Inhale 2 puffs into the lungs every 6 (six) hours as needed for wheezing or shortness of breath. (Patient not taking: Reported on 01/10/2024) 8 g 1   aspirin  EC 81 MG tablet Take 1 tablet (81 mg total) by mouth daily. (Patient not taking: Reported on 01/10/2024) 90 tablet 11   cyclobenzaprine  (FLEXERIL ) 5 MG tablet Take 1 tablet (5 mg total) by mouth 3 (three) times daily as needed. (Patient not taking: Reported on 01/10/2024) 40 tablet 1   fluticasone  (FLONASE ) 50 MCG/ACT nasal spray Place 2 sprays into both nostrils daily. (Patient not taking: Reported on 01/10/2024) 16 g 6   glucose blood (ONETOUCH VERIO) test strip Use to check blood sugars twice a day (Patient not taking: Reported on 01/10/2024) 100 each 11   levofloxacin  (LEVAQUIN ) 500 MG tablet Take 1 tablet (500 mg total) by mouth daily. (Patient  not taking: Reported on 01/10/2024) 10 tablet 0   meloxicam  (MOBIC ) 15 MG tablet Take 1 tablet (15 mg total) by mouth daily. (Patient not taking: Reported on 01/10/2024) 90 tablet 3   Multiple Vitamin (MULTI-VITAMIN) tablet Take 1 tablet by mouth daily. (Patient not taking: Reported on 01/10/2024)     ONETOUCH DELICA LANCETS 33G MISC Use to help check blood sugars twice a day (Patient not taking: Reported on 01/10/2024) 100 each 5   RESTASIS 0.05 % ophthalmic emulsion 1 drop 2 (two) times daily. (Patient not taking: Reported on 01/10/2024)     No current facility-administered medications on file prior to visit.        ROS:  All others reviewed and negative.  Objective        PE:  BP 126/88   Pulse 70   Temp 98.1 F (36.7 C)   Ht 5' 7 (1.702 m)   Wt 200 lb 9.6 oz (91 kg)   LMP 08/06/2015   SpO2 97%   BMI 31.42 kg/m                  Constitutional: Pt appears in NAD               HENT: Head: NCAT.                Right Ear: External ear normal.                 Left Ear: External ear normal.                Eyes: . Pupils are equal, round, and reactive to light. Conjunctivae and EOM are normal               Nose: without d/c or deformity               Neck: Neck supple. Gross normal ROM               Cardiovascular: Normal rate and regular rhythm.                 Pulmonary/Chest: Effort normal and breath sounds without rales or wheezing.                Abd:  Soft, NT, ND, + BS, no organomegaly               Neurological: Pt is alert. At baseline orientation, motor grossly intact               Skin: Skin is warm. No rashes, no other new lesions, LE edema - ***               Psychiatric: Pt behavior is normal without agitation   Micro: none  Cardiac tracings I have personally interpreted today:  none  Pertinent Radiological findings (summarize): none   Lab Results  Component Value Date   WBC 6.5 07/13/2023   HGB 12.9 07/13/2023   HCT 41.0 07/13/2023   PLT 326.0 07/13/2023   GLUCOSE 100 (H) 07/13/2023   CHOL 156 07/13/2023   TRIG 83.0 07/13/2023   HDL 65.50 07/13/2023   LDLDIRECT 137.4 10/20/2009   LDLCALC 73 07/13/2023   ALT 19 07/13/2023   AST 20 07/13/2023   NA 142 07/13/2023   K 4.1 07/13/2023   CL 103 07/13/2023   CREATININE 0.90 07/13/2023   BUN 11 07/13/2023   CO2 27 07/13/2023   TSH 2.28 07/13/2023   HGBA1C 6.9 (H) 07/13/2023  MICROALBUR 10.2 (H) 01/30/2010   Assessment/Plan:  Jessica Daniels is a 63 y.o. Black or African American [2] female with  has a past medical history of Allergic rhinitis, Anemia, Anxiety, Arthritis, Depression, Diabetes mellitus, Hyperlipidemia, and Hypertension.  No problem-specific Assessment & Plan notes found for this encounter.  Followup: No follow-ups on file.  Lynwood Rush, MD 01/10/2024 9:10 AM [facility]

## 2024-01-10 NOTE — Progress Notes (Signed)
 The test results show that your current treatment is OK, as the tests are stable.  Please continue the same plan.  There is no other need for change of treatment or further evaluation based on these results, at this time.  thanks

## 2024-01-12 ENCOUNTER — Encounter: Payer: Self-pay | Admitting: Internal Medicine

## 2024-01-12 NOTE — Assessment & Plan Note (Signed)
 BP Readings from Last 3 Encounters:  01/10/24 126/88  12/29/23 120/80  08/30/23 120/82   Stable, pt to continue medical treatment norvasc  10 every day, lotensin  40 mg qd

## 2024-01-12 NOTE — Assessment & Plan Note (Signed)
 Last vitamin D  Lab Results  Component Value Date   VD25OH 29.99 (L) 01/10/2024   Low, to start oral replacement

## 2024-01-12 NOTE — Assessment & Plan Note (Signed)
 Lab Results  Component Value Date   LDLCALC 79 01/10/2024   Uncontrolled,, pt to continue current statin liptiro 20 mg every day and lower chol diet, declines change for now

## 2024-01-12 NOTE — Assessment & Plan Note (Signed)
 Lab Results  Component Value Date   HGBA1C 7.0 (H) 01/10/2024   Stable, pt to continue current medical treatment farxiga  10 every day, ozempic  2 mg weekly

## 2024-02-01 LAB — HM MAMMOGRAPHY

## 2024-03-19 ENCOUNTER — Ambulatory Visit: Admitting: Internal Medicine

## 2024-03-19 ENCOUNTER — Ambulatory Visit: Admission: EM | Admit: 2024-03-19 | Discharge: 2024-03-19 | Discharge status: Against Medical Advice

## 2024-03-19 VITALS — HR 79 | Temp 97.9°F | Ht 67.0 in | Wt 207.0 lb

## 2024-03-19 DIAGNOSIS — R062 Wheezing: Secondary | ICD-10-CM

## 2024-03-19 DIAGNOSIS — I10 Essential (primary) hypertension: Secondary | ICD-10-CM

## 2024-03-19 DIAGNOSIS — E559 Vitamin D deficiency, unspecified: Secondary | ICD-10-CM | POA: Diagnosis not present

## 2024-03-19 DIAGNOSIS — Z7985 Long-term (current) use of injectable non-insulin antidiabetic drugs: Secondary | ICD-10-CM

## 2024-03-19 DIAGNOSIS — J069 Acute upper respiratory infection, unspecified: Secondary | ICD-10-CM | POA: Diagnosis not present

## 2024-03-19 DIAGNOSIS — E1165 Type 2 diabetes mellitus with hyperglycemia: Secondary | ICD-10-CM | POA: Diagnosis not present

## 2024-03-19 MED ORDER — PREDNISONE 10 MG PO TABS: ORAL_TABLET | ORAL | 0 refills | Status: DC

## 2024-03-19 MED ORDER — AZITHROMYCIN 250 MG PO TABS: ORAL_TABLET | ORAL | 1 refills | Status: AC

## 2024-03-19 MED ORDER — HYDROCODONE BIT-HOMATROP MBR 5-1.5 MG/5ML PO SOLN: 5.0000 mL | Freq: Four times a day (QID) | ORAL | 0 refills | Status: AC | PRN

## 2024-03-19 NOTE — Patient Instructions (Signed)
 Please take all new medication as prescribed - the antibiotic, cough medicine and prednisone   Please continue all other medications as before, including the inhaler as needed  Please have the pharmacy call with any other refills you may need.  Please continue your efforts at being more active, low cholesterol diet, and weight control..  Please keep your appointments with your specialists as you may have planned

## 2024-03-19 NOTE — ED Notes (Signed)
 Pt called for triage- States she was able to get an appt with her doctor so is going to see them instead.

## 2024-03-19 NOTE — Assessment & Plan Note (Signed)
 BP Readings from Last 3 Encounters:  01/10/24 126/88  12/29/23 120/80  08/30/23 120/82   Stable, pt to continue medical treatment norvasc  10 every day, lotensin  40 qd

## 2024-03-19 NOTE — Progress Notes (Signed)
 Patient ID: Jessica Daniels, female   DOB: [birth year], 63 y.o.   MRN: [id number]        Chief Complaint: follow up cough, wheezing, dm, htn, low vit d       HPI:  Jessica Daniels is a 63 y.o. female Here with acute onset mild to mod 2-3 days ST, HA, general weakness and malaise, with prod cough greenish sputum, but Pt denies chest pain, increased sob or doe, wheezing, orthopnea, PND, increased LE swelling, palpitations, dizziness or syncope, except for onset mild wheezing sob doe since yesterday.   Pt denies polydipsia, polyuria, or new focal neuro s/s. CBGs in lower 100s.         Wt Readings from Last 3 Encounters:  03/19/24 207 lb (93.9 kg)  01/10/24 200 lb 9.6 oz (91 kg)  12/29/23 202 lb (91.6 kg)   BP Readings from Last 3 Encounters:  01/10/24 126/88  12/29/23 120/80  08/30/23 120/82         Past Medical History:  Diagnosis Date   Allergic rhinitis    Anemia    Anxiety    Arthritis    Depression    Diabetes mellitus    Hyperlipidemia    Hypertension    Past Surgical History:  Procedure Laterality Date   CHOLECYSTECTOMY     LUMBAR LAMINECTOMY      reports that she has never smoked. She has never used smokeless tobacco. She reports that she does not drink alcohol and does not use drugs. family history includes Arthritis in her maternal grandmother; Cancer in her maternal aunt; Diabetes in her maternal aunt and another family member; Heart attack in her mother; Hypertension in her mother; Kidney disease in her mother; Lung cancer in her father. Allergies  Allergen Reactions   Crestor [Rosuvastatin]     Elevated liver enzymes   Current Outpatient Medications on File Prior to Visit  Medication Sig Dispense Refill   amLODipine  (NORVASC ) 10 MG tablet TAKE 1 TABLET(10 MG) BY MOUTH DAILY 90 tablet 3   atorvastatin  (LIPITOR) 20 MG tablet 1 tab by mouth once daily 90 tablet 3   benazepril  (LOTENSIN ) 40 MG tablet 1 Tablet by mouth daily. 90 tablet 3   dapagliflozin   propanediol (FARXIGA ) 10 MG TABS tablet Take 1 tablet (10 mg total) by mouth daily before breakfast. 90 tablet 3   fluticasone  (FLONASE ) 50 MCG/ACT nasal spray Place 2 sprays into both nostrils daily. 16 g 6   rizatriptan  (MAXALT -MLT) 10 MG disintegrating tablet Take 1 tablet (10 mg total) by mouth as needed for migraine. May repeat in 2 hours if needed 10 tablet 5   Semaglutide , 2 MG/DOSE, (OZEMPIC , 2 MG/DOSE,) 8 MG/3ML SOPN Inject 2 mg into the skin once a week. 9 mL 3   albuterol  (VENTOLIN  HFA) 108 (90 Base) MCG/ACT inhaler Inhale 2 puffs into the lungs every 6 (six) hours as needed for wheezing or shortness of breath. (Patient not taking: Reported on 03/19/2024) 8 g 1   aspirin  EC 81 MG tablet Take 1 tablet (81 mg total) by mouth daily. (Patient not taking: Reported on 03/19/2024) 90 tablet 11   cyclobenzaprine  (FLEXERIL ) 5 MG tablet Take 1 tablet (5 mg total) by mouth 3 (three) times daily as needed. (Patient not taking: Reported on 03/19/2024) 40 tablet 1   glucose blood (ONETOUCH VERIO) test strip Use to check blood sugars twice a day (Patient not taking: Reported on 03/19/2024) 100 each 11   meloxicam  (MOBIC ) 15 MG tablet Take 1  tablet (15 mg total) by mouth daily. (Patient not taking: Reported on 03/19/2024) 90 tablet 3   Multiple Vitamin (MULTI-VITAMIN) tablet Take 1 tablet by mouth daily. (Patient not taking: Reported on 03/19/2024)     ONETOUCH DELICA LANCETS 33G MISC Use to help check blood sugars twice a day (Patient not taking: Reported on 03/19/2024) 100 each 5   RESTASIS 0.05 % ophthalmic emulsion 1 drop 2 (two) times daily. (Patient not taking: Reported on 03/19/2024)     No current facility-administered medications on file prior to visit.        ROS:  All others reviewed and negative.  Objective        PE:  Pulse 79   Temp 97.9 F (36.6 C)   Ht 5' 7 (1.702 m)   Wt 207 lb (93.9 kg)   LMP 08/06/2015   SpO2 99%   BMI 32.42 kg/m                 Constitutional: Pt appears mild  ill               HENT: Head: NCAT.                Right Ear: External ear normal.                 Left Ear: External ear normal. ,Bilat tm's with mild erythema.  Max sinus areas mild tender.  Pharynx with mild erythema, no exudate               Eyes: . Pupils are equal, round, and reactive to light. Conjunctivae and EOM are normal               Nose: without d/c or deformity               Neck: Neck supple. Gross normal ROM               Cardiovascular: Normal rate and regular rhythm.                 Pulmonary/Chest: Effort normal and breath sounds decreased  without rales but with few bilateral wheezing.                               Neurological: Pt is alert. At baseline orientation, motor grossly intact               Skin: Skin is warm. No rashes, no other new lesions, LE edema - none               Psychiatric: Pt behavior is normal without agitation   Micro: none  Cardiac tracings I have personally interpreted today:  none  Pertinent Radiological findings (summarize): none   Lab Results  Component Value Date   WBC 6.5 07/13/2023   HGB 12.9 07/13/2023   HCT 41.0 07/13/2023   PLT 326.0 07/13/2023   GLUCOSE 114 (H) 01/10/2024   CHOL 159 01/10/2024   TRIG 93.0 01/10/2024   HDL 61.50 01/10/2024   LDLDIRECT 137.4 10/20/2009   LDLCALC 79 01/10/2024   ALT 14 01/10/2024   AST 17 01/10/2024   NA 140 01/10/2024   K 3.9 01/10/2024   CL 104 01/10/2024   CREATININE 1.05 01/10/2024   BUN 18 01/10/2024   CO2 30 01/10/2024   TSH 2.28 07/13/2023   HGBA1C 7.0 (H) 01/10/2024   MICROALBUR 10.2 (H) 01/30/2010   Assessment/Plan:  [name] is a 63 y.o. Black or African American [2] female with  has a past medical history of Allergic rhinitis, Anemia, Anxiety, Arthritis, Depression, Diabetes mellitus, Hyperlipidemia, and Hypertension.  Diabetes Lab Results  Component Value Date   HGBA1C 7.0 (H) 01/10/2024   uncontrolled, pt to continue current medical treatment farxiga  10  every day, ozempic  2 mg weekly , declines other change for now   Essential hypertension BP Readings from Last 3 Encounters:  01/10/24 126/88  12/29/23 120/80  08/30/23 120/82   Stable, pt to continue medical treatment norvasc  10 every day, lotensin  40 qd   Vitamin D  deficiency Last vitamin D  Lab Results  Component Value Date   VD25OH 29.99 (L) 01/10/2024   Low, to start oral replacement   Acute URI Mild to mod, for antibx course zpack, cough med prn,  to f/u any worsening symptoms or concerns  Wheezing Mild to mod, for contd inhaler prn and prednisone  taper,  to f/u any worsening symptoms or concerns  Followup: Return if symptoms worsen or fail to improve.  Lynwood Rush, MD 03/19/2024 6:54 PM [facility]

## 2024-03-19 NOTE — Assessment & Plan Note (Signed)
 Last vitamin D  Lab Results  Component Value Date   VD25OH 29.99 (L) 01/10/2024   Low, to start oral replacement

## 2024-03-19 NOTE — Assessment & Plan Note (Signed)
 Lab Results  Component Value Date   HGBA1C 7.0 (H) 01/10/2024   uncontrolled, pt to continue current medical treatment farxiga  10 every day, ozempic  2 mg weekly , declines other change for now

## 2024-03-19 NOTE — Assessment & Plan Note (Signed)
Mild to mod, for antibx course zpack, cough med prn,  to f/u any worsening symptoms or concerns 

## 2024-03-19 NOTE — Assessment & Plan Note (Signed)
 Mild to mod, for contd inhaler prn and prednisone  taper,  to f/u any worsening symptoms or concerns

## 2024-03-20 ENCOUNTER — Telehealth: Payer: Self-pay

## 2024-03-20 NOTE — Telephone Encounter (Signed)
 Copied from CRM [id number]. Topic: General - Other >> Mar 20, 2024  4:18 PM Drema MATSU wrote: Reason for CRM: Patient was seen on yesterday. She stated that the medficine is working but she is wanting a doctor  note for Wednesday and Thursday. She doesn't feel well and has been laying around.

## 2024-03-21 ENCOUNTER — Ambulatory Visit: Admitting: Internal Medicine

## 2024-03-21 ENCOUNTER — Ambulatory Visit: Payer: Self-pay

## 2024-03-21 NOTE — Telephone Encounter (Signed)
 Reason for Disposition . [1] Taking antibiotic < 72 hours (3 days) AND [2] symptoms are SAME (not improved)  Answer Assessment - Initial Assessment Questions 1. INFECTION: What infection is the antibiotic being given for?     Upper respiratory infection  2. ANTIBIOTIC: What antibiotic are you taking How many times per day?     Azithromycin   3. DURATION: When was the antibiotic started?     Started on 9/15 4. MAIN CONCERN OR SYMPTOM:  What is your main concern right now?     Headache and sinus pressure, still not feeling well enough for work  5. BETTER-SAME-WORSE: Are you getting better, staying the same, or getting worse compared to when you first started the antibiotics? If getting worse, ask: In what way?      Slightly better, cough improved, but still not well enough for work  6. FEVER: Do you have a fever? If Yes, ask: What is your temperature, how was it measured, and when did it start?     No  7. SYMPTOMS: Are there any other symptoms you're concerned about? If Yes, ask: When did it start?     Sinus headache and pressure  8. FOLLOW-UP APPOINTMENT: Do you have a follow-up appointment with your doctor?     No  9. PREGNANCY: Is there any chance you are pregnant? When was your last menstrual period?     no  Protocols used: Infection on Antibiotic Follow-up Call-[facility]

## 2024-03-21 NOTE — Telephone Encounter (Addendum)
 FYI Only or Action Required?: Action required by provider: request for documentation or forms and Patient requesting work note for Wednesday thru Friday while she continues abx and rest.  Patient was last seen in primary care on 03/19/2024 by Norleen Lynwood ORN, MD.  Called Nurse Triage reporting Sinusitis.  Symptoms began several days ago.  Interventions attempted: Prescription medications: azithromycin  and prednisone  and Rest, hydration, or home remedies.  Symptoms are: gradually improving.  Triage Disposition: Home Care  Patient/caregiver understands and will follow disposition?: yes- Patient is supposed to work at 3pm, requesting a work note to submit to have Wednesday thru Friday off  Copied from CRM #[id number]. Topic: Clinical - Red Word Triage >> Mar 21, 2024  8:38 AM Jessica Daniels wrote: Red Word that prompted transfer to Nurse Triage: Patient still not feeling well. Terrible headache and cough medicine making her sleep. Cough medicine is working. Answer Assessment - Initial Assessment Questions 1. LOCATION: Where does it hurt?      Front of head, gums, and side of jaw  2. ONSET: When did the sinus pain start?  (e.g., hours, days)      Started yesterday, but was diagnosed on 9/15 for URI  3. SEVERITY: How bad is the pain?   (Scale 0-10; or none, mild, moderate or severe)     *No Answer* 4. RECURRENT SYMPTOM: Have you ever had sinus problems before? If Yes, ask: When was the last time? and What happened that time?      *No Answer* 5. NASAL CONGESTION: Is the nose blocked? If Yes, ask: Can you open it or must you breathe through your mouth?     No  6. NASAL DISCHARGE: Do you have discharge from your nose? If so ask, What color?     No  7. FEVER: Do you have a fever? If Yes, ask: What is it, how was it measured, and when did it start?      No  8. OTHER SYMPTOMS: Do you have any other symptoms? (e.g., sore throat, cough, earache, difficulty breathing)      URI symptoms getting better 9. PREGNANCY: Is there any chance you are pregnant? When was your last menstrual period?     *No Answer*  Protocols used: Sinus Pain or Congestion-A-AH

## 2024-03-21 NOTE — Telephone Encounter (Signed)
Ok for work note as requested 

## 2024-03-22 NOTE — Telephone Encounter (Signed)
 Copied from CRM [id number]. Topic: General - Other >> Mar 21, 2024  3:10 PM Jessica Daniels wrote: Reason for CRM: pt called back in to check if work note is ready for pickup. She was advised earlier during a triage encounter that pcp would have it ready today. Please call and advise with an update for her.

## 2024-03-22 NOTE — Telephone Encounter (Signed)
 Copied from CRM [id number]. Topic: General - Other >> Mar 21, 2024  3:10 PM Taleah C wrote: Reason for CRM: pt called back in to check if work note is ready for pickup. She was advised earlier during a triage encounter that pcp would have it ready today. Please call and advise with an update for her. >> Mar 22, 2024 10:15 AM Chiquita SQUIBB wrote: Patient is calling in again asking if the note has been done for her to pickup. Please advise the patient.

## 2024-03-28 ENCOUNTER — Other Ambulatory Visit (HOSPITAL_COMMUNITY): Payer: Self-pay

## 2024-03-28 NOTE — Telephone Encounter (Signed)
 Has since been addressed in MyChart encounter

## 2024-04-23 DIAGNOSIS — M4316 Spondylolisthesis, lumbar region: Secondary | ICD-10-CM | POA: Insufficient documentation

## 2024-04-23 DIAGNOSIS — M47816 Spondylosis without myelopathy or radiculopathy, lumbar region: Secondary | ICD-10-CM | POA: Insufficient documentation

## 2024-05-21 ENCOUNTER — Telehealth: Payer: Self-pay

## 2024-05-21 ENCOUNTER — Other Ambulatory Visit (HOSPITAL_COMMUNITY): Payer: Self-pay

## 2024-05-21 NOTE — Telephone Encounter (Signed)
 Pharmacy Patient Advocate Encounter   Received notification from Patient Pharmacy that prior authorization for Ozempic  8mg /3ml is required/requested.   Insurance verification completed.   The patient is insured through [employer].   Per test claim: PA required; PA started via CoverMyMeds. KEY [id number] . Waiting for clinical questions to populate.

## 2024-05-23 ENCOUNTER — Other Ambulatory Visit (HOSPITAL_COMMUNITY): Payer: Self-pay

## 2024-05-23 NOTE — Telephone Encounter (Signed)
 Pharmacy Patient Advocate Encounter  Received notification from [facility] that Prior Authorization for Ozempic  8mg /49ml has been CANCELLED due to no additional PA is required   PA #/Case ID/Reference #: [id number]  Placed a call to [facility] and per the [facility] they limit the patients to one month at a time due to they have had a hard time getting the medication in.

## 2024-07-09 ENCOUNTER — Encounter: Payer: Self-pay | Admitting: Internal Medicine

## 2024-07-09 ENCOUNTER — Ambulatory Visit: Admitting: Internal Medicine

## 2024-07-09 ENCOUNTER — Ambulatory Visit: Payer: Self-pay | Admitting: Internal Medicine

## 2024-07-09 VITALS — BP 122/80 | HR 67 | Temp 98.6°F | Ht 67.0 in | Wt 212.0 lb

## 2024-07-09 DIAGNOSIS — R519 Headache, unspecified: Secondary | ICD-10-CM | POA: Diagnosis not present

## 2024-07-09 DIAGNOSIS — Z780 Asymptomatic menopausal state: Secondary | ICD-10-CM | POA: Insufficient documentation

## 2024-07-09 DIAGNOSIS — N1831 Chronic kidney disease, stage 3a: Secondary | ICD-10-CM

## 2024-07-09 DIAGNOSIS — I10 Essential (primary) hypertension: Secondary | ICD-10-CM

## 2024-07-09 DIAGNOSIS — E559 Vitamin D deficiency, unspecified: Secondary | ICD-10-CM

## 2024-07-09 DIAGNOSIS — E7800 Pure hypercholesterolemia, unspecified: Secondary | ICD-10-CM

## 2024-07-09 DIAGNOSIS — E669 Obesity, unspecified: Secondary | ICD-10-CM | POA: Insufficient documentation

## 2024-07-09 DIAGNOSIS — D259 Leiomyoma of uterus, unspecified: Secondary | ICD-10-CM | POA: Insufficient documentation

## 2024-07-09 DIAGNOSIS — E1122 Type 2 diabetes mellitus with diabetic chronic kidney disease: Secondary | ICD-10-CM | POA: Diagnosis not present

## 2024-07-09 LAB — LIPID PANEL
Cholesterol: 154 mg/dL (ref 28–200)
HDL: 58 mg/dL
LDL Cholesterol: 76 mg/dL (ref 10–99)
NonHDL: 96.03
Total CHOL/HDL Ratio: 3
Triglycerides: 98 mg/dL (ref 10.0–149.0)
VLDL: 19.6 mg/dL (ref 0.0–40.0)

## 2024-07-09 LAB — CBC WITH DIFFERENTIAL/PLATELET
Basophils Absolute: 0 K/uL (ref 0.0–0.1)
Basophils Relative: 0.7 % (ref 0.0–3.0)
Eosinophils Absolute: 0.1 K/uL (ref 0.0–0.7)
Eosinophils Relative: 1.2 % (ref 0.0–5.0)
HCT: 42.9 % (ref 36.0–46.0)
Hemoglobin: 13.4 g/dL (ref 12.0–15.0)
Lymphocytes Relative: 31 % (ref 12.0–46.0)
Lymphs Abs: 1.9 K/uL (ref 0.7–4.0)
MCHC: 31.2 g/dL (ref 30.0–36.0)
MCV: 69.6 fl — ABNORMAL LOW (ref 78.0–100.0)
Monocytes Absolute: 0.4 K/uL (ref 0.1–1.0)
Monocytes Relative: 7.2 % (ref 3.0–12.0)
Neutro Abs: 3.7 K/uL (ref 1.4–7.7)
Neutrophils Relative %: 59.9 % (ref 43.0–77.0)
Platelets: 343 K/uL (ref 150.0–400.0)
RBC: 6.17 Mil/uL — ABNORMAL HIGH (ref 3.87–5.11)
RDW: 15.7 % — ABNORMAL HIGH (ref 11.5–15.5)
WBC: 6.2 K/uL (ref 4.0–10.5)

## 2024-07-09 LAB — BASIC METABOLIC PANEL WITH GFR
BUN: 9 mg/dL (ref 6–23)
CO2: 33 meq/L — ABNORMAL HIGH (ref 19–32)
Calcium: 9.8 mg/dL (ref 8.4–10.5)
Chloride: 101 meq/L (ref 96–112)
Creatinine, Ser: 0.87 mg/dL (ref 0.40–1.20)
GFR: 71.75 mL/min
Glucose, Bld: 125 mg/dL — ABNORMAL HIGH (ref 70–99)
Potassium: 3.7 meq/L (ref 3.5–5.1)
Sodium: 140 meq/L (ref 135–145)

## 2024-07-09 LAB — HEPATIC FUNCTION PANEL
ALT: 19 U/L (ref 3–35)
AST: 22 U/L (ref 5–37)
Albumin: 4.7 g/dL (ref 3.5–5.2)
Alkaline Phosphatase: 137 U/L — ABNORMAL HIGH (ref 39–117)
Bilirubin, Direct: 0.1 mg/dL (ref 0.1–0.3)
Total Bilirubin: 0.4 mg/dL (ref 0.2–1.2)
Total Protein: 8.1 g/dL (ref 6.0–8.3)

## 2024-07-09 LAB — HEMOGLOBIN A1C: Hgb A1c MFr Bld: 7.2 % — ABNORMAL HIGH (ref 4.6–6.5)

## 2024-07-09 MED ORDER — RIZATRIPTAN BENZOATE 10 MG PO TBDP: 10.0000 mg | ORAL_TABLET | ORAL | 5 refills | Status: AC | PRN

## 2024-07-09 NOTE — Assessment & Plan Note (Signed)
 Possible atypical migraine but cannot r/o other - for MRI brain with increased freq and severity of HA

## 2024-07-09 NOTE — Assessment & Plan Note (Signed)
 Lab Results  Component Value Date   LDLCALC 76 07/09/2024   Stable, pt to continue current statin lipitor 20 mg qd

## 2024-07-09 NOTE — Assessment & Plan Note (Addendum)
 Ckd3a  Lab Results  Component Value Date   CREATININE 0.87 07/09/2024   Stable overall, cont to avoid nephrotoxins

## 2024-07-09 NOTE — Assessment & Plan Note (Signed)
 Last vitamin D  Lab Results  Component Value Date   VD25OH 29.99 (L) 01/10/2024   Low, to start oral replacement

## 2024-07-09 NOTE — Progress Notes (Signed)
 Patient ID: Jessica Daniels, female   DOB: [birth year], 64 y.o.   MRN: [id number]        Chief Complaint: follow up recurring headache, dm, htn, hld, low vit d      HPI:  Jessica Daniels is a 64 y.o. female here with c/o 1 mo worsening bifrontal and bilat occipital headaches now mod to severe and increased frequency, Maxalt  has helped in past but less recently.   Pt denies chest pain, increased sob or doe, wheezing, orthopnea, PND, increased LE swelling, palpitations, dizziness or syncope.   Pt denies polydipsia, polyuria, or new focal neuro s/s.           Wt Readings from Last 3 Encounters:  07/09/24 212 lb (96.2 kg)  03/19/24 207 lb (93.9 kg)  01/10/24 200 lb 9.6 oz (91 kg)   BP Readings from Last 3 Encounters:  07/09/24 122/80  01/10/24 126/88  12/29/23 120/80         Past Medical History:  Diagnosis Date   Allergic rhinitis    Anemia    Anxiety    Arthritis    Depression    Diabetes mellitus    Hyperlipidemia    Hypertension    Past Surgical History:  Procedure Laterality Date   CHOLECYSTECTOMY     LUMBAR LAMINECTOMY      reports that she has never smoked. She has never used smokeless tobacco. She reports that she does not drink alcohol and does not use drugs. family history includes Arthritis in her maternal grandmother; Cancer in her maternal aunt; Diabetes in her maternal aunt and another family member; Heart attack in her mother; Hypertension in her mother; Kidney disease in her mother; Lung cancer in her father. Allergies[1] Medications Ordered Prior to Encounter[2]      ROS:  All others reviewed and negative.  Objective        PE:  BP 122/80 (BP Location: Right Arm, Patient Position: Sitting, Cuff Size: Normal)   Pulse 67   Temp 98.6 F (37 C) (Oral)   Ht 5' 7 (1.702 m)   Wt 212 lb (96.2 kg)   LMP 08/06/2015   SpO2 99%   BMI 33.20 kg/m                 Constitutional: Pt appears in NAD               HENT: Head: NCAT.                Right Ear:  External ear normal.                 Left Ear: External ear normal.                Eyes: . Pupils are equal, round, and reactive to light. Conjunctivae and EOM are normal               Nose: without d/c or deformity               Neck: Neck supple. Gross normal ROM               Cardiovascular: Normal rate and regular rhythm.                 Pulmonary/Chest: Effort normal and breath sounds without rales or wheezing.                Abd:  Soft, NT, ND, + BS, no organomegaly  Neurological: Pt is alert. At baseline orientation, motor grossly intact               Skin: Skin is warm. No rashes, no other new lesions, LE edema - none               Psychiatric: Pt behavior is normal without agitation   Micro: none  Cardiac tracings I have personally interpreted today:  none  Pertinent Radiological findings (summarize): none   Lab Results  Component Value Date   WBC 6.2 07/09/2024   HGB 13.4 07/09/2024   HCT 42.9 07/09/2024   PLT 343.0 07/09/2024   GLUCOSE 125 (H) 07/09/2024   CHOL 154 07/09/2024   TRIG 98.0 07/09/2024   HDL 58.00 07/09/2024   LDLDIRECT 137.4 10/20/2009   LDLCALC 76 07/09/2024   ALT 19 07/09/2024   AST 22 07/09/2024   NA 140 07/09/2024   K 3.7 07/09/2024   CL 101 07/09/2024   CREATININE 0.87 07/09/2024   BUN 9 07/09/2024   CO2 33 (H) 07/09/2024   TSH 2.28 07/13/2023   HGBA1C 7.2 (H) 07/09/2024   MICROALBUR 10.2 (H) 01/30/2010   Assessment/Plan:  Jessica Daniels is a 64 y.o. Black or African American [2] female with  has a past medical history of Allergic rhinitis, Anemia, Anxiety, Arthritis, Depression, Diabetes mellitus, Hyperlipidemia, and Hypertension.  Diabetes (HCC) Ckd3a  Lab Results  Component Value Date   CREATININE 0.87 07/09/2024   Stable overall, cont to avoid nephrotoxins   Vitamin D  deficiency Last vitamin D  Lab Results  Component Value Date   VD25OH 29.99 (L) 01/10/2024   Low, to start oral  replacement   Hyperlipidemia Lab Results  Component Value Date   LDLCALC 76 07/09/2024   Stable, pt to continue current statin lipitor 20 mg qd   Essential hypertension BP Readings from Last 3 Encounters:  07/09/24 122/80  01/10/24 126/88  12/29/23 120/80   Stable, pt to continue medical treatment norvasc  10 every day, lotensin  40 mg qd   Severe frontal headaches Possible atypical migraine but cannot r/o other - for MRI brain with increased freq and severity of HA  Followup: Return in about 6 months (around 01/06/2025).  Lynwood Rush, MD 07/09/2024 9:16 PM [facility]     [1]  Allergies Allergen Reactions   Crestor [Rosuvastatin]     Elevated liver enzymes  [2]  Current Outpatient Medications on File Prior to Visit  Medication Sig Dispense Refill   PREVIDENT 5000 BOOSTER PLUS 1.1 % PSTE      albuterol  (VENTOLIN  HFA) 108 (90 Base) MCG/ACT inhaler Inhale 2 puffs into the lungs every 6 (six) hours as needed for wheezing or shortness of breath. (Patient not taking: Reported on 03/19/2024) 8 g 1   amLODipine  (NORVASC ) 10 MG tablet TAKE 1 TABLET(10 MG) BY MOUTH DAILY 90 tablet 3   atorvastatin  (LIPITOR) 20 MG tablet 1 tab by mouth once daily 90 tablet 3   benazepril  (LOTENSIN ) 40 MG tablet 1 Tablet by mouth daily. 90 tablet 3   dapagliflozin  propanediol (FARXIGA ) 10 MG TABS tablet Take 1 tablet (10 mg total) by mouth daily before breakfast. 90 tablet 3   fluticasone  (FLONASE ) 50 MCG/ACT nasal spray Place 2 sprays into both nostrils daily. 16 g 6   Multiple Vitamin (MULTI-VITAMIN) tablet Take 1 tablet by mouth daily. (Patient not taking: Reported on 03/19/2024)     RESTASIS 0.05 % ophthalmic emulsion 1 drop 2 (  two) times daily. (Patient not taking: Reported on 03/19/2024)     Semaglutide , 2 MG/DOSE, (OZEMPIC , 2 MG/DOSE,) 8 MG/3ML SOPN Inject 2 mg into the skin once a week. 9 mL 3   tiZANidine (ZANAFLEX) 4 MG tablet  TAKE 1/2 TABLET BY MOUTH THREE TIMES DAILY AS NEEDED FOR PAIN OR SPASMS     No current facility-administered medications on file prior to visit.

## 2024-07-09 NOTE — Patient Instructions (Addendum)
 Please continue all other medications as before, and refills have been done if requested.  Please have the pharmacy call with any other refills you may need.  Please continue your efforts at being more active, low cholesterol diet, and weight control.  You are otherwise up to date with prevention measures today.  Please keep your appointments with your specialists as you may have planned  You will be contacted regarding the referral for: MRI brain  Please go to the LAB at the blood drawing area for the tests to be done  You will be contacted by phone if any changes need to be made immediately.  Otherwise, you will receive a letter about your results with an explanation, but please check with MyChart first.  Please make an Appointment to return in 6 months, or sooner if needed,

## 2024-07-09 NOTE — Assessment & Plan Note (Signed)
 BP Readings from Last 3 Encounters:  07/09/24 122/80  01/10/24 126/88  12/29/23 120/80   Stable, pt to continue medical treatment norvasc  10 every day, lotensin  40 mg qd

## 2024-07-13 ENCOUNTER — Encounter: Admitting: Internal Medicine

## 2024-07-20 ENCOUNTER — Other Ambulatory Visit

## 2024-08-09 ENCOUNTER — Encounter: Payer: Self-pay | Admitting: Podiatry

## 2024-08-09 ENCOUNTER — Ambulatory Visit: Payer: Self-pay | Admitting: Podiatry

## 2024-08-09 DIAGNOSIS — L600 Ingrowing nail: Secondary | ICD-10-CM

## 2024-08-09 MED ORDER — MUPIROCIN 2 % EX OINT: 1.0000 | TOPICAL_OINTMENT | Freq: Two times a day (BID) | CUTANEOUS | 2 refills | Status: AC

## 2024-08-09 NOTE — Progress Notes (Unsigned)
 Ingrown bilateral medial border both toes 2-3 months Dm good pulses

## 2024-08-09 NOTE — Patient Instructions (Signed)

## 2024-08-30 ENCOUNTER — Ambulatory Visit: Admitting: Podiatry
